# Patient Record
Sex: Male | Born: 1950 | Race: Black or African American | Hispanic: No | Marital: Married | Smoking: Never smoker
Health system: Southern US, Community
[De-identification: ages and names within clinical notes are randomized; demographics above are authoritative.]

## PROBLEM LIST (undated history)

## (undated) DIAGNOSIS — N186 End stage renal disease: Secondary | ICD-10-CM

## (undated) DIAGNOSIS — I5022 Chronic systolic (congestive) heart failure: Secondary | ICD-10-CM

## (undated) DIAGNOSIS — E669 Obesity, unspecified: Secondary | ICD-10-CM

## (undated) DIAGNOSIS — E119 Type 2 diabetes mellitus without complications: Secondary | ICD-10-CM

## (undated) DIAGNOSIS — I2510 Atherosclerotic heart disease of native coronary artery without angina pectoris: Secondary | ICD-10-CM

## (undated) DIAGNOSIS — D472 Monoclonal gammopathy: Secondary | ICD-10-CM

## (undated) DIAGNOSIS — I509 Heart failure, unspecified: Secondary | ICD-10-CM

## (undated) DIAGNOSIS — J45909 Unspecified asthma, uncomplicated: Secondary | ICD-10-CM

## (undated) DIAGNOSIS — R0600 Dyspnea, unspecified: Secondary | ICD-10-CM

## (undated) DIAGNOSIS — F32A Depression, unspecified: Secondary | ICD-10-CM

## (undated) DIAGNOSIS — R609 Edema, unspecified: Secondary | ICD-10-CM

## (undated) DIAGNOSIS — Z992 Dependence on renal dialysis: Secondary | ICD-10-CM

## (undated) DIAGNOSIS — N184 Chronic kidney disease, stage 4 (severe): Secondary | ICD-10-CM

## (undated) HISTORY — DX: End stage renal disease: Z99.2

## (undated) HISTORY — DX: Atherosclerotic heart disease of native coronary artery without angina pectoris: I25.10

## (undated) HISTORY — DX: Type 2 diabetes mellitus without complications: E11.9

## (undated) HISTORY — PX: ABCESS DRAINAGE: SHX399

## (undated) HISTORY — DX: Chronic systolic (congestive) heart failure: I50.22

## (undated) HISTORY — DX: Obesity, unspecified: E66.9

## (undated) HISTORY — DX: Unspecified asthma, uncomplicated: J45.909

## (undated) HISTORY — DX: Heart failure, unspecified: I50.9

## (undated) HISTORY — DX: Monoclonal gammopathy: D47.2

## (undated) HISTORY — DX: End stage renal disease: N18.6

## (undated) HISTORY — DX: Edema, unspecified: R60.9

## (undated) HISTORY — PX: COLONOSCOPY: SHX174

---

## 2015-06-12 DIAGNOSIS — J4521 Mild intermittent asthma with (acute) exacerbation: Secondary | ICD-10-CM | POA: Diagnosis not present

## 2015-06-12 DIAGNOSIS — I10 Essential (primary) hypertension: Secondary | ICD-10-CM | POA: Diagnosis not present

## 2015-06-12 DIAGNOSIS — E1121 Type 2 diabetes mellitus with diabetic nephropathy: Secondary | ICD-10-CM | POA: Diagnosis not present

## 2015-06-12 DIAGNOSIS — Z6841 Body Mass Index (BMI) 40.0 and over, adult: Secondary | ICD-10-CM | POA: Diagnosis not present

## 2015-09-11 DIAGNOSIS — Z0000 Encounter for general adult medical examination without abnormal findings: Secondary | ICD-10-CM | POA: Diagnosis not present

## 2015-09-11 DIAGNOSIS — Z125 Encounter for screening for malignant neoplasm of prostate: Secondary | ICD-10-CM | POA: Diagnosis not present

## 2015-09-11 DIAGNOSIS — E0821 Diabetes mellitus due to underlying condition with diabetic nephropathy: Secondary | ICD-10-CM | POA: Diagnosis not present

## 2015-09-11 DIAGNOSIS — Z6839 Body mass index (BMI) 39.0-39.9, adult: Secondary | ICD-10-CM | POA: Diagnosis not present

## 2015-09-11 DIAGNOSIS — I10 Essential (primary) hypertension: Secondary | ICD-10-CM | POA: Diagnosis not present

## 2015-12-11 DIAGNOSIS — Z6841 Body Mass Index (BMI) 40.0 and over, adult: Secondary | ICD-10-CM | POA: Diagnosis not present

## 2015-12-11 DIAGNOSIS — E0821 Diabetes mellitus due to underlying condition with diabetic nephropathy: Secondary | ICD-10-CM | POA: Diagnosis not present

## 2015-12-11 DIAGNOSIS — I10 Essential (primary) hypertension: Secondary | ICD-10-CM | POA: Diagnosis not present

## 2015-12-11 DIAGNOSIS — M1009 Idiopathic gout, multiple sites: Secondary | ICD-10-CM | POA: Diagnosis not present

## 2015-12-16 DIAGNOSIS — L400 Psoriasis vulgaris: Secondary | ICD-10-CM | POA: Diagnosis not present

## 2015-12-19 DIAGNOSIS — E0821 Diabetes mellitus due to underlying condition with diabetic nephropathy: Secondary | ICD-10-CM | POA: Diagnosis not present

## 2015-12-19 DIAGNOSIS — Z6841 Body Mass Index (BMI) 40.0 and over, adult: Secondary | ICD-10-CM | POA: Diagnosis not present

## 2015-12-19 DIAGNOSIS — I10 Essential (primary) hypertension: Secondary | ICD-10-CM | POA: Diagnosis not present

## 2015-12-19 DIAGNOSIS — D1779 Benign lipomatous neoplasm of other sites: Secondary | ICD-10-CM | POA: Diagnosis not present

## 2016-02-05 DIAGNOSIS — I10 Essential (primary) hypertension: Secondary | ICD-10-CM | POA: Diagnosis not present

## 2016-02-05 DIAGNOSIS — E0821 Diabetes mellitus due to underlying condition with diabetic nephropathy: Secondary | ICD-10-CM | POA: Diagnosis not present

## 2016-03-05 DIAGNOSIS — I10 Essential (primary) hypertension: Secondary | ICD-10-CM | POA: Diagnosis not present

## 2016-03-05 DIAGNOSIS — E0821 Diabetes mellitus due to underlying condition with diabetic nephropathy: Secondary | ICD-10-CM | POA: Diagnosis not present

## 2016-03-12 DIAGNOSIS — M1009 Idiopathic gout, multiple sites: Secondary | ICD-10-CM | POA: Diagnosis not present

## 2016-03-12 DIAGNOSIS — E0821 Diabetes mellitus due to underlying condition with diabetic nephropathy: Secondary | ICD-10-CM | POA: Diagnosis not present

## 2016-03-12 DIAGNOSIS — I10 Essential (primary) hypertension: Secondary | ICD-10-CM | POA: Diagnosis not present

## 2016-03-12 DIAGNOSIS — Z6841 Body Mass Index (BMI) 40.0 and over, adult: Secondary | ICD-10-CM | POA: Diagnosis not present

## 2016-04-05 DIAGNOSIS — M1712 Unilateral primary osteoarthritis, left knee: Secondary | ICD-10-CM | POA: Diagnosis not present

## 2016-04-05 DIAGNOSIS — T560X1D Toxic effect of lead and its compounds, accidental (unintentional), subsequent encounter: Secondary | ICD-10-CM | POA: Diagnosis not present

## 2016-04-09 DIAGNOSIS — E0821 Diabetes mellitus due to underlying condition with diabetic nephropathy: Secondary | ICD-10-CM | POA: Diagnosis not present

## 2016-04-09 DIAGNOSIS — I10 Essential (primary) hypertension: Secondary | ICD-10-CM | POA: Diagnosis not present

## 2016-05-06 DIAGNOSIS — I10 Essential (primary) hypertension: Secondary | ICD-10-CM | POA: Diagnosis not present

## 2016-05-06 DIAGNOSIS — E0821 Diabetes mellitus due to underlying condition with diabetic nephropathy: Secondary | ICD-10-CM | POA: Diagnosis not present

## 2016-06-04 DIAGNOSIS — I10 Essential (primary) hypertension: Secondary | ICD-10-CM | POA: Diagnosis not present

## 2016-06-04 DIAGNOSIS — E0821 Diabetes mellitus due to underlying condition with diabetic nephropathy: Secondary | ICD-10-CM | POA: Diagnosis not present

## 2016-06-11 DIAGNOSIS — M1009 Idiopathic gout, multiple sites: Secondary | ICD-10-CM | POA: Diagnosis not present

## 2016-06-11 DIAGNOSIS — Z6841 Body Mass Index (BMI) 40.0 and over, adult: Secondary | ICD-10-CM | POA: Diagnosis not present

## 2016-06-11 DIAGNOSIS — E0821 Diabetes mellitus due to underlying condition with diabetic nephropathy: Secondary | ICD-10-CM | POA: Diagnosis not present

## 2016-06-11 DIAGNOSIS — I10 Essential (primary) hypertension: Secondary | ICD-10-CM | POA: Diagnosis not present

## 2016-06-29 DIAGNOSIS — R0602 Shortness of breath: Secondary | ICD-10-CM | POA: Diagnosis not present

## 2016-06-29 DIAGNOSIS — E784 Other hyperlipidemia: Secondary | ICD-10-CM | POA: Diagnosis not present

## 2016-06-29 DIAGNOSIS — J45998 Other asthma: Secondary | ICD-10-CM | POA: Diagnosis not present

## 2016-06-29 DIAGNOSIS — R079 Chest pain, unspecified: Secondary | ICD-10-CM | POA: Diagnosis not present

## 2016-06-29 DIAGNOSIS — M1049 Other secondary gout, multiple sites: Secondary | ICD-10-CM | POA: Diagnosis not present

## 2016-06-29 DIAGNOSIS — I2699 Other pulmonary embolism without acute cor pulmonale: Secondary | ICD-10-CM | POA: Diagnosis not present

## 2016-06-30 DIAGNOSIS — E1122 Type 2 diabetes mellitus with diabetic chronic kidney disease: Secondary | ICD-10-CM | POA: Diagnosis not present

## 2016-06-30 DIAGNOSIS — N183 Chronic kidney disease, stage 3 (moderate): Secondary | ICD-10-CM | POA: Diagnosis not present

## 2016-06-30 DIAGNOSIS — M109 Gout, unspecified: Secondary | ICD-10-CM | POA: Diagnosis not present

## 2016-06-30 DIAGNOSIS — M1049 Other secondary gout, multiple sites: Secondary | ICD-10-CM | POA: Diagnosis not present

## 2016-06-30 DIAGNOSIS — J45909 Unspecified asthma, uncomplicated: Secondary | ICD-10-CM | POA: Diagnosis not present

## 2016-06-30 DIAGNOSIS — I129 Hypertensive chronic kidney disease with stage 1 through stage 4 chronic kidney disease, or unspecified chronic kidney disease: Secondary | ICD-10-CM | POA: Diagnosis not present

## 2016-06-30 DIAGNOSIS — R0602 Shortness of breath: Secondary | ICD-10-CM | POA: Diagnosis not present

## 2016-06-30 DIAGNOSIS — I2699 Other pulmonary embolism without acute cor pulmonale: Secondary | ICD-10-CM | POA: Diagnosis not present

## 2016-06-30 DIAGNOSIS — E784 Other hyperlipidemia: Secondary | ICD-10-CM | POA: Diagnosis not present

## 2016-06-30 DIAGNOSIS — E785 Hyperlipidemia, unspecified: Secondary | ICD-10-CM | POA: Diagnosis not present

## 2016-06-30 DIAGNOSIS — R0789 Other chest pain: Secondary | ICD-10-CM | POA: Diagnosis not present

## 2016-06-30 DIAGNOSIS — Z79899 Other long term (current) drug therapy: Secondary | ICD-10-CM | POA: Diagnosis not present

## 2016-06-30 DIAGNOSIS — J45998 Other asthma: Secondary | ICD-10-CM | POA: Diagnosis not present

## 2016-06-30 DIAGNOSIS — Z7982 Long term (current) use of aspirin: Secondary | ICD-10-CM | POA: Diagnosis not present

## 2016-07-08 DIAGNOSIS — I2609 Other pulmonary embolism with acute cor pulmonale: Secondary | ICD-10-CM | POA: Diagnosis not present

## 2016-07-08 DIAGNOSIS — I10 Essential (primary) hypertension: Secondary | ICD-10-CM | POA: Diagnosis not present

## 2016-07-08 DIAGNOSIS — M1009 Idiopathic gout, multiple sites: Secondary | ICD-10-CM | POA: Diagnosis not present

## 2016-07-08 DIAGNOSIS — Z6838 Body mass index (BMI) 38.0-38.9, adult: Secondary | ICD-10-CM | POA: Diagnosis not present

## 2016-08-04 DIAGNOSIS — E0821 Diabetes mellitus due to underlying condition with diabetic nephropathy: Secondary | ICD-10-CM | POA: Diagnosis not present

## 2016-08-04 DIAGNOSIS — I10 Essential (primary) hypertension: Secondary | ICD-10-CM | POA: Diagnosis not present

## 2016-10-08 DIAGNOSIS — I2609 Other pulmonary embolism with acute cor pulmonale: Secondary | ICD-10-CM | POA: Diagnosis not present

## 2016-10-08 DIAGNOSIS — I10 Essential (primary) hypertension: Secondary | ICD-10-CM | POA: Diagnosis not present

## 2016-10-08 DIAGNOSIS — M1009 Idiopathic gout, multiple sites: Secondary | ICD-10-CM | POA: Diagnosis not present

## 2016-10-08 DIAGNOSIS — Z6838 Body mass index (BMI) 38.0-38.9, adult: Secondary | ICD-10-CM | POA: Diagnosis not present

## 2016-10-08 DIAGNOSIS — N528 Other male erectile dysfunction: Secondary | ICD-10-CM | POA: Diagnosis not present

## 2016-10-08 DIAGNOSIS — E1165 Type 2 diabetes mellitus with hyperglycemia: Secondary | ICD-10-CM | POA: Diagnosis not present

## 2016-11-30 DIAGNOSIS — I10 Essential (primary) hypertension: Secondary | ICD-10-CM | POA: Diagnosis not present

## 2016-11-30 DIAGNOSIS — E0821 Diabetes mellitus due to underlying condition with diabetic nephropathy: Secondary | ICD-10-CM | POA: Diagnosis not present

## 2016-12-31 DIAGNOSIS — N528 Other male erectile dysfunction: Secondary | ICD-10-CM | POA: Diagnosis not present

## 2016-12-31 DIAGNOSIS — I10 Essential (primary) hypertension: Secondary | ICD-10-CM | POA: Diagnosis not present

## 2016-12-31 DIAGNOSIS — N184 Chronic kidney disease, stage 4 (severe): Secondary | ICD-10-CM | POA: Diagnosis not present

## 2016-12-31 DIAGNOSIS — Z6838 Body mass index (BMI) 38.0-38.9, adult: Secondary | ICD-10-CM | POA: Diagnosis not present

## 2016-12-31 DIAGNOSIS — M1009 Idiopathic gout, multiple sites: Secondary | ICD-10-CM | POA: Diagnosis not present

## 2016-12-31 DIAGNOSIS — E1165 Type 2 diabetes mellitus with hyperglycemia: Secondary | ICD-10-CM | POA: Diagnosis not present

## 2017-01-05 DIAGNOSIS — I10 Essential (primary) hypertension: Secondary | ICD-10-CM | POA: Diagnosis not present

## 2017-01-05 DIAGNOSIS — E1165 Type 2 diabetes mellitus with hyperglycemia: Secondary | ICD-10-CM | POA: Diagnosis not present

## 2017-01-05 DIAGNOSIS — N184 Chronic kidney disease, stage 4 (severe): Secondary | ICD-10-CM | POA: Diagnosis not present

## 2017-01-28 DIAGNOSIS — M25561 Pain in right knee: Secondary | ICD-10-CM | POA: Diagnosis not present

## 2017-04-01 DIAGNOSIS — I10 Essential (primary) hypertension: Secondary | ICD-10-CM | POA: Diagnosis not present

## 2017-04-01 DIAGNOSIS — Z0000 Encounter for general adult medical examination without abnormal findings: Secondary | ICD-10-CM | POA: Diagnosis not present

## 2017-04-01 DIAGNOSIS — N184 Chronic kidney disease, stage 4 (severe): Secondary | ICD-10-CM | POA: Diagnosis not present

## 2017-04-01 DIAGNOSIS — E1165 Type 2 diabetes mellitus with hyperglycemia: Secondary | ICD-10-CM | POA: Diagnosis not present

## 2017-04-01 DIAGNOSIS — E6609 Other obesity due to excess calories: Secondary | ICD-10-CM | POA: Diagnosis not present

## 2017-04-01 DIAGNOSIS — Z6839 Body mass index (BMI) 39.0-39.9, adult: Secondary | ICD-10-CM | POA: Diagnosis not present

## 2017-04-01 DIAGNOSIS — Z1389 Encounter for screening for other disorder: Secondary | ICD-10-CM | POA: Diagnosis not present

## 2017-04-01 DIAGNOSIS — N528 Other male erectile dysfunction: Secondary | ICD-10-CM | POA: Diagnosis not present

## 2017-04-01 DIAGNOSIS — M1009 Idiopathic gout, multiple sites: Secondary | ICD-10-CM | POA: Diagnosis not present

## 2017-06-29 DIAGNOSIS — I10 Essential (primary) hypertension: Secondary | ICD-10-CM | POA: Diagnosis not present

## 2017-06-29 DIAGNOSIS — E119 Type 2 diabetes mellitus without complications: Secondary | ICD-10-CM | POA: Diagnosis not present

## 2017-06-29 DIAGNOSIS — E6609 Other obesity due to excess calories: Secondary | ICD-10-CM | POA: Diagnosis not present

## 2017-06-29 DIAGNOSIS — N528 Other male erectile dysfunction: Secondary | ICD-10-CM | POA: Diagnosis not present

## 2017-06-29 DIAGNOSIS — N184 Chronic kidney disease, stage 4 (severe): Secondary | ICD-10-CM | POA: Diagnosis not present

## 2017-06-29 DIAGNOSIS — M1009 Idiopathic gout, multiple sites: Secondary | ICD-10-CM | POA: Diagnosis not present

## 2017-06-29 DIAGNOSIS — Z6839 Body mass index (BMI) 39.0-39.9, adult: Secondary | ICD-10-CM | POA: Diagnosis not present

## 2017-08-19 DIAGNOSIS — M79641 Pain in right hand: Secondary | ICD-10-CM | POA: Diagnosis not present

## 2017-08-31 DIAGNOSIS — M79672 Pain in left foot: Secondary | ICD-10-CM | POA: Diagnosis not present

## 2017-08-31 DIAGNOSIS — R7989 Other specified abnormal findings of blood chemistry: Secondary | ICD-10-CM | POA: Diagnosis not present

## 2017-08-31 DIAGNOSIS — N2889 Other specified disorders of kidney and ureter: Secondary | ICD-10-CM | POA: Diagnosis not present

## 2017-08-31 DIAGNOSIS — E1122 Type 2 diabetes mellitus with diabetic chronic kidney disease: Secondary | ICD-10-CM | POA: Diagnosis not present

## 2017-08-31 DIAGNOSIS — R1011 Right upper quadrant pain: Secondary | ICD-10-CM | POA: Diagnosis not present

## 2017-08-31 DIAGNOSIS — E270 Other adrenocortical overactivity: Secondary | ICD-10-CM | POA: Diagnosis not present

## 2017-08-31 DIAGNOSIS — M109 Gout, unspecified: Secondary | ICD-10-CM | POA: Diagnosis not present

## 2017-08-31 DIAGNOSIS — M1009 Idiopathic gout, multiple sites: Secondary | ICD-10-CM | POA: Diagnosis not present

## 2017-08-31 DIAGNOSIS — R109 Unspecified abdominal pain: Secondary | ICD-10-CM | POA: Diagnosis not present

## 2017-08-31 DIAGNOSIS — M7989 Other specified soft tissue disorders: Secondary | ICD-10-CM | POA: Diagnosis not present

## 2017-08-31 DIAGNOSIS — I161 Hypertensive emergency: Secondary | ICD-10-CM | POA: Diagnosis not present

## 2017-09-01 DIAGNOSIS — E279 Disorder of adrenal gland, unspecified: Secondary | ICD-10-CM | POA: Diagnosis not present

## 2017-09-01 DIAGNOSIS — E785 Hyperlipidemia, unspecified: Secondary | ICD-10-CM | POA: Diagnosis not present

## 2017-09-01 DIAGNOSIS — M7989 Other specified soft tissue disorders: Secondary | ICD-10-CM | POA: Diagnosis not present

## 2017-09-01 DIAGNOSIS — Z79899 Other long term (current) drug therapy: Secondary | ICD-10-CM | POA: Diagnosis not present

## 2017-09-01 DIAGNOSIS — R109 Unspecified abdominal pain: Secondary | ICD-10-CM | POA: Diagnosis not present

## 2017-09-01 DIAGNOSIS — Z6839 Body mass index (BMI) 39.0-39.9, adult: Secondary | ICD-10-CM | POA: Diagnosis not present

## 2017-09-01 DIAGNOSIS — E1122 Type 2 diabetes mellitus with diabetic chronic kidney disease: Secondary | ICD-10-CM | POA: Diagnosis not present

## 2017-09-01 DIAGNOSIS — E270 Other adrenocortical overactivity: Secondary | ICD-10-CM | POA: Diagnosis not present

## 2017-09-01 DIAGNOSIS — Z86711 Personal history of pulmonary embolism: Secondary | ICD-10-CM | POA: Diagnosis not present

## 2017-09-01 DIAGNOSIS — M79672 Pain in left foot: Secondary | ICD-10-CM | POA: Diagnosis not present

## 2017-09-01 DIAGNOSIS — Z7982 Long term (current) use of aspirin: Secondary | ICD-10-CM | POA: Diagnosis not present

## 2017-09-01 DIAGNOSIS — Z794 Long term (current) use of insulin: Secondary | ICD-10-CM | POA: Diagnosis not present

## 2017-09-01 DIAGNOSIS — M109 Gout, unspecified: Secondary | ICD-10-CM | POA: Diagnosis not present

## 2017-09-01 DIAGNOSIS — I129 Hypertensive chronic kidney disease with stage 1 through stage 4 chronic kidney disease, or unspecified chronic kidney disease: Secondary | ICD-10-CM | POA: Diagnosis not present

## 2017-09-01 DIAGNOSIS — I161 Hypertensive emergency: Secondary | ICD-10-CM | POA: Diagnosis not present

## 2017-09-01 DIAGNOSIS — Z7901 Long term (current) use of anticoagulants: Secondary | ICD-10-CM | POA: Diagnosis not present

## 2017-09-01 DIAGNOSIS — J4540 Moderate persistent asthma, uncomplicated: Secondary | ICD-10-CM | POA: Diagnosis not present

## 2017-09-01 DIAGNOSIS — R7989 Other specified abnormal findings of blood chemistry: Secondary | ICD-10-CM | POA: Diagnosis not present

## 2017-09-01 DIAGNOSIS — N2889 Other specified disorders of kidney and ureter: Secondary | ICD-10-CM | POA: Diagnosis not present

## 2017-09-01 DIAGNOSIS — N183 Chronic kidney disease, stage 3 (moderate): Secondary | ICD-10-CM | POA: Diagnosis not present

## 2017-09-01 DIAGNOSIS — M1009 Idiopathic gout, multiple sites: Secondary | ICD-10-CM | POA: Diagnosis not present

## 2017-09-05 DIAGNOSIS — I10 Essential (primary) hypertension: Secondary | ICD-10-CM | POA: Diagnosis not present

## 2017-09-05 DIAGNOSIS — Z6839 Body mass index (BMI) 39.0-39.9, adult: Secondary | ICD-10-CM | POA: Diagnosis not present

## 2017-09-08 ENCOUNTER — Other Ambulatory Visit: Payer: Self-pay

## 2017-09-08 NOTE — Patient Outreach (Signed)
[facility]) Care Management  09/08/2017  Gemini Beaumier [birth year] [id number]  Transition of care  Referral date: 09/07/17 Referral source: discharged from [facility] on 09/03/17 Insurance: [employer]  Telephone call to patient regarding transition of care referral. HIPAA verified with patient.  Explained reason for call. Patient states he was recently in the hospital due to sharp pain in his right side. Patient states he had a follow up appointment with his primary MD on 09/05/17. States his doctor went over his medications with him.   Patient states his doctor is waiting for his test result to come in then he will schedule another follow up visit.  Patient states he is doing ok at this time.   Patient states he has transportation to his appointments.  RNCM advised patient to take his medications as prescribed.  RNCM advised patient to notify MD of any changes in condition prior to scheduled appointment. RNCM provided contact name and number: [phone] or main office number [phone] and 24 hour nurse advise line [phone].  RNCM verified patient aware of 911 services for urgent/ emergent needs., Patient denies need for ongoing transition of care follow up.    PLAN:   RNCM will close patient due to refusal of services.  RNCM will send patients primary MD closure notification  RNCM will send patient [facility] care management brochure  Quinn Plowman RN,BSN,CCM [facility] Telephonic  [phone]

## 2017-09-28 DIAGNOSIS — Z6837 Body mass index (BMI) 37.0-37.9, adult: Secondary | ICD-10-CM | POA: Diagnosis not present

## 2017-09-28 DIAGNOSIS — I10 Essential (primary) hypertension: Secondary | ICD-10-CM | POA: Diagnosis not present

## 2017-09-28 DIAGNOSIS — D1779 Benign lipomatous neoplasm of other sites: Secondary | ICD-10-CM | POA: Diagnosis not present

## 2017-09-28 DIAGNOSIS — I2609 Other pulmonary embolism with acute cor pulmonale: Secondary | ICD-10-CM | POA: Diagnosis not present

## 2017-09-28 DIAGNOSIS — N183 Chronic kidney disease, stage 3 (moderate): Secondary | ICD-10-CM | POA: Diagnosis not present

## 2017-09-28 DIAGNOSIS — E1121 Type 2 diabetes mellitus with diabetic nephropathy: Secondary | ICD-10-CM | POA: Diagnosis not present

## 2017-10-03 DIAGNOSIS — R1084 Generalized abdominal pain: Secondary | ICD-10-CM | POA: Diagnosis not present

## 2017-10-03 DIAGNOSIS — E278 Other specified disorders of adrenal gland: Secondary | ICD-10-CM | POA: Diagnosis not present

## 2017-10-03 DIAGNOSIS — R1011 Right upper quadrant pain: Secondary | ICD-10-CM | POA: Diagnosis not present

## 2017-10-03 DIAGNOSIS — N289 Disorder of kidney and ureter, unspecified: Secondary | ICD-10-CM | POA: Diagnosis not present

## 2017-10-12 DIAGNOSIS — Z6838 Body mass index (BMI) 38.0-38.9, adult: Secondary | ICD-10-CM | POA: Diagnosis not present

## 2017-10-12 DIAGNOSIS — M150 Primary generalized (osteo)arthritis: Secondary | ICD-10-CM | POA: Diagnosis not present

## 2017-10-12 DIAGNOSIS — L409 Psoriasis, unspecified: Secondary | ICD-10-CM | POA: Diagnosis not present

## 2017-10-12 DIAGNOSIS — M2550 Pain in unspecified joint: Secondary | ICD-10-CM | POA: Diagnosis not present

## 2017-10-12 DIAGNOSIS — E669 Obesity, unspecified: Secondary | ICD-10-CM | POA: Diagnosis not present

## 2017-10-12 DIAGNOSIS — M1A9XX1 Chronic gout, unspecified, with tophus (tophi): Secondary | ICD-10-CM | POA: Diagnosis not present

## 2017-10-17 DIAGNOSIS — I10 Essential (primary) hypertension: Secondary | ICD-10-CM | POA: Diagnosis not present

## 2017-10-17 DIAGNOSIS — E1121 Type 2 diabetes mellitus with diabetic nephropathy: Secondary | ICD-10-CM | POA: Diagnosis not present

## 2017-11-11 DIAGNOSIS — M1A9XX1 Chronic gout, unspecified, with tophus (tophi): Secondary | ICD-10-CM | POA: Diagnosis not present

## 2017-12-02 DIAGNOSIS — M25562 Pain in left knee: Secondary | ICD-10-CM | POA: Diagnosis not present

## 2017-12-12 DIAGNOSIS — E669 Obesity, unspecified: Secondary | ICD-10-CM | POA: Diagnosis not present

## 2017-12-12 DIAGNOSIS — Z79899 Other long term (current) drug therapy: Secondary | ICD-10-CM | POA: Diagnosis not present

## 2017-12-12 DIAGNOSIS — M150 Primary generalized (osteo)arthritis: Secondary | ICD-10-CM | POA: Diagnosis not present

## 2017-12-12 DIAGNOSIS — M2550 Pain in unspecified joint: Secondary | ICD-10-CM | POA: Diagnosis not present

## 2017-12-12 DIAGNOSIS — Z6838 Body mass index (BMI) 38.0-38.9, adult: Secondary | ICD-10-CM | POA: Diagnosis not present

## 2017-12-12 DIAGNOSIS — M1A9XX1 Chronic gout, unspecified, with tophus (tophi): Secondary | ICD-10-CM | POA: Diagnosis not present

## 2017-12-12 DIAGNOSIS — L409 Psoriasis, unspecified: Secondary | ICD-10-CM | POA: Diagnosis not present

## 2017-12-29 DIAGNOSIS — Z6837 Body mass index (BMI) 37.0-37.9, adult: Secondary | ICD-10-CM | POA: Diagnosis not present

## 2017-12-29 DIAGNOSIS — N183 Chronic kidney disease, stage 3 (moderate): Secondary | ICD-10-CM | POA: Diagnosis not present

## 2017-12-29 DIAGNOSIS — Z0000 Encounter for general adult medical examination without abnormal findings: Secondary | ICD-10-CM | POA: Diagnosis not present

## 2017-12-29 DIAGNOSIS — E1121 Type 2 diabetes mellitus with diabetic nephropathy: Secondary | ICD-10-CM | POA: Diagnosis not present

## 2017-12-29 DIAGNOSIS — I2609 Other pulmonary embolism with acute cor pulmonale: Secondary | ICD-10-CM | POA: Diagnosis not present

## 2017-12-29 DIAGNOSIS — I10 Essential (primary) hypertension: Secondary | ICD-10-CM | POA: Diagnosis not present

## 2017-12-29 DIAGNOSIS — D1779 Benign lipomatous neoplasm of other sites: Secondary | ICD-10-CM | POA: Diagnosis not present

## 2018-01-10 DIAGNOSIS — M1A9XX1 Chronic gout, unspecified, with tophus (tophi): Secondary | ICD-10-CM | POA: Diagnosis not present

## 2018-02-09 DIAGNOSIS — I10 Essential (primary) hypertension: Secondary | ICD-10-CM | POA: Diagnosis not present

## 2018-02-09 DIAGNOSIS — E1121 Type 2 diabetes mellitus with diabetic nephropathy: Secondary | ICD-10-CM | POA: Diagnosis not present

## 2018-03-14 DIAGNOSIS — M150 Primary generalized (osteo)arthritis: Secondary | ICD-10-CM | POA: Diagnosis not present

## 2018-03-14 DIAGNOSIS — Z79899 Other long term (current) drug therapy: Secondary | ICD-10-CM | POA: Diagnosis not present

## 2018-03-14 DIAGNOSIS — L409 Psoriasis, unspecified: Secondary | ICD-10-CM | POA: Diagnosis not present

## 2018-03-14 DIAGNOSIS — M1A9XX1 Chronic gout, unspecified, with tophus (tophi): Secondary | ICD-10-CM | POA: Diagnosis not present

## 2018-03-14 DIAGNOSIS — M2550 Pain in unspecified joint: Secondary | ICD-10-CM | POA: Diagnosis not present

## 2018-03-14 DIAGNOSIS — Z6841 Body Mass Index (BMI) 40.0 and over, adult: Secondary | ICD-10-CM | POA: Diagnosis not present

## 2018-03-23 DIAGNOSIS — E1121 Type 2 diabetes mellitus with diabetic nephropathy: Secondary | ICD-10-CM | POA: Diagnosis not present

## 2018-03-23 DIAGNOSIS — I10 Essential (primary) hypertension: Secondary | ICD-10-CM | POA: Diagnosis not present

## 2018-04-03 DIAGNOSIS — Z0000 Encounter for general adult medical examination without abnormal findings: Secondary | ICD-10-CM | POA: Diagnosis not present

## 2018-04-03 DIAGNOSIS — I2690 Septic pulmonary embolism without acute cor pulmonale: Secondary | ICD-10-CM | POA: Diagnosis not present

## 2018-04-03 DIAGNOSIS — N183 Chronic kidney disease, stage 3 (moderate): Secondary | ICD-10-CM | POA: Diagnosis not present

## 2018-04-03 DIAGNOSIS — Z6841 Body Mass Index (BMI) 40.0 and over, adult: Secondary | ICD-10-CM | POA: Diagnosis not present

## 2018-04-03 DIAGNOSIS — Z1389 Encounter for screening for other disorder: Secondary | ICD-10-CM | POA: Diagnosis not present

## 2018-04-03 DIAGNOSIS — I10 Essential (primary) hypertension: Secondary | ICD-10-CM | POA: Diagnosis not present

## 2018-04-03 DIAGNOSIS — E1121 Type 2 diabetes mellitus with diabetic nephropathy: Secondary | ICD-10-CM | POA: Diagnosis not present

## 2018-04-24 ENCOUNTER — Encounter: Payer: Self-pay | Admitting: Internal Medicine

## 2018-04-24 ENCOUNTER — Ambulatory Visit: Payer: PPO | Admitting: Internal Medicine

## 2018-04-24 VITALS — BP 168/82 | HR 84 | Ht 72.0 in | Wt 302.4 lb

## 2018-04-24 DIAGNOSIS — I10 Essential (primary) hypertension: Secondary | ICD-10-CM

## 2018-04-24 DIAGNOSIS — R0609 Other forms of dyspnea: Secondary | ICD-10-CM

## 2018-04-24 DIAGNOSIS — I2699 Other pulmonary embolism without acute cor pulmonale: Secondary | ICD-10-CM | POA: Diagnosis not present

## 2018-04-24 MED ORDER — BISOPROLOL FUMARATE 5 MG PO TABS: ORAL_TABLET | ORAL | 11 refills | Status: DC

## 2018-04-24 NOTE — Assessment & Plan Note (Addendum)
Body mass index is 41.01 kg/m.   No results found for: TSH   Contributing to gerd risk/ doe/reviewed the need and the process to achieve and maintain neg calorie balance > defer f/u primary care including intermittently monitoring thyroid status     Total time devoted to counseling  > 50 % of initial 60 min office visit:  review case with pt/directly observed amb 02 sat study/  discussion of options/alternatives/ personally creating written customized instructions  in presence of pt  then going over those specific  Instructions directly with the pt including how to use all of the meds but in particular covering each new medication in detail and the difference between the maintenance= "automatic" meds and the prns using an action plan format for the latter (If this problem/symptom => do that organization reading Left to right).  Please see AVS from this visit for a full list of these instructions which I personally wrote for this pt and  are unique to this visit.

## 2018-04-24 NOTE — Progress Notes (Signed)
[name] [name], male    DOB: Sep 21, 1950,    MRN: [id number]    Brief patient profile:  45 yobm never smoker  With asthma as child never able to play to hs sports maint on advair as adult and obesity complicated by dm/ hyperlipidemia/hbp plus  problem with with L >R leg swelling x decades then pain in L calf 06/2016 then 4 days later sweating / cp/sob when went to resume Y >   admit [facility] dx L dvt / PE and doe ever since s improvement so referred to pulmonary clinic 04/24/2018 by Dr   Telford Nab   History of Present Illness  04/24/2018  Pulmonary/ 1st office eval/  Chief Complaint  Patient presents with  . Pulmonary Consult    Referred by Dr. Clide Deutscher.  Pt c/o SOB since dxed with PE in March 2018.  He gets winded walking approx 40 yards.  Dyspnea:  70 ft from kitchen door uphill to mb now has to go slower than used to no change in wt sinece  Cough: no Sleep: on L side / flat bed/ sleeps on 30 degree wedge  SABA use: no inhalers   No obvious day to day or daytime variability or assoc excess/ purulent sputum or mucus plugs or hemoptysis or cp or chest tightness, subjective wheeze or overt sinus or hb symptoms.   Sleeping as above  without nocturnal  or early am exacerbation  of respiratory  c/o's or need for noct saba. Also denies any obvious fluctuation of symptoms with weather or environmental changes or other aggravating or alleviating factors except as outlined above   No unusual exposure hx or h/o childhood pna  or knowledge of premature birth.  Current Allergies, Complete Past Medical History, Past Surgical History, Family History, and Social History were reviewed in [employer] record.  ROS  The following are not active complaints unless bolded Hoarseness, sore throat, dysphagia, dental problems, itching, sneezing,  nasal congestion or discharge of excess mucus or purulent secretions, ear ache,   fever, chills, sweats, unintended wt loss or wt gain,  classically pleuritic or exertional cp,  orthopnea pnd or arm/hand swelling  or leg swelling, presyncope, palpitations, abdominal pain, anorexia, nausea, vomiting, diarrhea  or change in bowel habits or change in bladder habits, change in stools or change in urine, dysuria, hematuria,  rash, arthralgias, visual complaints, headache, numbness, weakness or ataxia or problems with walking or coordination,  change in mood or  memory.            No past medical history on file.  Outpatient Medications Prior to Visit  Medication Sig Dispense Refill  . allopurinol (ZYLOPRIM) 300 MG tablet Take 300 mg by mouth daily.    Marland Kitchen apixaban (ELIQUIS) 2.5 MG TABS tablet Take 2.5 mg by mouth 2 (two) times daily.    Marland Kitchen atorvastatin (LIPITOR) 20 MG tablet Take 20 mg by mouth daily.    . chlorthalidone (HYGROTON) 25 MG tablet Take 25 mg by mouth daily.    . empagliflozin (JARDIANCE) 25 MG TABS tablet Take 25 mg by mouth daily.    . Fluticasone-Salmeterol (ADVAIR) 250-50 MCG/DOSE AEPB Inhale 1 puff into the lungs 2 (two) times daily.    Marland Kitchen glimepiride (AMARYL) 4 MG tablet Take 4 mg by mouth daily with breakfast.    . hydrALAZINE (APRESOLINE) 50 MG tablet Take 50 mg by mouth. Patient states he takes 1 tablet 2 times per day    . insulin aspart (NOVOLOG)  100 UNIT/ML injection Inject into the skin 3 (three) times daily with meals. Patient states takes 5 units before meals    . insulin detemir (LEVEMIR) 100 UNIT/ML injection Inject into the skin at bedtime. Patient states he takes 10 units at bedtime.    . isosorbide mononitrate (IMDUR) 30 MG 24 hr tablet Take 30 mg by mouth daily.    Marland Kitchen labetalol (NORMODYNE) 300 MG tablet Take 300 mg by mouth 2 (two) times daily.    . Omega-3 Fatty Acids (FISH OIL) 1200 MG CAPS Take 1,200 mg by mouth. Patient states 1 tablet 1 time per day    . potassium chloride SA (K-DUR,KLOR-CON) 20 MEQ tablet Take 20 mEq by mouth daily.    . colchicine 0.6 MG tablet Take 0.6 mg by mouth daily. As  needed for gout        Objective:     BP (!) 168/82 (BP Location: Left Arm, Cuff Size: Normal)   Pulse 84   Ht 6' (1.829 m)   Wt (!) 302 lb 6.4 oz (137.2 kg)   SpO2 97%   BMI 41.01 kg/m   SpO2: 97 %  RA   Amb bm mild pseudowheeze on fvc, eliminated with plb     HEENT: Partial plates , turbinates bilaterally, and oropharynx. Nl external ear canals without cough reflex   NECK :  without JVD/Nodes/TM/ nl carotid upstrokes bilaterally   LUNGS: no acc muscle use,  Nl contour chest which is clear to A and P bilaterally without cough on insp or exp maneuvers   CV:  RRR  no s3 or murmur or increase in P2, and massive swelling both LEs L >R   ABD:  Quite obese but  soft and nontender with nl inspiratory excursion in the supine position. No bruits or organomegaly appreciated, bowel sounds nl  MS:  Nl gait/ ext warm without deformities, calf tenderness, cyanosis or clubbing No obvious joint restrictions   SKIN: warm and dry with elephantiasis skin changes bilateral LE's     NEURO:  alert, approp, nl sensorium with  no motor or cerebellar deficits apparent.         Assessment  DOE (dyspnea on exertion) 04/24/2018   Walked RA  2 laps @ 23ft each @ avg  pace  stopped due to  End of study,  Min sob, no desats  - Spirometry 04/24/2018  FEV1 2.2 (70%)  Ratio 72 mild curvature p am advair 250 while on normodyne    Symptoms are  disproportionate to objective findings and not clear to what extent this is actually a pulmonary  problem but pt does appear to have difficult to sort out respiratory symptoms of unknown origin for which  DDX  = almost all start with A and  include Adherence, Ace Inhibitors, Acid Reflux, Active Sinus Disease, Alpha 1 Antitripsin deficiency, Anxiety masquerading as Airways dz,  ABPA,  Allergy(esp in young), Aspiration (esp in elderly), Adverse effects of meds,  Active smoking or Vaping, A bunch of PE's/clot burden (a few small clots can't cause this syndrome  unless there is already severe underlying pulm or vascular dz with poor reserve),  Anemia or thyroid disorder, plus two Bs  = Bronchiectasis and Beta blocker use..and one C= CHF   Adherence is always the initial "prime suspect" and is a multilayered concern that requires a "trust but verify" approach in every patient - starting with knowing how to use medications, especially inhalers, correctly, keeping up with refills and understanding the fundamental  difference between maintenance and prns vs those medications only taken for a very short course and then stopped and not refilled.  - rec return with all meds in hand using a trust but verify approach to confirm accurate Medication  Reconciliation The principal here is that until we are certain that the  patients are doing what we've asked, it makes no sense to ask them to do more.   ? Adverse effects of advair > consider hfa on return if not improving off normodyne  ? Allergy/ asthma >  No nocturnal symptoms or assoc rhinitis but need to keep in ddx if becomes refractory to suggestions  ? Beta blocker effects > see hbp  ? A bunch of PE's > continue eliquis but recheck LE dopplers  ? chf / ? TEPAH > check echo      Essential hypertension In the setting of respiratory symptoms of unknown etiology,  It would be preferable to use bystolic, the most beta -1  selective Beta blocker available in sample form, with bisoprolol the most selective generic choice  on the market, at least on a trial basis, to make sure the spillover Beta 2 effects of the less specific Beta blockers are not contributing to this patient's symptoms.   >>> His bp is not really optimally  controlled anyway so rec change normodyne to bisoprolol 5 mg bid and f/u with PCP rec      Pulmonary embolism ([place]) Dx 06/2016  [facility]   Main risk factors are MO and chronic venous insufficiency .  If echo nl and no evidence of dvt rec continue the lower dose indefinitely but  keep in mind that pts in this wt range were excluded from the studies so low threshold to change to the higher dose DOAC or coumadin if any evidence at all to support  ongoing or recurrent clotting disoder here.  >>>   recheck venous dopplers/ echo     Morbid obesity due to excess calories ([place]) Body mass index is 41.01 kg/m.    No results found for: TSH   Contributing to gerd risk/ doe/reviewed the need and the process to achieve and maintain neg calorie balance > defer f/u primary care including intermittently monitoring thyroid status       Total time devoted to counseling  > 50 % of initial 60 min office visit:  review case with pt/directly observed amb 02 sat study/  discussion of options/alternatives/ personally creating written customized instructions  in presence of pt  then going over those specific  Instructions directly with the pt including how to use all of the meds but in particular covering each new medication in detail and the difference between the maintenance= "automatic" meds and the prns using an action plan format for the latter (If this problem/symptom => do that organization reading Left to right).  Please see AVS from this visit for a full list of these instructions which I personally wrote for this pt and  are unique to this visit.     Christinia Gully, MD 04/24/2018

## 2018-04-24 NOTE — Patient Instructions (Signed)
Stop normodyne  (labetolol) and start bisoprolol 5 mg twice daily instead   No other changes in medications   Please see patient coordinator before you leave today  to schedule venous dopplers and echo at [facility] office and our cardiologists can read them and send them to me and I'll call with results   Please schedule a follow up office visit in 4 weeks, sooner if needed with pfts on return

## 2018-04-25 ENCOUNTER — Encounter: Payer: Self-pay | Admitting: Internal Medicine

## 2018-04-25 LAB — CBC WITH DIFFERENTIAL/PLATELET
Basophils Absolute: 0 10*3/uL (ref 0.0–0.1)
Basophils Relative: 1 % (ref 0.0–3.0)
EOS ABS: 0.3 10*3/uL (ref 0.0–0.7)
Eosinophils Relative: 5.2 % — ABNORMAL HIGH (ref 0.0–5.0)
HCT: 38.5 % — ABNORMAL LOW (ref 39.0–52.0)
Hemoglobin: 12.3 g/dL — ABNORMAL LOW (ref 13.0–17.0)
LYMPHS ABS: 1.4 10*3/uL (ref 0.7–4.0)
Lymphocytes Relative: 27.4 % (ref 12.0–46.0)
MCHC: 32 g/dL (ref 30.0–36.0)
MCV: 90 fl (ref 78.0–100.0)
MONO ABS: 0.4 10*3/uL (ref 0.1–1.0)
Monocytes Relative: 7.4 % (ref 3.0–12.0)
Neutro Abs: 3 10*3/uL (ref 1.4–7.7)
Neutrophils Relative %: 59 % (ref 43.0–77.0)
Platelets: 221 10*3/uL (ref 150.0–400.0)
RBC: 4.28 Mil/uL (ref 4.22–5.81)
RDW: 16.7 % — ABNORMAL HIGH (ref 11.5–15.5)
WBC: 5 10*3/uL (ref 4.0–10.5)

## 2018-04-25 LAB — BASIC METABOLIC PANEL
BUN: 60 mg/dL — AB (ref 6–23)
CO2: 25 mEq/L (ref 19–32)
Calcium: 9.5 mg/dL (ref 8.4–10.5)
Chloride: 106 mEq/L (ref 96–112)
Creatinine, Ser: 2.51 mg/dL — ABNORMAL HIGH (ref 0.40–1.50)
GFR: 33.04 mL/min — ABNORMAL LOW (ref >60.00)
Glucose, Bld: 112 mg/dL — ABNORMAL HIGH (ref 70–99)
Potassium: 3.7 mEq/L (ref 3.5–5.1)
Sodium: 141 mEq/L (ref 135–145)

## 2018-04-25 LAB — TSH: TSH: 1.96 u[IU]/mL (ref 0.35–4.50)

## 2018-04-25 LAB — BRAIN NATRIURETIC PEPTIDE: Pro B Natriuretic peptide (BNP): 37 pg/mL (ref 0.0–100.0)

## 2018-04-25 NOTE — Assessment & Plan Note (Signed)
Dx 06/2016  [facility]   Main risk factors are MO and chronic venous insufficiency .  If echo nl and no evidence of dvt rec continue the lower dose indefinitely but keep in mind that pts in this wt range were excluded from the studies so low threshold to change to the higher dose DOAC or coumadin if any evidence at all to support  ongoing or recurrent clotting disoder here.  >>>   recheck venous dopplers/ echo

## 2018-04-25 NOTE — Assessment & Plan Note (Signed)
04/24/2018   Walked RA  2 laps @ 248ft each @ avg  pace  stopped due to  End of study,  Min sob, no desats  - Spirometry 04/24/2018  FEV1 2.2 (70%)  Ratio 72 mild curvature p am advair 250 while on normodyne    Symptoms are  disproportionate to objective findings and not clear to what extent this is actually a pulmonary  problem but pt does appear to have difficult to sort out respiratory symptoms of unknown origin for which  DDX  = almost all start with A and  include Adherence, Ace Inhibitors, Acid Reflux, Active Sinus Disease, Alpha 1 Antitripsin deficiency, Anxiety masquerading as Airways dz,  ABPA,  Allergy(esp in young), Aspiration (esp in elderly), Adverse effects of meds,  Active smoking or Vaping, A bunch of PE's/clot burden (a few small clots can't cause this syndrome unless there is already severe underlying pulm or vascular dz with poor reserve),  Anemia or thyroid disorder, plus two Bs  = Bronchiectasis and Beta blocker use..and one C= CHF   Adherence is always the initial "prime suspect" and is a multilayered concern that requires a "trust but verify" approach in every patient - starting with knowing how to use medications, especially inhalers, correctly, keeping up with refills and understanding the fundamental difference between maintenance and prns vs those medications only taken for a very short course and then stopped and not refilled.  - rec return with all meds in hand using a trust but verify approach to confirm accurate Medication  Reconciliation The principal here is that until we are certain that the  patients are doing what we've asked, it makes no sense to ask them to do more.   ? Adverse effects of advair > consider hfa on return if not improving off normodyne  ? Allergy/ asthma >  No nocturnal symptoms or assoc rhinitis but need to keep in ddx if becomes refractory to suggestions  ? Beta blocker effects > see hbp  ? A bunch of PE's > continue eliquis but recheck LE  dopplers  ? chf / ? TEPAH > check echo

## 2018-04-25 NOTE — Assessment & Plan Note (Signed)
In the setting of respiratory symptoms of unknown etiology,  It would be preferable to use bystolic, the most beta -1  selective Beta blocker available in sample form, with bisoprolol the most selective generic choice  on the market, at least on a trial basis, to make sure the spillover Beta 2 effects of the less specific Beta blockers are not contributing to this patient's symptoms.   >>> His bp is not really optimally  controlled anyway so rec change normodyne to bisoprolol 5 mg bid and f/u with PCP rec

## 2018-04-25 NOTE — Progress Notes (Signed)
LMTCB

## 2018-04-26 ENCOUNTER — Telehealth: Payer: Self-pay | Admitting: Internal Medicine

## 2018-04-26 ENCOUNTER — Other Ambulatory Visit: Payer: Self-pay | Admitting: Internal Medicine

## 2018-04-26 DIAGNOSIS — M79662 Pain in left lower leg: Secondary | ICD-10-CM

## 2018-04-26 DIAGNOSIS — I2699 Other pulmonary embolism without acute cor pulmonale: Secondary | ICD-10-CM

## 2018-04-26 DIAGNOSIS — M7989 Other specified soft tissue disorders: Secondary | ICD-10-CM

## 2018-04-26 NOTE — Telephone Encounter (Signed)
Advised pt of results. Pt understood and nothing further is needed. I will fax the results to Dr. Romona Curls at [phone]   Notes recorded by Tanda Rockers, MD on 04/25/2018 at 1:27 PM EST Call patient : Studies are unremarkable except kidney function is reduced > send copy to pcp to make sure this doesn't represent a change in baseline

## 2018-04-27 NOTE — Progress Notes (Signed)
Pt was notified of results

## 2018-05-03 DIAGNOSIS — E1121 Type 2 diabetes mellitus with diabetic nephropathy: Secondary | ICD-10-CM | POA: Diagnosis not present

## 2018-05-03 DIAGNOSIS — N183 Chronic kidney disease, stage 3 (moderate): Secondary | ICD-10-CM | POA: Diagnosis not present

## 2018-05-03 DIAGNOSIS — I10 Essential (primary) hypertension: Secondary | ICD-10-CM | POA: Diagnosis not present

## 2018-05-10 ENCOUNTER — Ambulatory Visit (INDEPENDENT_AMBULATORY_CARE_PROVIDER_SITE_OTHER): Payer: PPO

## 2018-05-10 ENCOUNTER — Other Ambulatory Visit: Payer: Self-pay

## 2018-05-10 DIAGNOSIS — M7989 Other specified soft tissue disorders: Secondary | ICD-10-CM | POA: Diagnosis not present

## 2018-05-10 DIAGNOSIS — I2699 Other pulmonary embolism without acute cor pulmonale: Secondary | ICD-10-CM

## 2018-05-10 DIAGNOSIS — M79662 Pain in left lower leg: Secondary | ICD-10-CM

## 2018-05-10 DIAGNOSIS — R0609 Other forms of dyspnea: Secondary | ICD-10-CM | POA: Diagnosis not present

## 2018-05-19 ENCOUNTER — Other Ambulatory Visit: Payer: Self-pay | Admitting: Internal Medicine

## 2018-05-19 DIAGNOSIS — R0609 Other forms of dyspnea: Principal | ICD-10-CM

## 2018-05-22 ENCOUNTER — Ambulatory Visit (INDEPENDENT_AMBULATORY_CARE_PROVIDER_SITE_OTHER): Payer: PPO | Admitting: Internal Medicine

## 2018-05-22 ENCOUNTER — Encounter: Payer: Self-pay | Admitting: Internal Medicine

## 2018-05-22 ENCOUNTER — Ambulatory Visit: Payer: PPO

## 2018-05-22 VITALS — BP 142/86 | HR 65 | Ht 71.0 in | Wt 306.0 lb

## 2018-05-22 DIAGNOSIS — R0609 Other forms of dyspnea: Secondary | ICD-10-CM | POA: Diagnosis not present

## 2018-05-22 DIAGNOSIS — I10 Essential (primary) hypertension: Secondary | ICD-10-CM

## 2018-05-22 DIAGNOSIS — I2782 Chronic pulmonary embolism: Secondary | ICD-10-CM | POA: Diagnosis not present

## 2018-05-22 DIAGNOSIS — J4530 Mild persistent asthma, uncomplicated: Secondary | ICD-10-CM | POA: Diagnosis not present

## 2018-05-22 LAB — PULMONARY FUNCTION TEST
DL/VA % pred: 117 %
DL/VA: 5.47 ml/min/mmHg/L
DLCO UNC % PRED: 87 %
DLCO cor % pred: 94 %
DLCO cor: 31.84 ml/min/mmHg
DLCO unc: 29.56 ml/min/mmHg
FEF 25-75 POST: 1.92 L/s
FEF 25-75 Pre: 1.35 L/sec
FEF2575-%Change-Post: 42 %
FEF2575-%Pred-Post: 72 %
FEF2575-%Pred-Pre: 50 %
FEV1-%Change-Post: 8 %
FEV1-%Pred-Post: 82 %
FEV1-%Pred-Pre: 75 %
FEV1-Post: 2.52 L
FEV1-Pre: 2.31 L
FEV1FVC-%Change-Post: 0 %
FEV1FVC-%PRED-PRE: 90 %
FEV6-%Change-Post: 9 %
FEV6-%Pred-Post: 92 %
FEV6-%Pred-Pre: 84 %
FEV6-Post: 3.58 L
FEV6-Pre: 3.26 L
FEV6FVC-%Change-Post: 0 %
FEV6FVC-%Pred-Post: 102 %
FEV6FVC-%Pred-Pre: 102 %
FVC-%Change-Post: 9 %
FVC-%PRED-POST: 90 %
FVC-%Pred-Pre: 82 %
FVC-Post: 3.64 L
FVC-Pre: 3.32 L
Post FEV1/FVC ratio: 69 %
Post FEV6/FVC ratio: 98 %
Pre FEV1/FVC ratio: 70 %
Pre FEV6/FVC Ratio: 98 %
RV % pred: 126 %
RV: 3.11 L
TLC % pred: 89 %
TLC: 6.5 L

## 2018-05-22 MED ORDER — ALBUTEROL SULFATE HFA 108 (90 BASE) MCG/ACT IN AERS: INHALATION_SPRAY | RESPIRATORY_TRACT | 2 refills | Status: DC

## 2018-05-22 MED ORDER — BUDESONIDE-FORMOTEROL FUMARATE 80-4.5 MCG/ACT IN AERO: 2.0000 | INHALATION_SPRAY | Freq: Two times a day (BID) | RESPIRATORY_TRACT | 0 refills | Status: DC

## 2018-05-22 MED ORDER — BUDESONIDE-FORMOTEROL FUMARATE 80-4.5 MCG/ACT IN AERO: INHALATION_SPRAY | RESPIRATORY_TRACT | 11 refills | Status: DC

## 2018-05-22 NOTE — Progress Notes (Signed)
[name] [name], male    DOB: January 18, 1951    MRN: [id number]    Brief patient profile:  48 yobm never smoker  With asthma as child never able to play to hs sports maint on advair as adult and obesity complicated by dm/ hyperlipidemia/hbp plus  problem with with L >R leg swelling x decades then pain in L calf 06/2016 then 4 days later sweating / cp/sob when went to resume Y >   admit [facility] dx L dvt / PE and doe ever since s improvement so referred to pulmonary clinic 04/24/2018 by Dr   Telford Nab   History of Present Illness  04/24/2018  Pulmonary/ 1st office eval/Meia Emley  Chief Complaint  Patient presents with  . Pulmonary Consult    Referred by Dr. Clide Deutscher.  Pt c/o SOB since dxed with PE in March 2018.  He gets winded walking approx 40 yards.  Dyspnea:  70 ft from kitchen door uphill to mb now has to go slower than used to no change in wt since onset   Cough: no Sleep: on L side / flat bed/ sleeps on 30 degree wedge  SABA use: no inhalers rec Stop normodyne  (labetolol) and start bisoprolol 5 mg twice daily instead  No other changes in medications  schedule venous dopplers and echo  > both wnl x for Grade 1 diastolic dysfunction    1/0/9323  f/u ov/Windel Keziah re: doe / MO Chief Complaint  Patient presents with  . Results    PFT - Dyspnea on exertion  Dyspnea:  Improved off normodyne Cough: none  Sleeping: fine wit 30 degree wedge  SABA use: none  02: none    No obvious day to day or daytime variability or assoc excess/ purulent sputum or mucus plugs or hemoptysis or cp or chest tightness, subjective wheeze or overt sinus or hb symptoms.   Sleeping as above  without nocturnal  or early am exacerbation  of respiratory  c/o's or need for noct saba. Also denies any obvious fluctuation of symptoms with weather or environmental changes or other aggravating or alleviating factors except as outlined above   No unusual exposure hx or h/o childhood pna  or knowledge of premature  birth.  Current Allergies, Complete Past Medical History, Past Surgical History, Family History, and Social History were reviewed in [employer] record.  ROS  The following are not active complaints unless bolded Hoarseness, sore throat, dysphagia, dental problems, itching, sneezing,  nasal congestion or discharge of excess mucus or purulent secretions, ear ache,   fever, chills, sweats, unintended wt loss or wt gain, classically pleuritic or exertional cp,  orthopnea pnd or arm/hand swelling  or leg swelling, presyncope, palpitations, abdominal pain, anorexia, nausea, vomiting, diarrhea  or change in bowel habits or change in bladder habits, change in stools or change in urine, dysuria, hematuria,  rash, arthralgias, visual complaints, headache, numbness, weakness or ataxia or problems with walking or coordination,  change in mood or  memory.        Current Meds  Medication Sig  . allopurinol (ZYLOPRIM) 300 MG tablet Take 300 mg by mouth daily.  Marland Kitchen apixaban (ELIQUIS) 2.5 MG TABS tablet Take 2.5 mg by mouth 2 (two) times daily.  Marland Kitchen atorvastatin (LIPITOR) 20 MG tablet Take 20 mg by mouth daily.  . bisoprolol (ZEBETA) 5 MG tablet One twice daily  . chlorthalidone (HYGROTON) 25 MG tablet Take 25 mg by mouth daily.  . Fluticasone-Salmeterol (ADVAIR) 250-50 MCG/DOSE AEPB  Inhale 1 puff into the lungs 2 (two) times daily.  Marland Kitchen glimepiride (AMARYL) 4 MG tablet Take 4 mg by mouth daily with breakfast.  . hydrALAZINE (APRESOLINE) 50 MG tablet Take 50 mg by mouth. Patient states he takes 1 tablet 2 times per day  . insulin aspart (NOVOLOG) 100 UNIT/ML injection Inject into the skin 3 (three) times daily with meals. Patient states takes 5 units before meals  . insulin detemir (LEVEMIR) 100 UNIT/ML injection Inject into the skin at bedtime. Patient states he takes 10 units at bedtime.  . isosorbide mononitrate (IMDUR) 30 MG 24 hr tablet Take 30 mg by mouth daily.  . Omega-3 Fatty Acids  (FISH OIL) 1200 MG CAPS Take 1,200 mg by mouth. Patient states 1 tablet 1 time per day  . potassium chloride SA (K-DUR,KLOR-CON) 20 MEQ tablet Take 20 mEq by mouth daily.          .         Objective:     Obese pleasant amb bm nad   Wt Readings from Last 3 Encounters:  05/22/18 (!) 306 lb (138.8 kg)  04/24/18 (!) 302 lb 6.4 oz (137.2 kg)     Vital signs reviewed - Note on arrival 02 sats  97% on RA       amb obese bm/ minimal pseudowheeze   HEENT: Partial plates, nl  turbinates bilaterally, and oropharynx. Nl external ear canals without cough reflex   NECK :  without JVD/Nodes/TM/ nl carotid upstrokes bilaterally   LUNGS: no acc muscle use,  Nl contour chest which is clear to A and P bilaterally without cough on insp or exp maneuvers   CV:  RRR  no s3 or murmur or increase in P2, and  Pos pitting edema L > R legs  ABD:  soft and nontender with nl inspiratory excursion in the supine position. No bruits or organomegaly appreciated, bowel sounds nl  MS:  Nl gait/ ext warm without deformities, calf tenderness, cyanosis or clubbing No obvious joint restrictions   SKIN: warm and dry with elephantiasis change both legs  NEURO:  alert, approp, nl sensorium with  no motor or cerebellar deficits apparent.          Assessment

## 2018-05-22 NOTE — Patient Instructions (Signed)
Plan A = Automatic = symbicort 160 Take 2 puffs first thing in am and then another 2 puffs about 12 hours later.    Work on inhaler technique:  relax and gently blow all the way out then take a nice smooth deep breath back in, triggering the inhaler at same time you start breathing in.  Hold for up to 5 seconds if you can. Blow out thru nose. Rinse and gargle with water when done    Plan B = Backup Only use your albuterol inhaler as a rescue medication to be used if you can't catch your breath by resting or doing a relaxed purse lip breathing pattern.  - The less you use it, the better it will work when you need it. - Ok to use the inhaler up to 2 puffs  every 4 hours if you must but call for appointment if use goes up over your usual need - Don't leave home without it !!  (think of it like the spare tire for your car)    Please schedule a follow up visit in 3 months but call sooner if needed  with all medications /inhalers/ solutions in hand so we can verify exactly what you are taking. This includes all medications from all doctors and over the counters

## 2018-05-23 ENCOUNTER — Encounter: Payer: Self-pay | Admitting: Internal Medicine

## 2018-05-23 DIAGNOSIS — J4530 Mild persistent asthma, uncomplicated: Secondary | ICD-10-CM | POA: Insufficient documentation

## 2018-05-23 NOTE — Assessment & Plan Note (Addendum)
Onset in childhood  - improved symptoms off normodyne since 04/24/18  - PFT's  05/22/2018  FEV1 2.52 (82 % ) ratio 0.69  p 8 % improvement from saba p advair 250  prior to study with DLCO  87/94c % corrects to 117 % for alv volume with mild curvature - 05/22/2018  After extensive coaching inhaler device,  effectiveness =    75% so try symbicort 160 2bid    F/u  In 3 m to consider step down per guidelines if maintains good control on the higher dose    I had an extended discussion with the patient reviewing all relevant studies completed to date and  lasting 15 to 20 minutes of a 25 minute visit    See device teaching which extended face to face time for this visit.  Each maintenance medication was reviewed in detail including emphasizing most importantly the difference between maintenance and prns and under what circumstances the prns are to be triggered using an action plan format that is not reflected in the computer generated alphabetically organized AVS which I have not found useful in most complex patients, especially with respiratory illnesses  Please see AVS for specific instructions unique to this visit that I personally wrote and verbalized to the the pt in detail and then reviewed with pt  by my nurse highlighting any  changes in therapy recommended at today's visit to their plan of care.

## 2018-05-23 NOTE — Assessment & Plan Note (Signed)
Onset 06/2016 p PE 04/24/2018   Walked RA  2 laps @ 244ft each @ avg  pace  stopped due to  End of study,  Min sob, no desats  - Spirometry 04/24/2018  FEV1 2.2 (70%)  Ratio 72 mild curvature p am advair 250 while on normodyne  - Echo 05/10/2018 - Left ventricle: The cavity size was normal. There was mild   concentric hypertrophy. Systolic function was normal. The   estimated ejection fraction was in the range of 55% to 60%. Wall   motion was normal; there were no regional wall motion   abnormalities. Doppler parameters are consistent with abnormal   left ventricular relaxation (grade 1 diastolic dysfunction).   Doppler parameters are consistent with indeterminate ventricular   filling pressure. - Aortic valve: Mildly calcified annulus. Trileaflet. - Aorta: Aortic root dimension: 38 mm (ED). - Mitral valve: Mildly calcified annulus. Mildly thickened leaflets   . There was mild regurgitation. - PFT's 05/22/2018 wnl p advair x for ERV - 29%  Remaining doe likely therefore related to MO/ diastolic dysfunction

## 2018-05-23 NOTE — Assessment & Plan Note (Signed)
Dx 06/2016  [facility] - Echo 05/10/18 nl PA pressures - Venous dopplers 05/10/18  wnl   Given his MO and chronic leg symptoms it would be very difficult to pick up acute / recurrent dvt so better to use prophylactic dose indef - fine to refer to hematology if wants second opinion on this   Discussed in detail all the  indications, usual  risks and alternatives  relative to the benefits with patient who agrees to proceed with longterm low dose doac for now    - xarelto 10 mg may be cheaper alternative he could consider if cose an issue.

## 2018-05-23 NOTE — Assessment & Plan Note (Addendum)
Changed normodyne to bisoprolol 04/24/2018 due to prob asthma  Adequate control on present rx, reviewed in detail with pt > no change in rx needed    Although even in retrospect it may not be clear the normodyne  contributed to the pt's symptoms,  Pt improved off it and adding  back at this point or in the future would risk confusion in interpretation of non-specific respiratory symptoms to which this patient is prone  ie  Better not to muddy the waters here.

## 2018-05-31 DIAGNOSIS — E1121 Type 2 diabetes mellitus with diabetic nephropathy: Secondary | ICD-10-CM | POA: Diagnosis not present

## 2018-05-31 DIAGNOSIS — I10 Essential (primary) hypertension: Secondary | ICD-10-CM | POA: Diagnosis not present

## 2018-05-31 DIAGNOSIS — N183 Chronic kidney disease, stage 3 (moderate): Secondary | ICD-10-CM | POA: Diagnosis not present

## 2018-07-03 DIAGNOSIS — E1121 Type 2 diabetes mellitus with diabetic nephropathy: Secondary | ICD-10-CM | POA: Diagnosis not present

## 2018-07-03 DIAGNOSIS — I10 Essential (primary) hypertension: Secondary | ICD-10-CM | POA: Diagnosis not present

## 2018-07-03 DIAGNOSIS — N183 Chronic kidney disease, stage 3 (moderate): Secondary | ICD-10-CM | POA: Diagnosis not present

## 2018-07-03 DIAGNOSIS — Z6841 Body Mass Index (BMI) 40.0 and over, adult: Secondary | ICD-10-CM | POA: Diagnosis not present

## 2018-08-11 DIAGNOSIS — M25562 Pain in left knee: Secondary | ICD-10-CM | POA: Diagnosis not present

## 2018-08-21 ENCOUNTER — Ambulatory Visit: Payer: PPO | Admitting: Internal Medicine

## 2018-09-04 ENCOUNTER — Encounter: Payer: Self-pay | Admitting: Internal Medicine

## 2018-09-04 ENCOUNTER — Ambulatory Visit (INDEPENDENT_AMBULATORY_CARE_PROVIDER_SITE_OTHER): Payer: PPO | Admitting: Internal Medicine

## 2018-09-04 ENCOUNTER — Other Ambulatory Visit: Payer: Self-pay

## 2018-09-04 VITALS — BP 152/92 | HR 63 | Temp 99.2°F | Ht 72.0 in | Wt 323.0 lb

## 2018-09-04 DIAGNOSIS — J4530 Mild persistent asthma, uncomplicated: Secondary | ICD-10-CM

## 2018-09-04 DIAGNOSIS — R0609 Other forms of dyspnea: Secondary | ICD-10-CM

## 2018-09-04 NOTE — Assessment & Plan Note (Signed)
Body mass index is 43.81 kg/m.  -  trending up  Lab Results  Component Value Date   TSH 1.96 04/24/2018     Contributing to gerd risk/ doe/reviewed the need and the process to achieve and maintain neg calorie balance > defer f/u primary care including intermittently monitoring thyroid status     I had an extended discussion with the patient reviewing all relevant studies completed to date and  lasting 15 to 20 minutes of a 25 minute visit    Each maintenance medication was reviewed in detail including most importantly the difference between maintenance and prns and under what circumstances the prns are to be triggered using an action plan format that is not reflected in the computer generated alphabetically organized AVS.     Please see AVS for specific instructions unique to this visit that I personally wrote and verbalized to the the pt in detail and then reviewed with pt  by my nurse highlighting any  changes in therapy recommended at today's visit to their plan of care.

## 2018-09-04 NOTE — Assessment & Plan Note (Addendum)
Onset in childhood  - improved symptoms off normodyne since 04/24/18 - PFT's  05/22/2018  FEV1 2.52 (82 % ) ratio 0.69  p 8 % improvement from saba p advair 250  prior to study with DLCO  87/94c % corrects to 117 % for alv volume with mild curvature  - 05/22/2018    try symbicort  1602bid  > preferred advair but rec this be tapered off 09/04/2018  09/04/2018  After extensive coaching inhaler device,  effectiveness =    50% at best   It may well be he has asthma and reason advair worked better than symbiocrt was he got more ICS into airway but not really sure he needs it so rec try taper off advair and restart if flares    See device teaching which extended face to face time for this visit   Pulmonary f/u is prn

## 2018-09-04 NOTE — Progress Notes (Signed)
[name] [name], male    DOB: [birth year]    MRN: [id number]    Brief patient profile:  28 yobm never smoker  With asthma as child never able to play to hs sports maint on advair as adult and obesity complicated by dm/ hyperlipidemia/hbp plus  problem with with L >R leg swelling x decades then pain in L calf 06/2016 then 4 days later sweating / cp/sob when went to resume Y >   admit [facility] dx L dvt / PE and doe ever since s improvement so referred to pulmonary clinic 04/24/2018 by Dr   Telford Nab   History of Present Illness  04/24/2018  Pulmonary/ 1st office eval/[name] [name]  Chief Complaint  Patient presents with  . Pulmonary Consult    Referred by Dr. Clide Deutscher.  Pt c/o SOB since dxed with PE in March 2018.  He gets winded walking approx 40 yards.  Dyspnea:  70 ft from kitchen door uphill to mb now has to go slower than used to no change in wt since onset   Cough: no Sleep: on L side / flat bed/ sleeps on 30 degree wedge  SABA use: no inhalers rec Stop normodyne  (labetolol) and start bisoprolol 5 mg twice daily instead  No other changes in medications  schedule venous dopplers and echo  > both wnl x for Grade 1 diastolic dysfunction    12/27/3714  f/u ov/[name] [name] re: doe / MO Chief Complaint  Patient presents with  . Results    PFT - Dyspnea on exertion  Dyspnea:  Improved off normodyne Cough: none  Sleeping: fine with 30 degree wedge  SABA use: none  02: none  rec Plan A = Automatic = symbicort 160 Take 2 puffs first thing in am and then another 2 puffs about 12 hours later.  Work on inhaler technique:   Plan B = Backup Only use your albuterol inhaler as a rescue medication     09/04/2018  f/u ov/[name] [name] re: doe/ MO  Chief Complaint  Patient presents with  . Follow-up    Pt states he stopped taking the symbicort becuase it was causing his BP to increase to 170/90. Pt states he is now taking the Advair again and his pressure is now 140-150/80. Pt states his breathing feels better  since taking the Advair. Pt states he hasnt had to use the rescue inhaler yet. Pt denies CP/tightness, cough, f/c/s.   Dyspnea:  Walks to mailbox 75 ft slt incline to mailbox fine both ways Cough: none  Sleeping: bed if flat with wedge pillow 30 degrees/ no symptom SABA use: none 02: none   No obvious day to day or daytime variability or assoc excess/ purulent sputum or mucus plugs or hemoptysis or cp or chest tightness, subjective wheeze or overt sinus or hb symptoms.   Sleeping  without nocturnal  or early am exacerbation  of respiratory  c/o's or need for noct saba. Also denies any obvious fluctuation of symptoms with weather or environmental changes or other aggravating or alleviating factors except as outlined above   No unusual exposure hx or h/o childhood pna/ asthma or knowledge of premature birth.  Current Allergies, Complete Past Medical History, Past Surgical History, Family History, and Social History were reviewed in [employer] record.  ROS  The following are not active complaints unless bolded Hoarseness, sore throat, dysphagia, dental problems, itching, sneezing,  nasal congestion or discharge of excess mucus or purulent secretions, ear ache,   fever,  chills, sweats, unintended wt loss or wt gain, classically pleuritic or exertional cp,  orthopnea pnd or arm/hand swelling  or leg swelling L >R  , presyncope, palpitations, abdominal pain, anorexia, nausea, vomiting, diarrhea  or change in bowel habits or change in bladder habits, change in stools or change in urine, dysuria, hematuria,  rash, arthralgias, visual complaints, headache, numbness, weakness or ataxia or problems with walking or coordination,  change in mood or  memory.        Current Meds  Medication Sig  . albuterol (PROAIR HFA) 108 (90 Base) MCG/ACT inhaler 2 puffs every 4 hours as needed only  if your can't catch your breath  . allopurinol (ZYLOPRIM) 300 MG tablet Take 300 mg by mouth daily.   Marland Kitchen apixaban (ELIQUIS) 2.5 MG TABS tablet Take 2.5 mg by mouth 2 (two) times daily.  Marland Kitchen atorvastatin (LIPITOR) 20 MG tablet Take 20 mg by mouth daily.  . bisoprolol (ZEBETA) 5 MG tablet One twice daily  . empagliflozin (JARDIANCE) 25 MG TABS tablet Take 25 mg by mouth daily.  . Fluticasone-Salmeterol (ADVAIR) 250-50 MCG/DOSE AEPB Inhale 1 puff into the lungs 2 (two) times daily.  Marland Kitchen glimepiride (AMARYL) 4 MG tablet Take 4 mg by mouth daily with breakfast.  . hydrALAZINE (APRESOLINE) 50 MG tablet Take 50 mg by mouth. Patient states he takes 1 tablet 2 times per day  . insulin aspart (NOVOLOG) 100 UNIT/ML injection Inject into the skin 3 (three) times daily with meals. Patient states takes 5 units before meals  . insulin detemir (LEVEMIR) 100 UNIT/ML injection Inject into the skin at bedtime. Patient states he takes 10 units at bedtime.  . isosorbide mononitrate (IMDUR) 30 MG 24 hr tablet Take 30 mg by mouth daily.  Marland Kitchen losartan (COZAAR) 100 MG tablet Take 100 mg by mouth daily.  . Omega-3 Fatty Acids (FISH OIL) 1200 MG CAPS Take 1,200 mg by mouth. Patient states 1 tablet 1 time per day  . potassium chloride SA (K-DUR,KLOR-CON) 20 MEQ tablet Take 20 mEq by mouth daily.          Objective:      very pleasant amb bm nad / pseudowheeze eliminated with PLM  Vital signs reviewed - Note on arrival 02 sats  97% on RA    09/04/2018            323   05/22/18 (!) 306 lb (138.8 kg)  04/24/18 (!) 302 lb 6.4 oz (137.2 kg)         HEENT: partial dentures/ nl turbinates bilaterally, and oropharynx. Nl external ear canals without cough reflex   NECK :  without JVD/Nodes/TM/ nl carotid upstrokes bilaterally   LUNGS: no acc muscle use,  Nl contour chest which is clear to A and P bilaterally without cough on insp or exp maneuvers   CV:  RRR  no s3 or murmur or increase in P2, and elephantiasis both LE with trace pitting edema L>R   ABD:  soft and nontender with nl inspiratory excursion in the supine  position. No bruits or organomegaly appreciated, bowel sounds nl  MS:  Nl gait/ ext warm without deformities, calf tenderness, cyanosis or clubbing No obvious joint restrictions   SKIN: warm and dry without lesions    NEURO:  alert, approp, nl sensorium with  no motor or cerebellar deficits apparent.            Assessment

## 2018-09-04 NOTE — Assessment & Plan Note (Addendum)
Onset 06/2016 p PE 04/24/2018   Walked RA  2 laps @ 256ft each @ avg  pace  stopped due to  End of study,  Min sob, no desats  - Spirometry 04/24/2018  FEV1 2.2 (70%)  Ratio 72 mild curvature p am advair 250 while on normodyne  - Echo 05/10/2018 - Left ventricle: The cavity size was normal. There was mild   concentric hypertrophy. Systolic function was normal. The   estimated ejection fraction was in the range of 55% to 60%. Wall   motion was normal; there were no regional wall motion   abnormalities. Doppler parameters are consistent with abnormal   left ventricular relaxation (grade 1 diastolic dysfunction).   Doppler parameters are consistent with indeterminate ventricular   filling pressure. - Aortic valve: Mildly calcified annulus. Trileaflet. - Aorta: Aortic root dimension: 38 mm (ED). - Mitral valve: Mildly calcified annulus. Mildly thickened leaflets   . There was mild regurgitation. - PFT's 05/22/2018 wnl p advair x for ERV - 29%    At this point better despite further wt gain  and not clear why ? From d/c normodyne   Next step since no evidence airflow obst try wean off advair (see separate a/p)

## 2018-09-04 NOTE — Patient Instructions (Addendum)
Try just one advair in the am for a few weeks and if doing the same in terms of walking then try off   Only use your albuterol as a rescue medication to be used if you can't catch your breath by resting or doing a relaxed purse lip breathing pattern.  - The less you use it, the better it will work when you need it. - Ok to use up to 2 puffs  every 4 hours if you must but call for immediate appointment if use goes up over your usual need - Don't leave home without it !!  (think of it like the spare tire for your car)     If you are satisfied with your treatment plan,  let your doctor know and he/she can either refill your medications or you can return here when your prescription runs out.     If in any way you are not 100% satisfied,  please tell us.  If 100% better, tell your friends!  Pulmonary follow up is as needed

## 2018-10-03 DIAGNOSIS — Z6841 Body Mass Index (BMI) 40.0 and over, adult: Secondary | ICD-10-CM | POA: Diagnosis not present

## 2018-10-03 DIAGNOSIS — I10 Essential (primary) hypertension: Secondary | ICD-10-CM | POA: Diagnosis not present

## 2018-10-03 DIAGNOSIS — M109 Gout, unspecified: Secondary | ICD-10-CM | POA: Diagnosis not present

## 2018-10-03 DIAGNOSIS — N183 Chronic kidney disease, stage 3 (moderate): Secondary | ICD-10-CM | POA: Diagnosis not present

## 2018-10-03 DIAGNOSIS — E1121 Type 2 diabetes mellitus with diabetic nephropathy: Secondary | ICD-10-CM | POA: Diagnosis not present

## 2018-10-25 ENCOUNTER — Ambulatory Visit: Payer: PPO | Admitting: Internal Medicine

## 2018-10-25 DIAGNOSIS — N183 Chronic kidney disease, stage 3 (moderate): Secondary | ICD-10-CM | POA: Diagnosis not present

## 2018-10-25 DIAGNOSIS — I10 Essential (primary) hypertension: Secondary | ICD-10-CM | POA: Diagnosis not present

## 2018-10-25 DIAGNOSIS — E1121 Type 2 diabetes mellitus with diabetic nephropathy: Secondary | ICD-10-CM | POA: Diagnosis not present

## 2018-11-16 ENCOUNTER — Other Ambulatory Visit: Payer: Self-pay

## 2019-01-02 DIAGNOSIS — I10 Essential (primary) hypertension: Secondary | ICD-10-CM | POA: Diagnosis not present

## 2019-01-02 DIAGNOSIS — Z6841 Body Mass Index (BMI) 40.0 and over, adult: Secondary | ICD-10-CM | POA: Diagnosis not present

## 2019-01-02 DIAGNOSIS — E1121 Type 2 diabetes mellitus with diabetic nephropathy: Secondary | ICD-10-CM | POA: Diagnosis not present

## 2019-01-02 DIAGNOSIS — M109 Gout, unspecified: Secondary | ICD-10-CM | POA: Diagnosis not present

## 2019-01-02 DIAGNOSIS — N183 Chronic kidney disease, stage 3 (moderate): Secondary | ICD-10-CM | POA: Diagnosis not present

## 2019-03-29 DIAGNOSIS — E1121 Type 2 diabetes mellitus with diabetic nephropathy: Secondary | ICD-10-CM | POA: Diagnosis not present

## 2019-03-29 DIAGNOSIS — I10 Essential (primary) hypertension: Secondary | ICD-10-CM | POA: Diagnosis not present

## 2019-04-03 DIAGNOSIS — M109 Gout, unspecified: Secondary | ICD-10-CM | POA: Diagnosis not present

## 2019-04-03 DIAGNOSIS — E782 Mixed hyperlipidemia: Secondary | ICD-10-CM | POA: Diagnosis not present

## 2019-04-03 DIAGNOSIS — N1831 Chronic kidney disease, stage 3a: Secondary | ICD-10-CM | POA: Diagnosis not present

## 2019-04-03 DIAGNOSIS — Z125 Encounter for screening for malignant neoplasm of prostate: Secondary | ICD-10-CM | POA: Diagnosis not present

## 2019-04-03 DIAGNOSIS — R17 Unspecified jaundice: Secondary | ICD-10-CM | POA: Diagnosis not present

## 2019-04-03 DIAGNOSIS — I10 Essential (primary) hypertension: Secondary | ICD-10-CM | POA: Diagnosis not present

## 2019-04-03 DIAGNOSIS — E1121 Type 2 diabetes mellitus with diabetic nephropathy: Secondary | ICD-10-CM | POA: Diagnosis not present

## 2019-04-03 DIAGNOSIS — Z6841 Body Mass Index (BMI) 40.0 and over, adult: Secondary | ICD-10-CM | POA: Diagnosis not present

## 2019-04-06 DIAGNOSIS — N184 Chronic kidney disease, stage 4 (severe): Secondary | ICD-10-CM | POA: Diagnosis not present

## 2019-07-02 DIAGNOSIS — Z6841 Body Mass Index (BMI) 40.0 and over, adult: Secondary | ICD-10-CM | POA: Diagnosis not present

## 2019-07-02 DIAGNOSIS — M109 Gout, unspecified: Secondary | ICD-10-CM | POA: Diagnosis not present

## 2019-07-02 DIAGNOSIS — Z0000 Encounter for general adult medical examination without abnormal findings: Secondary | ICD-10-CM | POA: Diagnosis not present

## 2019-07-02 DIAGNOSIS — Z1331 Encounter for screening for depression: Secondary | ICD-10-CM | POA: Diagnosis not present

## 2019-07-02 DIAGNOSIS — I129 Hypertensive chronic kidney disease with stage 1 through stage 4 chronic kidney disease, or unspecified chronic kidney disease: Secondary | ICD-10-CM | POA: Diagnosis not present

## 2019-07-02 DIAGNOSIS — N183 Chronic kidney disease, stage 3 unspecified: Secondary | ICD-10-CM | POA: Diagnosis not present

## 2019-07-02 DIAGNOSIS — E1121 Type 2 diabetes mellitus with diabetic nephropathy: Secondary | ICD-10-CM | POA: Diagnosis not present

## 2019-07-18 DIAGNOSIS — I10 Essential (primary) hypertension: Secondary | ICD-10-CM | POA: Diagnosis not present

## 2019-07-18 DIAGNOSIS — E1121 Type 2 diabetes mellitus with diabetic nephropathy: Secondary | ICD-10-CM | POA: Diagnosis not present

## 2019-08-07 ENCOUNTER — Other Ambulatory Visit: Payer: Self-pay | Admitting: Internal Medicine

## 2019-08-22 DIAGNOSIS — I890 Lymphedema, not elsewhere classified: Secondary | ICD-10-CM | POA: Diagnosis not present

## 2019-08-22 DIAGNOSIS — R600 Localized edema: Secondary | ICD-10-CM | POA: Diagnosis not present

## 2019-08-22 DIAGNOSIS — I10 Essential (primary) hypertension: Secondary | ICD-10-CM | POA: Diagnosis not present

## 2019-08-22 DIAGNOSIS — E1121 Type 2 diabetes mellitus with diabetic nephropathy: Secondary | ICD-10-CM | POA: Diagnosis not present

## 2019-08-30 ENCOUNTER — Other Ambulatory Visit (HOSPITAL_COMMUNITY): Payer: Self-pay | Admitting: Family Medicine

## 2019-08-30 DIAGNOSIS — R609 Edema, unspecified: Secondary | ICD-10-CM

## 2019-08-31 ENCOUNTER — Ambulatory Visit (HOSPITAL_COMMUNITY)
Admission: RE | Admit: 2019-08-31 | Discharge: 2019-08-31 | Disposition: A | Discharge status: Home / Self Care | Payer: PPO | Source: Ambulatory Visit | Attending: Vascular Surgery | Admitting: Vascular Surgery

## 2019-08-31 ENCOUNTER — Other Ambulatory Visit: Payer: Self-pay

## 2019-08-31 DIAGNOSIS — R609 Edema, unspecified: Secondary | ICD-10-CM

## 2019-10-02 DIAGNOSIS — Z0000 Encounter for general adult medical examination without abnormal findings: Secondary | ICD-10-CM | POA: Diagnosis not present

## 2019-10-02 DIAGNOSIS — M109 Gout, unspecified: Secondary | ICD-10-CM | POA: Diagnosis not present

## 2019-10-02 DIAGNOSIS — Z6841 Body Mass Index (BMI) 40.0 and over, adult: Secondary | ICD-10-CM | POA: Diagnosis not present

## 2019-10-02 DIAGNOSIS — E1121 Type 2 diabetes mellitus with diabetic nephropathy: Secondary | ICD-10-CM | POA: Diagnosis not present

## 2019-10-02 DIAGNOSIS — N183 Chronic kidney disease, stage 3 unspecified: Secondary | ICD-10-CM | POA: Diagnosis not present

## 2019-10-02 DIAGNOSIS — I10 Essential (primary) hypertension: Secondary | ICD-10-CM | POA: Diagnosis not present

## 2019-10-15 DIAGNOSIS — N183 Chronic kidney disease, stage 3 unspecified: Secondary | ICD-10-CM | POA: Diagnosis not present

## 2019-10-15 DIAGNOSIS — E1121 Type 2 diabetes mellitus with diabetic nephropathy: Secondary | ICD-10-CM | POA: Diagnosis not present

## 2019-10-15 DIAGNOSIS — I10 Essential (primary) hypertension: Secondary | ICD-10-CM | POA: Diagnosis not present

## 2019-10-18 DIAGNOSIS — M810 Age-related osteoporosis without current pathological fracture: Secondary | ICD-10-CM | POA: Diagnosis not present

## 2019-10-18 DIAGNOSIS — Z0389 Encounter for observation for other suspected diseases and conditions ruled out: Secondary | ICD-10-CM | POA: Diagnosis not present

## 2019-10-23 DIAGNOSIS — Z1211 Encounter for screening for malignant neoplasm of colon: Secondary | ICD-10-CM | POA: Diagnosis not present

## 2019-10-23 DIAGNOSIS — Z1212 Encounter for screening for malignant neoplasm of rectum: Secondary | ICD-10-CM | POA: Diagnosis not present

## 2019-10-29 ENCOUNTER — Other Ambulatory Visit: Payer: Self-pay | Admitting: *Deleted

## 2019-10-29 DIAGNOSIS — M25561 Pain in right knee: Secondary | ICD-10-CM

## 2019-11-05 ENCOUNTER — Ambulatory Visit (INDEPENDENT_AMBULATORY_CARE_PROVIDER_SITE_OTHER): Payer: PPO | Admitting: Physician Assistant

## 2019-11-05 ENCOUNTER — Ambulatory Visit (HOSPITAL_COMMUNITY)
Admission: RE | Admit: 2019-11-05 | Discharge: 2019-11-05 | Disposition: A | Discharge status: Home / Self Care | Payer: PPO | Source: Ambulatory Visit | Attending: Surgery | Admitting: Surgery

## 2019-11-05 ENCOUNTER — Other Ambulatory Visit: Payer: Self-pay

## 2019-11-05 VITALS — BP 155/80 | HR 54 | Temp 97.3°F | Resp 20 | Ht 72.0 in | Wt 291.1 lb

## 2019-11-05 DIAGNOSIS — M25561 Pain in right knee: Secondary | ICD-10-CM | POA: Diagnosis not present

## 2019-11-05 DIAGNOSIS — M25562 Pain in left knee: Secondary | ICD-10-CM | POA: Diagnosis not present

## 2019-11-05 DIAGNOSIS — I872 Venous insufficiency (chronic) (peripheral): Secondary | ICD-10-CM | POA: Diagnosis not present

## 2019-11-05 DIAGNOSIS — M7989 Other specified soft tissue disorders: Secondary | ICD-10-CM

## 2019-11-05 NOTE — Progress Notes (Signed)
Requested by:  Elza Rafter, MD [address] [place],  [place] [address]  Reason for consultation: bilateral lower extremity swelling    History of Present Illness   [name] [name] is a 69 y.o. ([birth year]) male who presents for evaluation of bilateral lower extremity swelling. He states he has had swelling of his legs for many years. He denies any pain, aching, heaviness, tiredness, throbbing, itching or bleeding. He has had an ulceration on the back of his right leg that has been unchanged for many years. It has never bled. It has remained somewhat of a dry scaly area. He does occasionally have a deep burning sensation in the left leg. He says he does not elevate frequently but when he goes to sleep and first wakes up the swelling is improved. He has more than 10 years ago wore compression stockings but he had trouble tolerating them at that time because he felt that they dug into his skin at the ankle. 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 He was on Eliquis for 1.5 years following. He has had no recurrence. He has no family history of venous insufficiency or DVT  Venous symptoms include:burning, swelling,uler Onset/duration: >25 years Occupation:  Retired Administrator Aggravating factors: sitting, standing Alleviating factors: elevation Compression:  Yes > 10 years ago Helps:  minimally Pain medications:  No pain Previous vein procedures:  none History of DVT:  Yes, Left lower extremity in 2018   Social History   Socioeconomic History  . Marital status: Married    Spouse name: Not on file  . Number of children: Not on file  . Years of education: Not on file  . Highest education level: Not on file  Occupational History  . Not on file  Tobacco Use  . Smoking status: Never Smoker  . Smokeless tobacco: Never Used  Vaping Use  . Vaping Use: Never used  Substance and Sexual Activity  . Alcohol use: Not on  file  . Drug use: Not on file  . Sexual activity: Not on file  Other Topics Concern  . Not on file  Social History Narrative  . Not on file   Social Determinants of Health   Financial Resource Strain:   . Difficulty of Paying Living Expenses:   Food Insecurity:   . Worried About Charity fundraiser in the Last Year:   . Arboriculturist in the Last Year:   Transportation Needs:   . Film/video editor (Medical):   Marland Kitchen Lack of Transportation (Non-Medical):   Physical Activity:   . Days of Exercise per Week:   . Minutes of Exercise per Session:   Stress:   . Feeling of Stress :   Social Connections:   . Frequency of Communication with Friends and Family:   . Frequency of Social Gatherings with Friends and Family:   . Attends Religious Services:   . Active Member of Clubs or Organizations:   . Attends Archivist Meetings:   Marland Kitchen Marital Status:   Intimate Partner Violence:   . Fear of Current or Ex-Partner:   . Emotionally Abused:   Marland Kitchen Physically Abused:   . Sexually Abused:      Current Outpatient Medications  Medication Sig Dispense Refill  . albuterol (VENTOLIN HFA) 108 (90 Base) MCG/ACT inhaler INHALE 2 PUFFS BY MOUTH EVERY 4 HOURS AS NEEDED ONLY  IF  YOU  CANT  CATCH  YOUR  BREATH 8 g 0  . allopurinol (ZYLOPRIM) 300 MG tablet Take 300 mg by mouth daily.    . bisoprolol-hydrochlorothiazide (ZIAC) 10-6.25 MG tablet Take 1 tablet by mouth daily.    . chlorthalidone (HYGROTON) 25 MG tablet Take 25 mg by mouth daily.    . empagliflozin (JARDIANCE) 25 MG TABS tablet Take 25 mg by mouth daily.    . Fluticasone-Salmeterol (ADVAIR) 250-50 MCG/DOSE AEPB Inhale 1 puff into the lungs 2 (two) times daily.    Marland Kitchen glimepiride (AMARYL) 4 MG tablet Take 4 mg by mouth daily with breakfast.    . hydrALAZINE (APRESOLINE) 50 MG tablet Take 50 mg by mouth. Patient states he takes 1 tablet 2 times per day    . insulin aspart (NOVOLOG) 100 UNIT/ML injection Inject into the skin 3  (three) times daily with meals. Patient states takes 5 units before meals    . insulin detemir (LEVEMIR) 100 UNIT/ML injection Inject into the skin at bedtime. Patient states he takes 10 units at bedtime.    Marland Kitchen losartan (COZAAR) 100 MG tablet Take 100 mg by mouth daily.     No current facility-administered medications for this visit.    No Known Allergies  REVIEW OF SYSTEMS (negative unless checked):   Cardiac:  []  Chest pain or chest pressure? [x]  Shortness of breath upon activity? Has asthma []  Shortness of breath when lying flat? []  Irregular heart rhythm?  Vascular:  []  Pain in calf, thigh, or hip brought on by walking? []  Pain in feet at night that wakes you up from your sleep? []  Blood clot in your veins? [x]  Leg swelling?  Pulmonary:  []  Oxygen at home? []  Productive cough? []  Wheezing?  Neurologic:  []  Sudden weakness in arms or legs? []  Sudden numbness in arms or legs? []  Sudden onset of difficult speaking or slurred speech? []  Temporary loss of vision in one eye? []  Problems with dizziness?  Gastrointestinal:  []  Blood in stool? []  Vomited blood?  Genitourinary:  []  Burning when urinating? []  Blood in urine?  Psychiatric:  []  Major depression  Hematologic:  []  Bleeding problems? []  Problems with blood clotting?  Dermatologic:  []  Rashes or ulcers?  Constitutional:  []  Fever or chills?  Ear/Nose/Throat:  []  Change in hearing? []  Nose bleeds? []  Sore throat?  Musculoskeletal:  []  Back pain? []  Joint pain? []  Muscle pain?   Physical Examination     Vitals:   11/05/19 1146  BP: (!) 155/80  Pulse: (!) 54  Resp: 20  Temp: (!) 97.3 F (36.3 C)  TempSrc: Temporal  SpO2: 98%  Weight: 291 lb 1.6 oz (132 kg)  Height: 6' (1.829 m)   Body mass index is 39.48 kg/m.  General:  WDWN in NAD; vital signs documented above Gait: Normal HENT: WNL, normocephalic Pulmonary: normal non-labored breathing , without wheezing Cardiac: regular HR,  without  Murmurs without carotid bruit Abdomen: soft, NT, no masses Skin: proximal medial right leg at level of knee eczematous rashes Vascular Exam/Pulses:  Right Left  Radial 2+ (normal) 2+ (normal)  Femoral 2+ (normal) 2+ (normal)  Popliteal 2+ (normal) 2+ (normal)  DP 2+ (normal) 2+ (normal)  PT Unable to palpate due to edema Unable to palpate due to edema   Extremities: without varicose veins, without reticular veins, with pitting 2+ edema, with stasis pigmentation left greater than right, with lipodermatosclerosis left greater than right leg, with dry scaly type ulceration on the posterolateral right leg  Musculoskeletal: no muscle wasting or atrophy  Neurologic: A&O X 3;  No focal weakness or paresthesias are detected Psychiatric:  The pt has Normal affect.  Non-invasive Vascular Imaging   BLE Venous Insufficiency Duplex (11/05/19):   RLE:   No DVT and SVT  GSV reflux SFJ to mid thigh  GSV diameter  0.12-0.82 (at [facility])  No SSV reflux   CFV deep venous reflux   LLE:  No DVT and SVT,   GSV reflux  SFJ and proximal calf  GSV diameter 0.25-0.79 (at [facility])  No SSV reflux   CFV deep venous reflux   Medical Decision Making   [name] [name] is a 69 y.o. male who presents with: BLE chronic venous insufficiency with swelling left greater than right. Bilateral chronic venous changes. No significant pain in legs.   Based on the patient's history and examination, I recommend thigh high compression stockings, elevation and exercise therapy  Based on ultrasound study today he would not be a candidate for venous ablation as his greater saphenous veins bilaterally are very small at <0.40 mm except at the [facility]  I discussed with the patient the use of her 20-30 mm thigh high compression stockings   I will have him come back in 3 months and have a repeat venous reflux study later in the day, I feel that his study today may falsely represent his true amount of  reflux  I discussed with him that if the repeat ultrasound does not show any change could consider referral to lymphedema clinic for evaluation  Thank you for allowing [place] to participate in this patient's care.   Karoline Caldwell, PA-C [facility] Office: [phone]  11/05/2019, 12:34 PM  On call MD: Carlis Abbott

## 2019-11-07 ENCOUNTER — Other Ambulatory Visit: Payer: Self-pay | Admitting: *Deleted

## 2019-11-07 DIAGNOSIS — I872 Venous insufficiency (chronic) (peripheral): Secondary | ICD-10-CM

## 2019-11-07 DIAGNOSIS — Z01818 Encounter for other preprocedural examination: Secondary | ICD-10-CM | POA: Diagnosis not present

## 2019-11-09 DIAGNOSIS — I10 Essential (primary) hypertension: Secondary | ICD-10-CM | POA: Diagnosis not present

## 2019-11-09 DIAGNOSIS — K5730 Diverticulosis of large intestine without perforation or abscess without bleeding: Secondary | ICD-10-CM | POA: Diagnosis not present

## 2019-11-09 DIAGNOSIS — K5140 Inflammatory polyps of colon without complications: Secondary | ICD-10-CM | POA: Diagnosis not present

## 2019-11-09 DIAGNOSIS — E119 Type 2 diabetes mellitus without complications: Secondary | ICD-10-CM | POA: Diagnosis not present

## 2019-11-09 DIAGNOSIS — D122 Benign neoplasm of ascending colon: Secondary | ICD-10-CM | POA: Diagnosis not present

## 2019-11-09 DIAGNOSIS — Z794 Long term (current) use of insulin: Secondary | ICD-10-CM | POA: Diagnosis not present

## 2019-11-09 DIAGNOSIS — D123 Benign neoplasm of transverse colon: Secondary | ICD-10-CM | POA: Diagnosis not present

## 2019-11-09 DIAGNOSIS — Z1211 Encounter for screening for malignant neoplasm of colon: Secondary | ICD-10-CM | POA: Diagnosis not present

## 2019-11-09 DIAGNOSIS — Z79899 Other long term (current) drug therapy: Secondary | ICD-10-CM | POA: Diagnosis not present

## 2019-11-09 DIAGNOSIS — K635 Polyp of colon: Secondary | ICD-10-CM | POA: Diagnosis not present

## 2019-11-09 DIAGNOSIS — M109 Gout, unspecified: Secondary | ICD-10-CM | POA: Diagnosis not present

## 2019-11-09 DIAGNOSIS — Z86718 Personal history of other venous thrombosis and embolism: Secondary | ICD-10-CM | POA: Diagnosis not present

## 2019-11-09 LAB — HM COLONOSCOPY

## 2019-11-12 ENCOUNTER — Other Ambulatory Visit: Payer: Self-pay | Admitting: Pharmacist

## 2019-11-19 DIAGNOSIS — E1121 Type 2 diabetes mellitus with diabetic nephropathy: Secondary | ICD-10-CM | POA: Diagnosis not present

## 2019-11-19 DIAGNOSIS — N183 Chronic kidney disease, stage 3 unspecified: Secondary | ICD-10-CM | POA: Diagnosis not present

## 2019-11-19 DIAGNOSIS — I10 Essential (primary) hypertension: Secondary | ICD-10-CM | POA: Diagnosis not present

## 2019-11-27 DIAGNOSIS — D122 Benign neoplasm of ascending colon: Secondary | ICD-10-CM | POA: Diagnosis not present

## 2019-11-27 DIAGNOSIS — D123 Benign neoplasm of transverse colon: Secondary | ICD-10-CM | POA: Diagnosis not present

## 2019-11-27 DIAGNOSIS — D126 Benign neoplasm of colon, unspecified: Secondary | ICD-10-CM | POA: Insufficient documentation

## 2020-01-02 DIAGNOSIS — M109 Gout, unspecified: Secondary | ICD-10-CM | POA: Diagnosis not present

## 2020-01-02 DIAGNOSIS — N183 Chronic kidney disease, stage 3 unspecified: Secondary | ICD-10-CM | POA: Diagnosis not present

## 2020-01-02 DIAGNOSIS — M542 Cervicalgia: Secondary | ICD-10-CM | POA: Diagnosis not present

## 2020-01-02 DIAGNOSIS — I10 Essential (primary) hypertension: Secondary | ICD-10-CM | POA: Diagnosis not present

## 2020-01-02 DIAGNOSIS — E1121 Type 2 diabetes mellitus with diabetic nephropathy: Secondary | ICD-10-CM | POA: Diagnosis not present

## 2020-01-02 DIAGNOSIS — Z6837 Body mass index (BMI) 37.0-37.9, adult: Secondary | ICD-10-CM | POA: Diagnosis not present

## 2020-01-08 DIAGNOSIS — N1831 Chronic kidney disease, stage 3a: Secondary | ICD-10-CM | POA: Diagnosis not present

## 2020-01-08 DIAGNOSIS — I10 Essential (primary) hypertension: Secondary | ICD-10-CM | POA: Diagnosis not present

## 2020-01-08 DIAGNOSIS — E1121 Type 2 diabetes mellitus with diabetic nephropathy: Secondary | ICD-10-CM | POA: Diagnosis not present

## 2020-02-04 ENCOUNTER — Encounter (HOSPITAL_COMMUNITY): Payer: PPO

## 2020-02-04 ENCOUNTER — Ambulatory Visit: Payer: PPO

## 2020-02-26 DIAGNOSIS — E1121 Type 2 diabetes mellitus with diabetic nephropathy: Secondary | ICD-10-CM | POA: Diagnosis not present

## 2020-02-26 DIAGNOSIS — I10 Essential (primary) hypertension: Secondary | ICD-10-CM | POA: Diagnosis not present

## 2020-02-26 DIAGNOSIS — N1831 Chronic kidney disease, stage 3a: Secondary | ICD-10-CM | POA: Diagnosis not present

## 2020-04-02 DIAGNOSIS — I10 Essential (primary) hypertension: Secondary | ICD-10-CM | POA: Diagnosis not present

## 2020-04-02 DIAGNOSIS — M109 Gout, unspecified: Secondary | ICD-10-CM | POA: Diagnosis not present

## 2020-04-02 DIAGNOSIS — N183 Chronic kidney disease, stage 3 unspecified: Secondary | ICD-10-CM | POA: Diagnosis not present

## 2020-04-02 DIAGNOSIS — M542 Cervicalgia: Secondary | ICD-10-CM | POA: Diagnosis not present

## 2020-04-02 DIAGNOSIS — E1121 Type 2 diabetes mellitus with diabetic nephropathy: Secondary | ICD-10-CM | POA: Diagnosis not present

## 2020-04-02 DIAGNOSIS — Z6834 Body mass index (BMI) 34.0-34.9, adult: Secondary | ICD-10-CM | POA: Diagnosis not present

## 2020-05-05 ENCOUNTER — Other Ambulatory Visit: Payer: Self-pay | Admitting: Physician Assistant

## 2020-05-05 DIAGNOSIS — I872 Venous insufficiency (chronic) (peripheral): Secondary | ICD-10-CM

## 2020-07-03 DIAGNOSIS — M542 Cervicalgia: Secondary | ICD-10-CM | POA: Diagnosis not present

## 2020-07-03 DIAGNOSIS — M109 Gout, unspecified: Secondary | ICD-10-CM | POA: Diagnosis not present

## 2020-07-03 DIAGNOSIS — Z1331 Encounter for screening for depression: Secondary | ICD-10-CM | POA: Diagnosis not present

## 2020-07-03 DIAGNOSIS — N185 Chronic kidney disease, stage 5: Secondary | ICD-10-CM | POA: Diagnosis not present

## 2020-07-03 DIAGNOSIS — Z6833 Body mass index (BMI) 33.0-33.9, adult: Secondary | ICD-10-CM | POA: Diagnosis not present

## 2020-07-03 DIAGNOSIS — Z0000 Encounter for general adult medical examination without abnormal findings: Secondary | ICD-10-CM | POA: Diagnosis not present

## 2020-07-03 DIAGNOSIS — I10 Essential (primary) hypertension: Secondary | ICD-10-CM | POA: Diagnosis not present

## 2020-07-03 DIAGNOSIS — E1121 Type 2 diabetes mellitus with diabetic nephropathy: Secondary | ICD-10-CM | POA: Diagnosis not present

## 2020-07-06 DIAGNOSIS — J189 Pneumonia, unspecified organism: Secondary | ICD-10-CM | POA: Diagnosis not present

## 2020-07-06 DIAGNOSIS — E1122 Type 2 diabetes mellitus with diabetic chronic kidney disease: Secondary | ICD-10-CM | POA: Diagnosis not present

## 2020-07-06 DIAGNOSIS — J180 Bronchopneumonia, unspecified organism: Secondary | ICD-10-CM | POA: Diagnosis not present

## 2020-07-06 DIAGNOSIS — E785 Hyperlipidemia, unspecified: Secondary | ICD-10-CM | POA: Diagnosis not present

## 2020-07-06 DIAGNOSIS — N179 Acute kidney failure, unspecified: Secondary | ICD-10-CM | POA: Diagnosis not present

## 2020-07-06 DIAGNOSIS — Z87448 Personal history of other diseases of urinary system: Secondary | ICD-10-CM | POA: Diagnosis not present

## 2020-07-06 DIAGNOSIS — Z20822 Contact with and (suspected) exposure to covid-19: Secondary | ICD-10-CM | POA: Diagnosis not present

## 2020-07-06 DIAGNOSIS — Z792 Long term (current) use of antibiotics: Secondary | ICD-10-CM | POA: Diagnosis not present

## 2020-07-06 DIAGNOSIS — R200 Anesthesia of skin: Secondary | ICD-10-CM | POA: Diagnosis not present

## 2020-07-06 DIAGNOSIS — E162 Hypoglycemia, unspecified: Secondary | ICD-10-CM | POA: Diagnosis not present

## 2020-07-06 DIAGNOSIS — Z794 Long term (current) use of insulin: Secondary | ICD-10-CM | POA: Diagnosis not present

## 2020-07-06 DIAGNOSIS — I890 Lymphedema, not elsewhere classified: Secondary | ICD-10-CM | POA: Diagnosis not present

## 2020-07-06 DIAGNOSIS — E11649 Type 2 diabetes mellitus with hypoglycemia without coma: Secondary | ICD-10-CM | POA: Diagnosis not present

## 2020-07-06 DIAGNOSIS — I129 Hypertensive chronic kidney disease with stage 1 through stage 4 chronic kidney disease, or unspecified chronic kidney disease: Secondary | ICD-10-CM | POA: Diagnosis not present

## 2020-07-06 DIAGNOSIS — R059 Cough, unspecified: Secondary | ICD-10-CM | POA: Diagnosis not present

## 2020-07-06 DIAGNOSIS — N281 Cyst of kidney, acquired: Secondary | ICD-10-CM | POA: Diagnosis not present

## 2020-07-06 DIAGNOSIS — N184 Chronic kidney disease, stage 4 (severe): Secondary | ICD-10-CM | POA: Diagnosis not present

## 2020-07-06 DIAGNOSIS — R42 Dizziness and giddiness: Secondary | ICD-10-CM | POA: Diagnosis not present

## 2020-07-15 DIAGNOSIS — Z6835 Body mass index (BMI) 35.0-35.9, adult: Secondary | ICD-10-CM | POA: Diagnosis not present

## 2020-07-15 DIAGNOSIS — E162 Hypoglycemia, unspecified: Secondary | ICD-10-CM | POA: Diagnosis not present

## 2020-07-15 DIAGNOSIS — I10 Essential (primary) hypertension: Secondary | ICD-10-CM | POA: Diagnosis not present

## 2020-07-16 DIAGNOSIS — E1122 Type 2 diabetes mellitus with diabetic chronic kidney disease: Secondary | ICD-10-CM | POA: Diagnosis not present

## 2020-07-16 DIAGNOSIS — E211 Secondary hyperparathyroidism, not elsewhere classified: Secondary | ICD-10-CM | POA: Diagnosis not present

## 2020-07-16 DIAGNOSIS — R809 Proteinuria, unspecified: Secondary | ICD-10-CM | POA: Diagnosis not present

## 2020-07-16 DIAGNOSIS — I129 Hypertensive chronic kidney disease with stage 1 through stage 4 chronic kidney disease, or unspecified chronic kidney disease: Secondary | ICD-10-CM | POA: Diagnosis not present

## 2020-07-16 DIAGNOSIS — N170 Acute kidney failure with tubular necrosis: Secondary | ICD-10-CM | POA: Diagnosis not present

## 2020-07-16 DIAGNOSIS — D638 Anemia in other chronic diseases classified elsewhere: Secondary | ICD-10-CM | POA: Diagnosis not present

## 2020-07-16 DIAGNOSIS — E1129 Type 2 diabetes mellitus with other diabetic kidney complication: Secondary | ICD-10-CM | POA: Diagnosis not present

## 2020-07-16 DIAGNOSIS — E872 Acidosis: Secondary | ICD-10-CM | POA: Diagnosis not present

## 2020-07-16 DIAGNOSIS — I5032 Chronic diastolic (congestive) heart failure: Secondary | ICD-10-CM | POA: Diagnosis not present

## 2020-07-16 DIAGNOSIS — N189 Chronic kidney disease, unspecified: Secondary | ICD-10-CM | POA: Diagnosis not present

## 2020-07-16 DIAGNOSIS — Z79899 Other long term (current) drug therapy: Secondary | ICD-10-CM | POA: Diagnosis not present

## 2020-07-22 DIAGNOSIS — E1122 Type 2 diabetes mellitus with diabetic chronic kidney disease: Secondary | ICD-10-CM | POA: Diagnosis not present

## 2020-07-22 DIAGNOSIS — R809 Proteinuria, unspecified: Secondary | ICD-10-CM | POA: Diagnosis not present

## 2020-07-22 DIAGNOSIS — I5032 Chronic diastolic (congestive) heart failure: Secondary | ICD-10-CM | POA: Diagnosis not present

## 2020-07-22 DIAGNOSIS — E211 Secondary hyperparathyroidism, not elsewhere classified: Secondary | ICD-10-CM | POA: Diagnosis not present

## 2020-07-22 DIAGNOSIS — N170 Acute kidney failure with tubular necrosis: Secondary | ICD-10-CM | POA: Diagnosis not present

## 2020-07-22 DIAGNOSIS — N189 Chronic kidney disease, unspecified: Secondary | ICD-10-CM | POA: Diagnosis not present

## 2020-07-22 DIAGNOSIS — E872 Acidosis: Secondary | ICD-10-CM | POA: Diagnosis not present

## 2020-07-22 DIAGNOSIS — I129 Hypertensive chronic kidney disease with stage 1 through stage 4 chronic kidney disease, or unspecified chronic kidney disease: Secondary | ICD-10-CM | POA: Diagnosis not present

## 2020-07-22 DIAGNOSIS — Z79899 Other long term (current) drug therapy: Secondary | ICD-10-CM | POA: Diagnosis not present

## 2020-07-22 DIAGNOSIS — D638 Anemia in other chronic diseases classified elsewhere: Secondary | ICD-10-CM | POA: Diagnosis not present

## 2020-08-01 DIAGNOSIS — E1122 Type 2 diabetes mellitus with diabetic chronic kidney disease: Secondary | ICD-10-CM | POA: Diagnosis not present

## 2020-08-01 DIAGNOSIS — N185 Chronic kidney disease, stage 5: Secondary | ICD-10-CM | POA: Diagnosis not present

## 2020-08-01 DIAGNOSIS — E211 Secondary hyperparathyroidism, not elsewhere classified: Secondary | ICD-10-CM | POA: Diagnosis not present

## 2020-08-01 DIAGNOSIS — E559 Vitamin D deficiency, unspecified: Secondary | ICD-10-CM | POA: Diagnosis not present

## 2020-08-01 DIAGNOSIS — D472 Monoclonal gammopathy: Secondary | ICD-10-CM | POA: Diagnosis not present

## 2020-08-01 DIAGNOSIS — I129 Hypertensive chronic kidney disease with stage 1 through stage 4 chronic kidney disease, or unspecified chronic kidney disease: Secondary | ICD-10-CM | POA: Diagnosis not present

## 2020-08-01 DIAGNOSIS — R600 Localized edema: Secondary | ICD-10-CM | POA: Diagnosis not present

## 2020-08-01 DIAGNOSIS — D638 Anemia in other chronic diseases classified elsewhere: Secondary | ICD-10-CM | POA: Diagnosis not present

## 2020-08-01 DIAGNOSIS — I5032 Chronic diastolic (congestive) heart failure: Secondary | ICD-10-CM | POA: Diagnosis not present

## 2020-08-08 ENCOUNTER — Other Ambulatory Visit (HOSPITAL_COMMUNITY): Payer: Self-pay | Admitting: Nephrology

## 2020-08-08 DIAGNOSIS — E559 Vitamin D deficiency, unspecified: Secondary | ICD-10-CM

## 2020-08-08 DIAGNOSIS — I129 Hypertensive chronic kidney disease with stage 1 through stage 4 chronic kidney disease, or unspecified chronic kidney disease: Secondary | ICD-10-CM

## 2020-08-08 DIAGNOSIS — D472 Monoclonal gammopathy: Secondary | ICD-10-CM

## 2020-08-08 DIAGNOSIS — I5032 Chronic diastolic (congestive) heart failure: Secondary | ICD-10-CM

## 2020-08-08 DIAGNOSIS — N2581 Secondary hyperparathyroidism of renal origin: Secondary | ICD-10-CM

## 2020-08-08 DIAGNOSIS — E1122 Type 2 diabetes mellitus with diabetic chronic kidney disease: Secondary | ICD-10-CM

## 2020-08-08 DIAGNOSIS — N185 Chronic kidney disease, stage 5: Secondary | ICD-10-CM

## 2020-08-11 ENCOUNTER — Telehealth (HOSPITAL_COMMUNITY): Payer: Self-pay

## 2020-08-12 ENCOUNTER — Telehealth (HOSPITAL_COMMUNITY): Payer: Self-pay

## 2020-10-08 DIAGNOSIS — N185 Chronic kidney disease, stage 5: Secondary | ICD-10-CM | POA: Diagnosis not present

## 2020-10-08 DIAGNOSIS — E1121 Type 2 diabetes mellitus with diabetic nephropathy: Secondary | ICD-10-CM | POA: Diagnosis not present

## 2020-10-08 DIAGNOSIS — Z0000 Encounter for general adult medical examination without abnormal findings: Secondary | ICD-10-CM | POA: Diagnosis not present

## 2020-10-08 DIAGNOSIS — I10 Essential (primary) hypertension: Secondary | ICD-10-CM | POA: Diagnosis not present

## 2020-10-08 DIAGNOSIS — E162 Hypoglycemia, unspecified: Secondary | ICD-10-CM | POA: Diagnosis not present

## 2020-10-08 DIAGNOSIS — Z6834 Body mass index (BMI) 34.0-34.9, adult: Secondary | ICD-10-CM | POA: Diagnosis not present

## 2020-10-28 ENCOUNTER — Other Ambulatory Visit: Payer: Self-pay | Admitting: Internal Medicine

## 2020-11-07 DIAGNOSIS — I130 Hypertensive heart and chronic kidney disease with heart failure and stage 1 through stage 4 chronic kidney disease, or unspecified chronic kidney disease: Secondary | ICD-10-CM | POA: Diagnosis not present

## 2020-11-07 DIAGNOSIS — M25561 Pain in right knee: Secondary | ICD-10-CM | POA: Diagnosis not present

## 2020-11-07 DIAGNOSIS — E1122 Type 2 diabetes mellitus with diabetic chronic kidney disease: Secondary | ICD-10-CM | POA: Diagnosis not present

## 2020-11-07 DIAGNOSIS — N184 Chronic kidney disease, stage 4 (severe): Secondary | ICD-10-CM | POA: Diagnosis not present

## 2020-11-07 DIAGNOSIS — J9811 Atelectasis: Secondary | ICD-10-CM | POA: Diagnosis not present

## 2020-11-07 DIAGNOSIS — I890 Lymphedema, not elsewhere classified: Secondary | ICD-10-CM | POA: Diagnosis not present

## 2020-11-07 DIAGNOSIS — M25562 Pain in left knee: Secondary | ICD-10-CM | POA: Diagnosis not present

## 2020-11-07 DIAGNOSIS — J811 Chronic pulmonary edema: Secondary | ICD-10-CM | POA: Diagnosis not present

## 2020-11-07 DIAGNOSIS — I509 Heart failure, unspecified: Secondary | ICD-10-CM | POA: Diagnosis not present

## 2020-11-07 DIAGNOSIS — J40 Bronchitis, not specified as acute or chronic: Secondary | ICD-10-CM | POA: Diagnosis not present

## 2020-11-07 DIAGNOSIS — J90 Pleural effusion, not elsewhere classified: Secondary | ICD-10-CM | POA: Diagnosis not present

## 2020-11-07 DIAGNOSIS — R0602 Shortness of breath: Secondary | ICD-10-CM | POA: Diagnosis not present

## 2020-11-07 DIAGNOSIS — R9431 Abnormal electrocardiogram [ECG] [EKG]: Secondary | ICD-10-CM | POA: Diagnosis not present

## 2020-11-07 DIAGNOSIS — I10 Essential (primary) hypertension: Secondary | ICD-10-CM | POA: Diagnosis not present

## 2020-11-07 DIAGNOSIS — R931 Abnormal findings on diagnostic imaging of heart and coronary circulation: Secondary | ICD-10-CM | POA: Diagnosis not present

## 2020-11-10 DIAGNOSIS — Z6836 Body mass index (BMI) 36.0-36.9, adult: Secondary | ICD-10-CM | POA: Diagnosis not present

## 2020-11-10 DIAGNOSIS — I5021 Acute systolic (congestive) heart failure: Secondary | ICD-10-CM | POA: Diagnosis not present

## 2020-11-14 DIAGNOSIS — I272 Pulmonary hypertension, unspecified: Secondary | ICD-10-CM | POA: Diagnosis not present

## 2020-11-14 DIAGNOSIS — R059 Cough, unspecified: Secondary | ICD-10-CM | POA: Diagnosis not present

## 2020-11-14 DIAGNOSIS — I081 Rheumatic disorders of both mitral and tricuspid valves: Secondary | ICD-10-CM | POA: Diagnosis not present

## 2020-11-14 DIAGNOSIS — J984 Other disorders of lung: Secondary | ICD-10-CM | POA: Diagnosis not present

## 2020-11-14 DIAGNOSIS — I700 Atherosclerosis of aorta: Secondary | ICD-10-CM | POA: Diagnosis not present

## 2020-11-14 DIAGNOSIS — I129 Hypertensive chronic kidney disease with stage 1 through stage 4 chronic kidney disease, or unspecified chronic kidney disease: Secondary | ICD-10-CM | POA: Diagnosis not present

## 2020-11-14 DIAGNOSIS — R918 Other nonspecific abnormal finding of lung field: Secondary | ICD-10-CM | POA: Diagnosis not present

## 2020-11-14 DIAGNOSIS — M7989 Other specified soft tissue disorders: Secondary | ICD-10-CM | POA: Diagnosis not present

## 2020-11-14 DIAGNOSIS — J189 Pneumonia, unspecified organism: Secondary | ICD-10-CM | POA: Diagnosis not present

## 2020-11-14 DIAGNOSIS — N289 Disorder of kidney and ureter, unspecified: Secondary | ICD-10-CM | POA: Diagnosis not present

## 2020-11-14 DIAGNOSIS — R9431 Abnormal electrocardiogram [ECG] [EKG]: Secondary | ICD-10-CM | POA: Diagnosis not present

## 2020-11-14 DIAGNOSIS — E278 Other specified disorders of adrenal gland: Secondary | ICD-10-CM | POA: Diagnosis not present

## 2020-11-14 DIAGNOSIS — E049 Nontoxic goiter, unspecified: Secondary | ICD-10-CM | POA: Diagnosis not present

## 2020-11-14 DIAGNOSIS — E279 Disorder of adrenal gland, unspecified: Secondary | ICD-10-CM | POA: Diagnosis not present

## 2020-11-14 DIAGNOSIS — N189 Chronic kidney disease, unspecified: Secondary | ICD-10-CM | POA: Diagnosis not present

## 2020-11-14 DIAGNOSIS — N184 Chronic kidney disease, stage 4 (severe): Secondary | ICD-10-CM | POA: Diagnosis not present

## 2020-11-14 DIAGNOSIS — N281 Cyst of kidney, acquired: Secondary | ICD-10-CM | POA: Diagnosis not present

## 2020-11-14 DIAGNOSIS — N179 Acute kidney failure, unspecified: Secondary | ICD-10-CM | POA: Diagnosis not present

## 2020-11-14 DIAGNOSIS — I2510 Atherosclerotic heart disease of native coronary artery without angina pectoris: Secondary | ICD-10-CM | POA: Diagnosis not present

## 2020-11-14 DIAGNOSIS — R778 Other specified abnormalities of plasma proteins: Secondary | ICD-10-CM | POA: Diagnosis not present

## 2020-11-14 DIAGNOSIS — M109 Gout, unspecified: Secondary | ICD-10-CM | POA: Diagnosis not present

## 2020-11-14 DIAGNOSIS — Z20822 Contact with and (suspected) exposure to covid-19: Secondary | ICD-10-CM | POA: Diagnosis not present

## 2020-11-14 DIAGNOSIS — I313 Pericardial effusion (noninflammatory): Secondary | ICD-10-CM | POA: Diagnosis not present

## 2020-11-14 DIAGNOSIS — R0600 Dyspnea, unspecified: Secondary | ICD-10-CM | POA: Diagnosis not present

## 2020-11-14 DIAGNOSIS — I130 Hypertensive heart and chronic kidney disease with heart failure and stage 1 through stage 4 chronic kidney disease, or unspecified chronic kidney disease: Secondary | ICD-10-CM | POA: Diagnosis not present

## 2020-11-14 DIAGNOSIS — J90 Pleural effusion, not elsewhere classified: Secondary | ICD-10-CM | POA: Diagnosis not present

## 2020-11-14 DIAGNOSIS — I509 Heart failure, unspecified: Secondary | ICD-10-CM | POA: Diagnosis not present

## 2020-11-14 DIAGNOSIS — E1122 Type 2 diabetes mellitus with diabetic chronic kidney disease: Secondary | ICD-10-CM | POA: Diagnosis not present

## 2020-11-16 DIAGNOSIS — R0600 Dyspnea, unspecified: Secondary | ICD-10-CM | POA: Diagnosis not present

## 2020-11-16 DIAGNOSIS — M7989 Other specified soft tissue disorders: Secondary | ICD-10-CM | POA: Diagnosis not present

## 2020-11-19 DIAGNOSIS — N184 Chronic kidney disease, stage 4 (severe): Secondary | ICD-10-CM | POA: Diagnosis not present

## 2020-11-19 DIAGNOSIS — E278 Other specified disorders of adrenal gland: Secondary | ICD-10-CM | POA: Insufficient documentation

## 2020-11-19 DIAGNOSIS — J984 Other disorders of lung: Secondary | ICD-10-CM | POA: Diagnosis not present

## 2020-11-19 DIAGNOSIS — R778 Other specified abnormalities of plasma proteins: Secondary | ICD-10-CM | POA: Diagnosis not present

## 2020-11-19 DIAGNOSIS — I129 Hypertensive chronic kidney disease with stage 1 through stage 4 chronic kidney disease, or unspecified chronic kidney disease: Secondary | ICD-10-CM | POA: Diagnosis not present

## 2020-11-23 DIAGNOSIS — R0600 Dyspnea, unspecified: Secondary | ICD-10-CM | POA: Diagnosis not present

## 2020-11-23 DIAGNOSIS — R059 Cough, unspecified: Secondary | ICD-10-CM | POA: Diagnosis not present

## 2020-11-24 DIAGNOSIS — R0602 Shortness of breath: Secondary | ICD-10-CM | POA: Diagnosis not present

## 2020-11-24 DIAGNOSIS — D631 Anemia in chronic kidney disease: Secondary | ICD-10-CM | POA: Diagnosis not present

## 2020-11-24 DIAGNOSIS — J440 Chronic obstructive pulmonary disease with acute lower respiratory infection: Secondary | ICD-10-CM | POA: Diagnosis not present

## 2020-11-24 DIAGNOSIS — I130 Hypertensive heart and chronic kidney disease with heart failure and stage 1 through stage 4 chronic kidney disease, or unspecified chronic kidney disease: Secondary | ICD-10-CM | POA: Diagnosis not present

## 2020-11-24 DIAGNOSIS — N170 Acute kidney failure with tubular necrosis: Secondary | ICD-10-CM | POA: Diagnosis not present

## 2020-11-24 DIAGNOSIS — I5023 Acute on chronic systolic (congestive) heart failure: Secondary | ICD-10-CM | POA: Diagnosis not present

## 2020-11-24 DIAGNOSIS — N185 Chronic kidney disease, stage 5: Secondary | ICD-10-CM | POA: Diagnosis not present

## 2020-11-24 DIAGNOSIS — D3502 Benign neoplasm of left adrenal gland: Secondary | ICD-10-CM | POA: Diagnosis not present

## 2020-11-24 DIAGNOSIS — Z9889 Other specified postprocedural states: Secondary | ICD-10-CM | POA: Diagnosis not present

## 2020-11-24 DIAGNOSIS — D472 Monoclonal gammopathy: Secondary | ICD-10-CM | POA: Diagnosis not present

## 2020-11-24 DIAGNOSIS — J156 Pneumonia due to other aerobic Gram-negative bacteria: Secondary | ICD-10-CM | POA: Diagnosis not present

## 2020-11-24 DIAGNOSIS — I5021 Acute systolic (congestive) heart failure: Secondary | ICD-10-CM | POA: Diagnosis not present

## 2020-11-24 DIAGNOSIS — N186 End stage renal disease: Secondary | ICD-10-CM | POA: Diagnosis not present

## 2020-11-24 DIAGNOSIS — I110 Hypertensive heart disease with heart failure: Secondary | ICD-10-CM | POA: Diagnosis not present

## 2020-11-24 DIAGNOSIS — D649 Anemia, unspecified: Secondary | ICD-10-CM | POA: Diagnosis not present

## 2020-11-24 DIAGNOSIS — I132 Hypertensive heart and chronic kidney disease with heart failure and with stage 5 chronic kidney disease, or end stage renal disease: Secondary | ICD-10-CM | POA: Diagnosis not present

## 2020-11-24 DIAGNOSIS — Z87891 Personal history of nicotine dependence: Secondary | ICD-10-CM | POA: Diagnosis not present

## 2020-11-24 DIAGNOSIS — Z79899 Other long term (current) drug therapy: Secondary | ICD-10-CM | POA: Diagnosis not present

## 2020-11-24 DIAGNOSIS — E278 Other specified disorders of adrenal gland: Secondary | ICD-10-CM | POA: Diagnosis not present

## 2020-11-24 DIAGNOSIS — Z803 Family history of malignant neoplasm of breast: Secondary | ICD-10-CM | POA: Diagnosis not present

## 2020-11-24 DIAGNOSIS — J90 Pleural effusion, not elsewhere classified: Secondary | ICD-10-CM | POA: Diagnosis not present

## 2020-11-24 DIAGNOSIS — Z01810 Encounter for preprocedural cardiovascular examination: Secondary | ICD-10-CM | POA: Diagnosis not present

## 2020-11-24 DIAGNOSIS — I50810 Right heart failure, unspecified: Secondary | ICD-10-CM | POA: Diagnosis not present

## 2020-11-24 DIAGNOSIS — R809 Proteinuria, unspecified: Secondary | ICD-10-CM | POA: Diagnosis not present

## 2020-11-24 DIAGNOSIS — E1122 Type 2 diabetes mellitus with diabetic chronic kidney disease: Secondary | ICD-10-CM | POA: Diagnosis not present

## 2020-11-24 DIAGNOSIS — N281 Cyst of kidney, acquired: Secondary | ICD-10-CM | POA: Diagnosis not present

## 2020-11-24 DIAGNOSIS — I509 Heart failure, unspecified: Secondary | ICD-10-CM | POA: Diagnosis not present

## 2020-11-24 DIAGNOSIS — J189 Pneumonia, unspecified organism: Secondary | ICD-10-CM | POA: Diagnosis not present

## 2020-11-24 DIAGNOSIS — I313 Pericardial effusion (noninflammatory): Secondary | ICD-10-CM | POA: Diagnosis not present

## 2020-11-24 DIAGNOSIS — R918 Other nonspecific abnormal finding of lung field: Secondary | ICD-10-CM | POA: Diagnosis not present

## 2020-11-24 DIAGNOSIS — Z6835 Body mass index (BMI) 35.0-35.9, adult: Secondary | ICD-10-CM | POA: Diagnosis not present

## 2020-11-24 DIAGNOSIS — N184 Chronic kidney disease, stage 4 (severe): Secondary | ICD-10-CM | POA: Diagnosis not present

## 2020-11-24 DIAGNOSIS — E119 Type 2 diabetes mellitus without complications: Secondary | ICD-10-CM | POA: Diagnosis not present

## 2020-11-24 DIAGNOSIS — E279 Disorder of adrenal gland, unspecified: Secondary | ICD-10-CM | POA: Diagnosis not present

## 2020-11-24 DIAGNOSIS — N189 Chronic kidney disease, unspecified: Secondary | ICD-10-CM | POA: Diagnosis not present

## 2020-11-24 DIAGNOSIS — I2510 Atherosclerotic heart disease of native coronary artery without angina pectoris: Secondary | ICD-10-CM | POA: Diagnosis not present

## 2020-11-24 DIAGNOSIS — S37009A Unspecified injury of unspecified kidney, initial encounter: Secondary | ICD-10-CM | POA: Diagnosis not present

## 2020-11-27 DIAGNOSIS — N185 Chronic kidney disease, stage 5: Secondary | ICD-10-CM | POA: Diagnosis not present

## 2020-11-27 DIAGNOSIS — D472 Monoclonal gammopathy: Secondary | ICD-10-CM | POA: Diagnosis not present

## 2020-11-27 DIAGNOSIS — I129 Hypertensive chronic kidney disease with stage 1 through stage 4 chronic kidney disease, or unspecified chronic kidney disease: Secondary | ICD-10-CM | POA: Diagnosis not present

## 2020-11-27 DIAGNOSIS — E1122 Type 2 diabetes mellitus with diabetic chronic kidney disease: Secondary | ICD-10-CM | POA: Diagnosis not present

## 2020-11-27 DIAGNOSIS — D638 Anemia in other chronic diseases classified elsewhere: Secondary | ICD-10-CM | POA: Diagnosis not present

## 2020-11-27 DIAGNOSIS — E211 Secondary hyperparathyroidism, not elsewhere classified: Secondary | ICD-10-CM | POA: Diagnosis not present

## 2020-11-27 DIAGNOSIS — I5032 Chronic diastolic (congestive) heart failure: Secondary | ICD-10-CM | POA: Diagnosis not present

## 2020-12-09 ENCOUNTER — Other Ambulatory Visit: Payer: Self-pay

## 2020-12-09 DIAGNOSIS — N184 Chronic kidney disease, stage 4 (severe): Secondary | ICD-10-CM

## 2020-12-10 ENCOUNTER — Ambulatory Visit (INDEPENDENT_AMBULATORY_CARE_PROVIDER_SITE_OTHER): Payer: PPO

## 2020-12-10 ENCOUNTER — Other Ambulatory Visit: Payer: Self-pay

## 2020-12-10 DIAGNOSIS — N184 Chronic kidney disease, stage 4 (severe): Secondary | ICD-10-CM

## 2020-12-12 DIAGNOSIS — E211 Secondary hyperparathyroidism, not elsewhere classified: Secondary | ICD-10-CM | POA: Diagnosis not present

## 2020-12-12 DIAGNOSIS — I5032 Chronic diastolic (congestive) heart failure: Secondary | ICD-10-CM | POA: Diagnosis not present

## 2020-12-12 DIAGNOSIS — N185 Chronic kidney disease, stage 5: Secondary | ICD-10-CM | POA: Diagnosis not present

## 2020-12-12 DIAGNOSIS — D472 Monoclonal gammopathy: Secondary | ICD-10-CM | POA: Diagnosis not present

## 2020-12-12 DIAGNOSIS — I129 Hypertensive chronic kidney disease with stage 1 through stage 4 chronic kidney disease, or unspecified chronic kidney disease: Secondary | ICD-10-CM | POA: Diagnosis not present

## 2020-12-12 DIAGNOSIS — E1122 Type 2 diabetes mellitus with diabetic chronic kidney disease: Secondary | ICD-10-CM | POA: Diagnosis not present

## 2020-12-12 DIAGNOSIS — D638 Anemia in other chronic diseases classified elsewhere: Secondary | ICD-10-CM | POA: Diagnosis not present

## 2020-12-17 ENCOUNTER — Other Ambulatory Visit: Payer: Self-pay

## 2020-12-17 ENCOUNTER — Encounter: Payer: Self-pay | Admitting: Vascular Surgery

## 2020-12-17 ENCOUNTER — Ambulatory Visit: Payer: PPO | Admitting: Vascular Surgery

## 2020-12-17 VITALS — BP 186/96 | HR 76 | Temp 98.1°F | Ht 72.0 in | Wt 263.0 lb

## 2020-12-17 DIAGNOSIS — N184 Chronic kidney disease, stage 4 (severe): Secondary | ICD-10-CM | POA: Diagnosis not present

## 2020-12-17 DIAGNOSIS — E1122 Type 2 diabetes mellitus with diabetic chronic kidney disease: Secondary | ICD-10-CM | POA: Diagnosis not present

## 2020-12-17 DIAGNOSIS — I129 Hypertensive chronic kidney disease with stage 1 through stage 4 chronic kidney disease, or unspecified chronic kidney disease: Secondary | ICD-10-CM | POA: Diagnosis not present

## 2020-12-17 DIAGNOSIS — R600 Localized edema: Secondary | ICD-10-CM | POA: Diagnosis not present

## 2020-12-17 DIAGNOSIS — D638 Anemia in other chronic diseases classified elsewhere: Secondary | ICD-10-CM | POA: Diagnosis not present

## 2020-12-17 DIAGNOSIS — E211 Secondary hyperparathyroidism, not elsewhere classified: Secondary | ICD-10-CM | POA: Diagnosis not present

## 2020-12-17 DIAGNOSIS — I5032 Chronic diastolic (congestive) heart failure: Secondary | ICD-10-CM | POA: Diagnosis not present

## 2020-12-17 DIAGNOSIS — D472 Monoclonal gammopathy: Secondary | ICD-10-CM | POA: Diagnosis not present

## 2020-12-17 DIAGNOSIS — R808 Other proteinuria: Secondary | ICD-10-CM | POA: Diagnosis not present

## 2020-12-17 DIAGNOSIS — N185 Chronic kidney disease, stage 5: Secondary | ICD-10-CM | POA: Diagnosis not present

## 2020-12-17 NOTE — Progress Notes (Signed)
[facility] [place]  Patient name: [name] [name] MRN: [id number] DOB: [birth year] Sex: male  REASON FOR CONSULT: Evaluation for access for hemodialysis  HPI: [name] [name] is a 70 y.o. male, who is here today for evaluation.  He has had progressive renal insufficiency and now is in need for initiating hemodialysis.  His last creatinine was over 8.  He is not on anticoagulant.  He is right-handed.  He does not have a pacemaker.  History reviewed. No pertinent past medical history.  History reviewed. No pertinent family history.  SOCIAL HISTORY: Social History   Socioeconomic History   Marital status: Married    Spouse name: Not on file   Number of children: Not on file   Years of education: Not on file   Highest education level: Not on file  Occupational History   Not on file  Tobacco Use   Smoking status: Never   Smokeless tobacco: Never  Vaping Use   Vaping Use: Never used  Substance and Sexual Activity   Alcohol use: Not on file   Drug use: Not on file   Sexual activity: Not on file  Other Topics Concern   Not on file  Social History Narrative   Not on file   Social Determinants of Health   Financial Resource Strain: Not on file  Food Insecurity: Not on file  Transportation Needs: Not on file  Physical Activity: Not on file  Stress: Not on file  Social Connections: Not on file  Intimate Partner Violence: Not on file    No Known Allergies  Current Outpatient Medications  Medication Sig Dispense Refill   albuterol (VENTOLIN HFA) 108 (90 Base) MCG/ACT inhaler INHALE 2 PUFFS BY MOUTH EVERY 4 HOURS AS NEEDED ONLY  IF  YOU  CANT  CATCH  YOUR  BREATH 8 g 0   allopurinol (ZYLOPRIM) 300 MG tablet Take 300 mg by mouth daily.     bisoprolol-hydrochlorothiazide (ZIAC) 10-6.25 MG tablet Take 1 tablet by mouth daily.     chlorthalidone (HYGROTON) 25 MG tablet Take 25 mg by mouth daily.      Fluticasone-Salmeterol (ADVAIR) 250-50 MCG/DOSE AEPB Inhale 1 puff into the lungs 2 (two) times daily.     glimepiride (AMARYL) 4 MG tablet Take 4 mg by mouth daily with breakfast.     hydrALAZINE (APRESOLINE) 50 MG tablet Take 50 mg by mouth. Patient states he takes 1 tablet 2 times per day     insulin aspart (NOVOLOG) 100 UNIT/ML injection Inject into the skin 3 (three) times daily with meals. Patient states takes 5 units before meals     insulin detemir (LEVEMIR) 100 UNIT/ML injection Inject into the skin at bedtime. Patient states he takes 10 units at bedtime.     empagliflozin (JARDIANCE) 25 MG TABS tablet Take 25 mg by mouth daily. (Patient not taking: Reported on 12/17/2020)     losartan (COZAAR) 100 MG tablet Take 100 mg by mouth daily. (Patient not taking: Reported on 12/17/2020)     No current facility-administered medications for this visit.    REVIEW OF SYSTEMS:  [X]  denotes positive finding, [ ]  denotes negative finding Cardiac  Comments:  Chest pain or chest pressure:    Shortness of breath upon exertion:    Short of breath when lying flat: x   Irregular heart rhythm:        Vascular    Pain in calf, thigh, or hip brought on by ambulation:    Pain  in feet at night that wakes you up from your sleep:     Blood clot in your veins:    Leg swelling:  x       Pulmonary    Oxygen at home:    Productive cough:     Wheezing:  x       Neurologic    Sudden weakness in arms or legs:     Sudden numbness in arms or legs:     Sudden onset of difficulty speaking or slurred speech:    Temporary loss of vision in one eye:     Problems with dizziness:         Gastrointestinal    Blood in stool:     Vomited blood:         Genitourinary    Burning when urinating:     Blood in urine:        Psychiatric    Major depression:         Hematologic    Bleeding problems:    Problems with blood clotting too easily:        Skin    Rashes or ulcers: x       Constitutional    Fever  or chills:      PHYSICAL EXAM: Vitals:   12/17/20 1427  BP: (!) 186/96  Pulse: 76  Temp: 98.1 F (36.7 C)  TempSrc: Temporal  SpO2: 97%  Weight: 263 lb (119.3 kg)  Height: 6' (1.829 m)    GENERAL: The patient is a well-nourished male, in no acute distress. The vital signs are documented above. CARDIOVASCULAR: 2+ radial pulses bilaterally.  Small surface veins in his arms bilaterally. PULMONARY: There is good air exchange  MUSCULOSKELETAL: There are no major deformities or cyanosis. NEUROLOGIC: No focal weakness or paresthesias are detected. SKIN: There are no ulcers or rashes noted. PSYCHIATRIC: The patient has a normal affect.  DATA:  Duplex reveals moderate size cephalic veins bilaterally and larger basilic veins bilaterally  MEDICAL ISSUES: Had long discussion with the patient regarding access for hemodialysis.  I spoke with Dr. Theador Hawthorne today who requests placement of catheter for immediate hemodialysis and arm access to soon as possible.  I described catheter placement and potential risk to include infection.  Also discussed AV fistula and AV graft placement and the limitations of each of these.  We will plan tunneled hemodialysis catheter and left arm AV graft versus fistula on 12/23/2020 at [facility]   Rosetta Posner, MD [facility] Tel [phone] Pager [phone]  Note: Portions of this report may have been transcribed using voice recognition software.  Every effort has been made to ensure accuracy; however, inadvertent computerized transcription errors may still be present.

## 2020-12-17 NOTE — H&P (View-Only) (Signed)
[facility] [place]  Patient name: [name] [name] MRN: [id number] DOB: 1951-04-04 Sex: male  REASON FOR CONSULT: Evaluation for access for hemodialysis  HPI: [name] [name] is a 70 y.o. male, who is here today for evaluation.  He has had progressive renal insufficiency and now is in need for initiating hemodialysis.  His last creatinine was over 8.  He is not on anticoagulant.  He is right-handed.  He does not have a pacemaker.  History reviewed. No pertinent past medical history.  History reviewed. No pertinent family history.  SOCIAL HISTORY: Social History   Socioeconomic History   Marital status: Married    Spouse name: Not on file   Number of children: Not on file   Years of education: Not on file   Highest education level: Not on file  Occupational History   Not on file  Tobacco Use   Smoking status: Never   Smokeless tobacco: Never  Vaping Use   Vaping Use: Never used  Substance and Sexual Activity   Alcohol use: Not on file   Drug use: Not on file   Sexual activity: Not on file  Other Topics Concern   Not on file  Social History Narrative   Not on file   Social Determinants of Health   Financial Resource Strain: Not on file  Food Insecurity: Not on file  Transportation Needs: Not on file  Physical Activity: Not on file  Stress: Not on file  Social Connections: Not on file  Intimate Partner Violence: Not on file    No Known Allergies  Current Outpatient Medications  Medication Sig Dispense Refill   albuterol (VENTOLIN HFA) 108 (90 Base) MCG/ACT inhaler INHALE 2 PUFFS BY MOUTH EVERY 4 HOURS AS NEEDED ONLY  IF  YOU  CANT  CATCH  YOUR  BREATH 8 g 0   allopurinol (ZYLOPRIM) 300 MG tablet Take 300 mg by mouth daily.     bisoprolol-hydrochlorothiazide (ZIAC) 10-6.25 MG tablet Take 1 tablet by mouth daily.     chlorthalidone (HYGROTON) 25 MG tablet Take 25 mg by mouth daily.      Fluticasone-Salmeterol (ADVAIR) 250-50 MCG/DOSE AEPB Inhale 1 puff into the lungs 2 (two) times daily.     glimepiride (AMARYL) 4 MG tablet Take 4 mg by mouth daily with breakfast.     hydrALAZINE (APRESOLINE) 50 MG tablet Take 50 mg by mouth. Patient states he takes 1 tablet 2 times per day     insulin aspart (NOVOLOG) 100 UNIT/ML injection Inject into the skin 3 (three) times daily with meals. Patient states takes 5 units before meals     insulin detemir (LEVEMIR) 100 UNIT/ML injection Inject into the skin at bedtime. Patient states he takes 10 units at bedtime.     empagliflozin (JARDIANCE) 25 MG TABS tablet Take 25 mg by mouth daily. (Patient not taking: Reported on 12/17/2020)     losartan (COZAAR) 100 MG tablet Take 100 mg by mouth daily. (Patient not taking: Reported on 12/17/2020)     No current facility-administered medications for this visit.    REVIEW OF SYSTEMS:  [X]  denotes positive finding, [ ]  denotes negative finding Cardiac  Comments:  Chest pain or chest pressure:    Shortness of breath upon exertion:    Short of breath when lying flat: x   Irregular heart rhythm:        Vascular    Pain in calf, thigh, or hip brought on by ambulation:    Pain  in feet at night that wakes you up from your sleep:     Blood clot in your veins:    Leg swelling:  x       Pulmonary    Oxygen at home:    Productive cough:     Wheezing:  x       Neurologic    Sudden weakness in arms or legs:     Sudden numbness in arms or legs:     Sudden onset of difficulty speaking or slurred speech:    Temporary loss of vision in one eye:     Problems with dizziness:         Gastrointestinal    Blood in stool:     Vomited blood:         Genitourinary    Burning when urinating:     Blood in urine:        Psychiatric    Major depression:         Hematologic    Bleeding problems:    Problems with blood clotting too easily:        Skin    Rashes or ulcers: x       Constitutional    Fever  or chills:      PHYSICAL EXAM: Vitals:   12/17/20 1427  BP: (!) 186/96  Pulse: 76  Temp: 98.1 F (36.7 C)  TempSrc: Temporal  SpO2: 97%  Weight: 263 lb (119.3 kg)  Height: 6' (1.829 m)    GENERAL: The patient is a well-nourished male, in no acute distress. The vital signs are documented above. CARDIOVASCULAR: 2+ radial pulses bilaterally.  Small surface veins in his arms bilaterally. PULMONARY: There is good air exchange  MUSCULOSKELETAL: There are no major deformities or cyanosis. NEUROLOGIC: No focal weakness or paresthesias are detected. SKIN: There are no ulcers or rashes noted. PSYCHIATRIC: The patient has a normal affect.  DATA:  Duplex reveals moderate size cephalic veins bilaterally and larger basilic veins bilaterally  MEDICAL ISSUES: Had long discussion with the patient regarding access for hemodialysis.  I spoke with Dr. Theador Hawthorne today who requests placement of catheter for immediate hemodialysis and arm access to soon as possible.  I described catheter placement and potential risk to include infection.  Also discussed AV fistula and AV graft placement and the limitations of each of these.  We will plan tunneled hemodialysis catheter and left arm AV graft versus fistula on 12/23/2020 at [facility]   Rosetta Posner, MD [facility] Tel [phone] Pager [phone]  Note: Portions of this report may have been transcribed using voice recognition software.  Every effort has been made to ensure accuracy; however, inadvertent computerized transcription errors may still be present.

## 2020-12-18 ENCOUNTER — Other Ambulatory Visit: Payer: Self-pay | Admitting: *Deleted

## 2020-12-18 NOTE — Progress Notes (Signed)
[place] CONSULT NOTE  Patient Care Team: Neale Burly, MD as PCP - General (Internal Medicine)  CHIEF COMPLAINTS/PURPOSE OF CONSULTATION:  MGUS  ASSESSMENT & PLAN:   This is a very pleasant 70 year old male patient with past medical history significant for diabetes, hypertension referred to hematology with concerns of MGUS.  He was also found to have nephrotic range proteinuria, CKD with creatinine of 5.3, end-stage renal disease.  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  Review of systems most significant for severe fatigue and bilateral lower extremity pitting pedal edema. Physical examination, no macroglossia, no lymphadenopathy or hepatosplenomegaly.  Severe 3+ pitting edema noticed in the entire lower extremities. Have reviewed his labs.  His nephrotic range proteinuria, has abnormal kappa lambda ratio with free kappa light chain, questionable cardiac involvement make me wonder if he has systemic amyloidosis.  I have discussed about systemic amyloidosis and the needed evaluation today with the patient.  He will proceed with labs, bone marrow aspiration and biopsy with Congo red staining, repeat echo with specific attention to amyloid findings in the heart and return to clinic in a couple weeks to review results and to discuss any additional recommendations.  He is agreeable to all his evaluation.  He understands that renal dysfunction could be permanent in this case.  Orders Placed This Encounter  Procedures   CT BIOPSY    Standing Status:   Future    Standing Expiration Date:   12/19/2021    Order Specific Question:   Lab orders requested (DO NOT place separate lab orders, these will be automatically ordered during procedure specimen collection):    Answer:   Surgical Pathology    Order Specific Question:   Reason for Exam (SYMPTOM  OR DIAGNOSIS REQUIRED)    Answer:   bone marrow, suspect amyloidosis    Order  Specific Question:   Preferred imaging location?    Answer:   [facility]    Order Specific Question:   Radiology Contrast Protocol - do NOT remove file path    Answer:   \\[email or web address]   CT BONE MARROW BIOPSY & ASPIRATION    Standing Status:   Future    Standing Expiration Date:   12/19/2021    Order Specific Question:   Reason for Exam (SYMPTOM  OR DIAGNOSIS REQUIRED)    Answer:   Suspect systemic amyloidosis, will need congo red staining.    Order Specific Question:   Preferred imaging location?    Answer:   [facility]    Order Specific Question:   Radiology Contrast Protocol - do NOT remove file path    Answer:   \\[email or web address]   CBC with Differential/Platelet    Standing Status:   Standing    Number of Occurrences:   22    Standing Expiration Date:   12/19/2021   Comprehensive metabolic panel    Standing Status:   Standing    Number of Occurrences:   33    Standing Expiration Date:   12/19/2021   SPEP (Serum protein electrophoresis)    Standing Status:   Future    Number of Occurrences:   1    Standing Expiration Date:   12/19/2021   UPEP/TP, 24-Hr Urine    Standing Status:   Future    Standing Expiration Date:   12/19/2021   Kappa/lambda light chains    Standing Status:   Future    Number of  Occurrences:   1    Standing Expiration Date:   12/19/2021   IgG, IgA, IgM    Standing Status:   Future    Number of Occurrences:   1    Standing Expiration Date:   12/19/2021   Protime-INR    Standing Status:   Future    Number of Occurrences:   1    Standing Expiration Date:   12/19/2021   APTT    Standing Status:   Future    Number of Occurrences:   1    Standing Expiration Date:   12/19/2021   Troponin T   Cardio IQ NT ProBNP   ECHOCARDIOGRAM COMPLETE    Standing Status:   Future    Standing Expiration Date:   12/19/2021    Order Specific Question:   Where should this test be performed    Answer:   [place]     Order Specific Question:   Perflutren DEFINITY (image enhancing agent) should be administered unless hypersensitivity or allergy exist    Answer:   Administer Perflutren    Order Specific Question:   Is a special reader required? 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  Please not hesitate to contact us with any additional questions or concerns.  HISTORY OF PRESENTING ILLNESS:   [name] [name] 70 y.o. male is here because of MGUS.  This is a very pleasant 70 year old male patient with past medical history significant for diabetes, CHF, end-stage renal disease diagnosed recently presented with abnormal labs and concern for MGUS.  According to the patient, he has been feeling exhausted for the past 1 year and has also had bilateral lower extremity swelling.  He however mentions that he has had lower extremity swelling for over 20+ years and he does not know if they have gotten any worse in the past 1 year.  He denies any fevers, drenching night sweats.  He has lost about 80 pounds of weight in the past year and he admits that this is all intentional.  He denies any easy bruising or bleeding.  No change in swallowing, bowel habits or urinary habits.  He does notice that he gets very short of breath with minimal exertion.   No episodes of syncope, tingling or numbness concerning for peripheral or autonomic neuropathy. Rest of the pertinent 10 point ROS reviewed and negative.   MEDICAL HISTORY:  Past Medical History:  Diagnosis Date   Asthma    CHF (congestive heart failure) ([place])    Diabetes mellitus without  complication ([place])     SURGICAL HISTORY: Past Surgical History:  Procedure Laterality Date   ABCESS DRAINAGE     on back - possible spider bite   COLONOSCOPY      SOCIAL HISTORY: Social History   Socioeconomic History   Marital status: Married    Spouse name: Not on file   Number of children: Not on file   Years of education: Not on file   Highest education level: Not  on file  Occupational History   Not on file  Tobacco Use   Smoking status: Never   Smokeless tobacco: Never  Vaping Use   Vaping Use: Never used  Substance and Sexual Activity   Alcohol use: Not Currently   Drug use: Never   Sexual activity: Not Currently  Other Topics Concern   Not on file  Social History Narrative   Not on file   Social Determinants of Health   Financial Resource Strain: Low Risk    Difficulty of Paying Living Expenses: Not hard at all  Food Insecurity: No Food Insecurity   Worried About Charity fundraiser in the Last Year: Never true   [place] in the Last Year: Never true  Transportation Needs: No Transportation Needs   Lack of Transportation (Medical): No   Lack of Transportation (Non-Medical): No  Physical Activity: Inactive   Days of Exercise per Week: 0 days   Minutes of Exercise per Session: 0 min  Stress: No Stress Concern Present   Feeling of Stress : Not at all  Social Connections: Moderately Isolated   Frequency of Communication with Friends and Family: More than three times a week   Frequency of Social Gatherings with Friends and Family: More than three times a week   Attends Religious Services: Never   Marine scientist or Organizations: No   Attends Music therapist: Never   Marital Status: Married  Human resources officer Violence: Not At Risk   Fear of Current or Ex-Partner: No   Emotionally Abused: No   Physically Abused: No   Sexually Abused: No    FAMILY HISTORY: Family History  Problem Relation Age of Onset   Breast cancer  Mother        died 30   Emphysema Father        died 46   Diabetes Sister    Diabetes Brother     ALLERGIES:  has No Known Allergies.  MEDICATIONS:  Current Outpatient Medications  Medication Sig Dispense Refill   albuterol (VENTOLIN HFA) 108 (90 Base) MCG/ACT inhaler INHALE 2 PUFFS BY MOUTH EVERY 4 HOURS AS NEEDED ONLY  IF  YOU  CANT  CATCH  YOUR  BREATH 8 g 0   allopurinol (ZYLOPRIM) 300 MG tablet Take 300 mg by mouth daily.     amLODipine (NORVASC) 5 MG tablet Take 5 mg by mouth daily.     calcitRIOL (ROCALTROL) 0.25 MCG capsule Take 0.25 mcg by mouth daily.     fluticasone furoate-vilanterol (BREO ELLIPTA) 100-25 MCG/INH AEPB Inhale 1 puff into the lungs daily.     furosemide (LASIX) 80 MG tablet Take 80 mg by mouth 2 (two) times daily.     hydrALAZINE (APRESOLINE) 50 MG tablet Take 50 mg by mouth 3 (three) times daily.     insulin aspart (NOVOLOG) 100 UNIT/ML injection Inject 5 Units into the skin 3 (three) times daily as needed for high blood sugar.     insulin detemir (LEVEMIR) 100 UNIT/ML injection Inject 10 Units into the skin in the morning.     sodium bicarbonate 650 MG tablet Take 650 mg by mouth 3 (three) times daily.     No current facility-administered medications for this visit.     PHYSICAL EXAMINATION:  ECOG PERFORMANCE STATUS: 1 - Symptomatic but completely ambulatory  Vitals:   12/19/20 0920  BP: (!) 184/97  Pulse: 99  Resp: 18  Temp: 97.9 F (36.6 C)  SpO2: 100%  Filed Weights   12/19/20 0920  Weight: 261 lb (118.4 kg)    GENERAL:alert, no distress and comfortable SKIN: skin color, texture, turgor are normal, no rashes or significant lesions EYES: normal, conjunctiva are pink and non-injected, sclera clear OROPHARYNX:no exudate, no erythema and lips, buccal mucosa, and tongue normal.  No macroglossia NECK: supple, thyroid normal size, non-tender, without nodularity LYMPH:  no palpable lymphadenopathy in the cervical, axillary  LUNGS: clear  to auscultation and percussion with normal breathing effort HEART: regular rate & rhythm and no murmurs. Severe 3+ bilateral lower extremity pitting edema. ABDOMEN:abdomen soft, non-tender and normal bowel sounds.  No hepatosplenomegaly. Musculoskeletal:no cyanosis of digits and no clubbing  PSYCH: alert & oriented x 3 with fluent speech NEURO: no focal motor/sensory deficits  LABORATORY DATA:  I have reviewed the data as listed Lab Results  Component Value Date   WBC 6.4 12/19/2020   HGB 9.3 (L) 12/19/2020   HCT 29.2 (L) 12/19/2020   MCV 95.7 12/19/2020   PLT 206 12/19/2020     Chemistry      Component Value Date/Time   NA 141 04/24/2018 1620   K 3.7 04/24/2018 1620   CL 106 04/24/2018 1620   CO2 25 04/24/2018 1620   BUN 60 (H) 04/24/2018 1620   CREATININE 2.51 (H) 04/24/2018 1620      Component Value Date/Time   CALCIUM 9.5 04/24/2018 1620       RADIOGRAPHIC STUDIES: I have personally reviewed the radiological images as listed and agreed with the findings in the report. VAS [place] UPPER EXTREMITY ARTERIAL DUPLEX  Result Date: 12/10/2020  UPPER EXTREMITY DUPLEX STUDY Patient Name:  [name] [name]  Date of Exam:   12/10/2020 Medical Rec #: [id number]       Accession #:    [id number] Date of Birth: [birth year]      Patient Gender: M Patient Age:   1 years Exam Location:  [facility] Procedure:      VAS [place] UPPER EXTREMITY ARTERIAL DUPLEX Referring Phys: TODD EARLY --------------------------------------------------------------------------------  Indications: Pre op HD access.  Performing Technologist: Ralene Cork RVT  Examination Guidelines: A complete evaluation includes B-mode imaging, spectral Doppler, color Doppler, and power Doppler as needed of all accessible portions of each vessel. Bilateral testing is considered an integral part of a complete examination. Limited examinations for reoccurring indications may be performed as noted.  Right Pre-Dialysis  Findings: +-----------------------+----------+--------------------+---------+--------+ Location               PSV (cm/s)Intralum. Diam. (cm)Waveform Comments +-----------------------+----------+--------------------+---------+--------+ Brachial Antecub. fossa103       0.53                triphasic         +-----------------------+----------+--------------------+---------+--------+ Radial Art at Wrist    103       0.26                triphasic         +-----------------------+----------+--------------------+---------+--------+ Ulnar Art at Wrist     73        0.14                triphasic         +-----------------------+----------+--------------------+---------+--------+  Left Pre-Dialysis Findings: +----------------------+----------+------------------+---------+---------------+ Location              PSV (cm/s)Intralum. DiamTressia Danas Comments                                        (  cm)                                       +----------------------+----------+------------------+---------+---------------+ Brachial Antecub.     120       0.43              triphasic                fossa                                                                      +----------------------+----------+------------------+---------+---------------+ Radial Art at Wrist   97        0.29              triphasicatherosclerotic +----------------------+----------+------------------+---------+---------------+ Ulnar Art at Wrist    96        0.18              triphasic                +----------------------+----------+------------------+---------+---------------+  Summary:  Right: Patent brachial, radial, and ulnar arteries. Left: Patent brachial, radial, and ulnar arteries. The distal radial       artery has circumferential atherosclerosis. *See table(s) above for measurements and observations. Electronically signed by Deitra Mayo MD on 12/10/2020 at 12:19:42 PM.    Final     VAS [place] UPPER EXT VEIN MAPPING (PRE-OP AVF)  Result Date: 12/10/2020 UPPER EXTREMITY VEIN MAPPING Patient Name:  [name] [name]  Date of Exam:   12/10/2020 Medical Rec #: [id number]       Accession #:    [id number] Date of Birth: [birth year]      Patient Gender: M Patient Age:   54 years Exam Location:  [facility] Procedure:      VAS [place] UPPER EXT VEIN MAPPING (PRE-OP AVF) Referring Phys: TODD EARLY --------------------------------------------------------------------------------  Indications: Pre-access. Performing Technologist: Ralene Cork RVT  Examination Guidelines: A complete evaluation includes B-mode imaging, spectral Doppler, color Doppler, and power Doppler as needed of all accessible portions of each vessel. Bilateral testing is considered an integral part of a complete examination. Limited examinations for reoccurring indications may be performed as noted. +-----------------+-------------+----------+--------+ Right Cephalic   Diameter (cm)Depth (cm)Findings +-----------------+-------------+----------+--------+ Shoulder             0.46                        +-----------------+-------------+----------+--------+ Prox upper arm       0.37                        +-----------------+-------------+----------+--------+ Mid upper arm        0.37                        +-----------------+-------------+----------+--------+ Dist upper arm       0.29                        +-----------------+-------------+----------+--------+ Antecubital fossa    0.31                        +-----------------+-------------+----------+--------+  Prox forearm         0.27                        +-----------------+-------------+----------+--------+ Mid forearm          0.22                        +-----------------+-------------+----------+--------+ Dist forearm         0.22                        +-----------------+-------------+----------+--------+  +-----------------+-------------+----------+--------+ Right Basilic    Diameter (cm)Depth (cm)Findings +-----------------+-------------+----------+--------+ Dist upper arm       0.59                        +-----------------+-------------+----------+--------+ Antecubital fossa    0.36                        +-----------------+-------------+----------+--------+ +-----------------+-------------+----------+--------+ Left Cephalic    Diameter (cm)Depth (cm)Findings +-----------------+-------------+----------+--------+ Shoulder             0.37                        +-----------------+-------------+----------+--------+ Prox upper arm       0.34                        +-----------------+-------------+----------+--------+ Mid upper arm        0.31                        +-----------------+-------------+----------+--------+ Dist upper arm       0.26                        +-----------------+-------------+----------+--------+ Antecubital fossa    0.32                        +-----------------+-------------+----------+--------+ Prox forearm         0.28                        +-----------------+-------------+----------+--------+ Mid forearm          0.22                        +-----------------+-------------+----------+--------+ Dist forearm         0.21                        +-----------------+-------------+----------+--------+ +-----------------+-------------+----------+--------+ Left Basilic     Diameter (cm)Depth (cm)Findings +-----------------+-------------+----------+--------+ Mid upper arm        0.57                        +-----------------+-------------+----------+--------+ Dist upper arm       0.59                        +-----------------+-------------+----------+--------+ Antecubital fossa    0.35                        +-----------------+-------------+----------+--------+ Summary: Right: Patent cephalic and basilic veins. Multiple  branching in the        forearm. Left: Patent  cephalic and basilic veins. Multiple branching in the       forearm. *See table(s) above for measurements and observations.  Diagnosing physician: Deitra Mayo MD Electronically signed by Deitra Mayo MD on 12/10/2020 at 12:19:17 PM.    Final     Labs from 07/22/2020 showed hemoglobin of 10.3 and hematocrit of 31.7.  Platelet count is normal at 278,000.  Creatinine is 5.73 consistent with chronic kidney disease.  Ferritin normal at 467. Patient was found to have extensive nephrotic range proteinuria at over 9 g Free kappa light chains at 1703.8, free lambda light chains at 74.8.  Free kappa lambda ratio at 22.78. SPEP and immunofixation shows a faint monoclonal free kappa light chain.  All questions were answered. The patient knows to call the clinic with any problems, questions or concerns. I spent 45 minutes in the care of this patient including H and P, review of records, counseling and coordination of care.     Benay Pike, MD 12/19/2020 10:49 AM

## 2020-12-19 ENCOUNTER — Encounter (HOSPITAL_COMMUNITY)
Admission: RE | Admit: 2020-12-19 | Discharge: 2020-12-19 | Disposition: A | Discharge status: Home / Self Care | Payer: PPO | Source: Ambulatory Visit | Attending: Vascular Surgery | Admitting: Vascular Surgery

## 2020-12-19 ENCOUNTER — Other Ambulatory Visit: Payer: Self-pay

## 2020-12-19 ENCOUNTER — Inpatient Hospital Stay (HOSPITAL_COMMUNITY): Payer: PPO

## 2020-12-19 ENCOUNTER — Inpatient Hospital Stay (HOSPITAL_COMMUNITY): Payer: PPO | Attending: Hematology and Oncology | Admitting: Hematology and Oncology

## 2020-12-19 ENCOUNTER — Encounter (HOSPITAL_COMMUNITY): Payer: Self-pay | Admitting: Hematology and Oncology

## 2020-12-19 VITALS — BP 184/97 | HR 99 | Temp 97.9°F | Resp 18 | Wt 261.0 lb

## 2020-12-19 DIAGNOSIS — D8989 Other specified disorders involving the immune mechanism, not elsewhere classified: Secondary | ICD-10-CM

## 2020-12-19 DIAGNOSIS — Z803 Family history of malignant neoplasm of breast: Secondary | ICD-10-CM | POA: Insufficient documentation

## 2020-12-19 DIAGNOSIS — D472 Monoclonal gammopathy: Secondary | ICD-10-CM

## 2020-12-19 DIAGNOSIS — R609 Edema, unspecified: Secondary | ICD-10-CM | POA: Diagnosis not present

## 2020-12-19 DIAGNOSIS — R808 Other proteinuria: Secondary | ICD-10-CM | POA: Diagnosis not present

## 2020-12-19 DIAGNOSIS — R5383 Other fatigue: Secondary | ICD-10-CM | POA: Diagnosis not present

## 2020-12-19 LAB — CBC WITH DIFFERENTIAL/PLATELET
Abs Immature Granulocytes: 0.02 10*3/uL (ref 0.00–0.07)
Basophils Absolute: 0 10*3/uL (ref 0.0–0.1)
Basophils Relative: 1 %
Eosinophils Absolute: 0.2 10*3/uL (ref 0.0–0.5)
Eosinophils Relative: 3 %
HCT: 29.2 % — ABNORMAL LOW (ref 39.0–52.0)
Hemoglobin: 9.3 g/dL — ABNORMAL LOW (ref 13.0–17.0)
Immature Granulocytes: 0 %
Lymphocytes Relative: 15 %
Lymphs Abs: 0.9 10*3/uL (ref 0.7–4.0)
MCH: 30.5 pg (ref 26.0–34.0)
MCHC: 31.8 g/dL (ref 30.0–36.0)
MCV: 95.7 fL (ref 80.0–100.0)
Monocytes Absolute: 0.4 10*3/uL (ref 0.1–1.0)
Monocytes Relative: 6 %
Neutro Abs: 4.8 10*3/uL (ref 1.7–7.7)
Neutrophils Relative %: 75 %
Platelets: 206 10*3/uL (ref 150–400)
RBC: 3.05 MIL/uL — ABNORMAL LOW (ref 4.22–5.81)
RDW: 13.7 % (ref 11.5–15.5)
WBC: 6.4 10*3/uL (ref 4.0–10.5)
nRBC: 0 % (ref 0.0–0.2)

## 2020-12-19 LAB — COMPREHENSIVE METABOLIC PANEL
ALT: 20 U/L (ref 0–44)
AST: 23 U/L (ref 15–41)
Albumin: 3.4 g/dL — ABNORMAL LOW (ref 3.5–5.0)
Alkaline Phosphatase: 75 U/L (ref 38–126)
Anion gap: 11 (ref 5–15)
BUN: 96 mg/dL — ABNORMAL HIGH (ref 8–23)
CO2: 19 mmol/L — ABNORMAL LOW (ref 22–32)
Calcium: 8.4 mg/dL — ABNORMAL LOW (ref 8.9–10.3)
Chloride: 114 mmol/L — ABNORMAL HIGH (ref 98–111)
Creatinine, Ser: 9.28 mg/dL — ABNORMAL HIGH (ref 0.61–1.24)
GFR, Estimated: 6 mL/min — ABNORMAL LOW (ref >60)
Glucose, Bld: 116 mg/dL — ABNORMAL HIGH (ref 70–99)
Potassium: 4.3 mmol/L (ref 3.5–5.1)
Sodium: 144 mmol/L (ref 135–145)
Total Bilirubin: 0.5 mg/dL (ref 0.3–1.2)
Total Protein: 6.4 g/dL — ABNORMAL LOW (ref 6.5–8.1)

## 2020-12-19 LAB — PROTIME-INR
INR: 1 (ref 0.8–1.2)
Prothrombin Time: 13.7 seconds (ref 11.4–15.2)

## 2020-12-19 LAB — APTT: aPTT: 30 seconds (ref 24–36)

## 2020-12-20 LAB — IGG, IGA, IGM
IgA: 129 mg/dL (ref 61–437)
IgG (Immunoglobin G), Serum: 797 mg/dL (ref 603–1613)
IgM (Immunoglobulin M), Srm: 36 mg/dL (ref 20–172)

## 2020-12-20 LAB — TROPONIN T: Troponin T (Highly Sensitive): 424 ng/L — ABNORMAL HIGH (ref 0–22)

## 2020-12-23 ENCOUNTER — Ambulatory Visit (HOSPITAL_COMMUNITY): Payer: PPO | Admitting: Anesthesiology

## 2020-12-23 ENCOUNTER — Ambulatory Visit (HOSPITAL_COMMUNITY): Payer: PPO

## 2020-12-23 ENCOUNTER — Other Ambulatory Visit: Payer: Self-pay

## 2020-12-23 ENCOUNTER — Encounter (HOSPITAL_COMMUNITY): Payer: Self-pay

## 2020-12-23 ENCOUNTER — Encounter (HOSPITAL_COMMUNITY)
Admission: RE | Disposition: A | Discharge status: Home / Self Care | Payer: Self-pay | Source: Home / Self Care | Attending: Vascular Surgery

## 2020-12-23 ENCOUNTER — Ambulatory Visit (HOSPITAL_COMMUNITY)
Admission: RE | Admit: 2020-12-23 | Discharge: 2020-12-23 | Disposition: A | Discharge status: Home / Self Care | Payer: PPO | Attending: Vascular Surgery | Admitting: Vascular Surgery

## 2020-12-23 ENCOUNTER — Encounter (HOSPITAL_COMMUNITY): Payer: Self-pay | Admitting: Vascular Surgery

## 2020-12-23 DIAGNOSIS — N186 End stage renal disease: Secondary | ICD-10-CM | POA: Insufficient documentation

## 2020-12-23 DIAGNOSIS — Z452 Encounter for adjustment and management of vascular access device: Secondary | ICD-10-CM | POA: Diagnosis not present

## 2020-12-23 DIAGNOSIS — Z7984 Long term (current) use of oral hypoglycemic drugs: Secondary | ICD-10-CM | POA: Diagnosis not present

## 2020-12-23 DIAGNOSIS — Z794 Long term (current) use of insulin: Secondary | ICD-10-CM | POA: Insufficient documentation

## 2020-12-23 DIAGNOSIS — Z992 Dependence on renal dialysis: Secondary | ICD-10-CM | POA: Diagnosis not present

## 2020-12-23 DIAGNOSIS — I132 Hypertensive heart and chronic kidney disease with heart failure and with stage 5 chronic kidney disease, or end stage renal disease: Secondary | ICD-10-CM | POA: Diagnosis not present

## 2020-12-23 DIAGNOSIS — Z79899 Other long term (current) drug therapy: Secondary | ICD-10-CM | POA: Insufficient documentation

## 2020-12-23 DIAGNOSIS — I509 Heart failure, unspecified: Secondary | ICD-10-CM | POA: Diagnosis not present

## 2020-12-23 HISTORY — PX: INSERTION OF DIALYSIS CATHETER: SHX1324

## 2020-12-23 HISTORY — PX: AV FISTULA PLACEMENT: SHX1204

## 2020-12-23 LAB — POCT I-STAT, CHEM 8
BUN: 104 mg/dL — ABNORMAL HIGH (ref 8–23)
Calcium, Ion: 1.11 mmol/L — ABNORMAL LOW (ref 1.15–1.40)
Chloride: 114 mmol/L — ABNORMAL HIGH (ref 98–111)
Creatinine, Ser: 10.2 mg/dL — ABNORMAL HIGH (ref 0.61–1.24)
Glucose, Bld: 92 mg/dL (ref 70–99)
HCT: 28 % — ABNORMAL LOW (ref 39.0–52.0)
Hemoglobin: 9.5 g/dL — ABNORMAL LOW (ref 13.0–17.0)
Potassium: 3.8 mmol/L (ref 3.5–5.1)
Sodium: 145 mmol/L (ref 135–145)
TCO2: 18 mmol/L — ABNORMAL LOW (ref 22–32)

## 2020-12-23 LAB — KAPPA/LAMBDA LIGHT CHAINS
Kappa free light chain: 1775.2 mg/L — ABNORMAL HIGH (ref 3.3–19.4)
Kappa, lambda light chain ratio: 20.4 — ABNORMAL HIGH (ref 0.26–1.65)
Lambda free light chains: 87 mg/L — ABNORMAL HIGH (ref 5.7–26.3)

## 2020-12-23 LAB — GLUCOSE, CAPILLARY: Glucose-Capillary: 84 mg/dL (ref 70–99)

## 2020-12-23 SURGERY — ARTERIOVENOUS (AV) FISTULA CREATION
Anesthesia: General | Site: Chest | Laterality: Right

## 2020-12-23 MED ORDER — FENTANYL CITRATE (PF) 100 MCG/2ML IJ SOLN
INTRAMUSCULAR | Status: AC
  Filled 2020-12-23: qty 2

## 2020-12-23 MED ORDER — HEPARIN SODIUM (PORCINE) 1000 UNIT/ML IJ SOLN
INTRAMUSCULAR | Status: DC | PRN
  Administered 2020-12-23: 4000 [IU] via INTRAVENOUS

## 2020-12-23 MED ORDER — CHLORHEXIDINE GLUCONATE 4 % EX LIQD
60.0000 mL | Freq: Once | CUTANEOUS | Status: AC
  Administered 2020-12-23: 4 via TOPICAL

## 2020-12-23 MED ORDER — ONDANSETRON HCL 4 MG/2ML IJ SOLN: 4.0000 mg | Freq: Once | INTRAMUSCULAR | Status: DC | PRN

## 2020-12-23 MED ORDER — FENTANYL CITRATE (PF) 100 MCG/2ML IJ SOLN
INTRAMUSCULAR | Status: DC | PRN
  Administered 2020-12-23 (×2): 50 ug via INTRAVENOUS

## 2020-12-23 MED ORDER — PROPOFOL 10 MG/ML IV BOLUS
INTRAVENOUS | Status: AC
  Filled 2020-12-23: qty 20

## 2020-12-23 MED ORDER — LIDOCAINE-EPINEPHRINE 0.5 %-1:200000 IJ SOLN
INTRAMUSCULAR | Status: DC | PRN
  Administered 2020-12-23: 8 mL
  Administered 2020-12-23: 10 mL

## 2020-12-23 MED ORDER — DIPHENHYDRAMINE HCL 50 MG/ML IJ SOLN
INTRAMUSCULAR | Status: DC | PRN
  Administered 2020-12-23: 12.5 mg via INTRAVENOUS

## 2020-12-23 MED ORDER — SODIUM CHLORIDE 0.9 % IV SOLN: INTRAVENOUS | Status: DC

## 2020-12-23 MED ORDER — CHLORHEXIDINE GLUCONATE 0.12 % MT SOLN
15.0000 mL | Freq: Once | OROMUCOSAL | Status: AC
  Administered 2020-12-23: 15 mL via OROMUCOSAL

## 2020-12-23 MED ORDER — MIDAZOLAM HCL 5 MG/5ML IJ SOLN
INTRAMUSCULAR | Status: DC | PRN
  Administered 2020-12-23: 1 mg via INTRAVENOUS

## 2020-12-23 MED ORDER — KETAMINE HCL 10 MG/ML IJ SOLN
INTRAMUSCULAR | Status: DC | PRN
  Administered 2020-12-23 (×6): 5 mg via INTRAVENOUS

## 2020-12-23 MED ORDER — CHLORHEXIDINE GLUCONATE 4 % EX LIQD: 60.0000 mL | Freq: Once | CUTANEOUS | Status: DC

## 2020-12-23 MED ORDER — HEPARIN 6000 UNIT IRRIGATION SOLUTION
Status: DC | PRN
  Administered 2020-12-23: 1

## 2020-12-23 MED ORDER — LIDOCAINE-EPINEPHRINE 0.5 %-1:200000 IJ SOLN
INTRAMUSCULAR | Status: AC
  Filled 2020-12-23: qty 1

## 2020-12-23 MED ORDER — CEFAZOLIN SODIUM-DEXTROSE 2-4 GM/100ML-% IV SOLN
2.0000 g | INTRAVENOUS | Status: AC
  Administered 2020-12-23: 2 g via INTRAVENOUS
  Filled 2020-12-23: qty 100

## 2020-12-23 MED ORDER — KETAMINE HCL 50 MG/5ML IJ SOSY
PREFILLED_SYRINGE | INTRAMUSCULAR | Status: AC
  Filled 2020-12-23: qty 5

## 2020-12-23 MED ORDER — ORAL CARE MOUTH RINSE: 15.0000 mL | Freq: Once | OROMUCOSAL | Status: AC

## 2020-12-23 MED ORDER — DIPHENHYDRAMINE HCL 50 MG/ML IJ SOLN
INTRAMUSCULAR | Status: AC
  Filled 2020-12-23: qty 1

## 2020-12-23 MED ORDER — HEPARIN SODIUM (PORCINE) 1000 UNIT/ML IJ SOLN
INTRAMUSCULAR | Status: AC
  Filled 2020-12-23: qty 10

## 2020-12-23 MED ORDER — 0.9 % SODIUM CHLORIDE (POUR BTL) OPTIME
TOPICAL | Status: DC | PRN
  Administered 2020-12-23: 1000 mL

## 2020-12-23 MED ORDER — PROPOFOL 500 MG/50ML IV EMUL
INTRAVENOUS | Status: DC | PRN
  Administered 2020-12-23: 50 ug/kg/min via INTRAVENOUS

## 2020-12-23 MED ORDER — OXYCODONE-ACETAMINOPHEN 5-325 MG PO TABS: 1.0000 | ORAL_TABLET | Freq: Four times a day (QID) | ORAL | 0 refills | Status: DC | PRN

## 2020-12-23 MED ORDER — FENTANYL CITRATE PF 50 MCG/ML IJ SOSY: 25.0000 ug | PREFILLED_SYRINGE | INTRAMUSCULAR | Status: DC | PRN

## 2020-12-23 MED ORDER — MIDAZOLAM HCL 2 MG/2ML IJ SOLN
INTRAMUSCULAR | Status: AC
  Filled 2020-12-23: qty 2

## 2020-12-23 SURGICAL SUPPLY — 55 items
ARMBAND PINK RESTRICT EXTREMIT (MISCELLANEOUS) ×3 IMPLANT
BAG DECANTER FOR FLEXI CONT (MISCELLANEOUS) ×3 IMPLANT
BAG HAMPER (MISCELLANEOUS) ×3 IMPLANT
BIOPATCH RED 1 DISK 7.0 (GAUZE/BANDAGES/DRESSINGS) ×3 IMPLANT
CANNULA VESSEL 3MM 2 BLNT TIP (CANNULA) ×3 IMPLANT
CATH PALINDROME-P 23CM W/VT (CATHETERS) ×3 IMPLANT
CLIP LIGATING EXTRA MED SLVR (CLIP) ×3 IMPLANT
CLIP LIGATING EXTRA SM BLUE (MISCELLANEOUS) ×3 IMPLANT
COVER LIGHT HANDLE STERIS (MISCELLANEOUS) ×6 IMPLANT
COVER MAYO STAND XLG (MISCELLANEOUS) ×3 IMPLANT
COVER PROBE U/S 5X48 (MISCELLANEOUS) ×3 IMPLANT
COVER SURGICAL LIGHT HANDLE (MISCELLANEOUS) ×3 IMPLANT
DERMABOND ADVANCED (GAUZE/BANDAGES/DRESSINGS) ×2
DERMABOND ADVANCED .7 DNX12 (GAUZE/BANDAGES/DRESSINGS) ×4 IMPLANT
DRAPE C-ARM FOLDED MOBILE STRL (DRAPES) ×3 IMPLANT
DRAPE CHEST BREAST 15X10 FENES (DRAPES) ×3 IMPLANT
ELECT REM PT RETURN 9FT ADLT (ELECTROSURGICAL) ×3
ELECTRODE REM PT RTRN 9FT ADLT (ELECTROSURGICAL) ×2 IMPLANT
GAUZE 4X4 16PLY ~~LOC~~+RFID DBL (SPONGE) ×3 IMPLANT
GAUZE SPONGE 4X4 12PLY STRL (GAUZE/BANDAGES/DRESSINGS) ×6 IMPLANT
GEL ULTRASOUND 20GR AQUASONIC (MISCELLANEOUS) ×3 IMPLANT
GLOVE SS BIOGEL STRL SZ 7.5 (GLOVE) ×2 IMPLANT
GLOVE SUPERSENSE BIOGEL SZ 7.5 (GLOVE) ×1
GLOVE SURG UNDER POLY LF SZ7 (GLOVE) ×9 IMPLANT
GOWN STRL REUS W/TWL LRG LVL3 (GOWN DISPOSABLE) ×12 IMPLANT
IV NS 500ML (IV SOLUTION) ×1
IV NS 500ML BAXH (IV SOLUTION) ×2 IMPLANT
KIT BLADEGUARD II DBL (SET/KITS/TRAYS/PACK) ×3 IMPLANT
KIT TURNOVER KIT A (KITS) ×3 IMPLANT
MANIFOLD NEPTUNE II (INSTRUMENTS) ×3 IMPLANT
MARKER SKIN DUAL TIP RULER LAB (MISCELLANEOUS) ×6 IMPLANT
NEEDLE 18GX1X1/2 (RX/OR ONLY) (NEEDLE) ×3 IMPLANT
NEEDLE 22X1 1/2 (OR ONLY) (NEEDLE) IMPLANT
NEEDLE HYPO 18GX1.5 BLUNT FILL (NEEDLE) ×3 IMPLANT
NEEDLE HYPO 25GX1X1/2 BEV (NEEDLE) ×3 IMPLANT
NS IRRIG 1000ML POUR BTL (IV SOLUTION) ×3 IMPLANT
PACK CV ACCESS (CUSTOM PROCEDURE TRAY) ×3 IMPLANT
PACK SURGICAL SETUP 50X90 (CUSTOM PROCEDURE TRAY) ×3 IMPLANT
PAD ARMBOARD 7.5X6 YLW CONV (MISCELLANEOUS) ×6 IMPLANT
SET BASIN LINEN APH (SET/KITS/TRAYS/PACK) ×3 IMPLANT
SOAP 2 % CHG 4 OZ (WOUND CARE) ×3 IMPLANT
SOL PREP POV-IOD 4OZ 10% (MISCELLANEOUS) ×3 IMPLANT
SOL PREP PROV IODINE SCRUB 4OZ (MISCELLANEOUS) ×3 IMPLANT
SPONGE T-LAP 18X18 ~~LOC~~+RFID (SPONGE) ×3 IMPLANT
SUT ETHILON 3 0 PS 1 (SUTURE) ×3 IMPLANT
SUT PROLENE 6 0 CC (SUTURE) ×3 IMPLANT
SUT VIC AB 3-0 SH 27 (SUTURE) ×1
SUT VIC AB 3-0 SH 27X BRD (SUTURE) ×2 IMPLANT
SUT VICRYL 4-0 PS2 18IN ABS (SUTURE) ×3 IMPLANT
SYR 10ML LL (SYRINGE) ×3 IMPLANT
SYR 50ML LL SCALE MARK (SYRINGE) ×3 IMPLANT
SYR 5ML LL (SYRINGE) ×6 IMPLANT
SYR CONTROL 10ML LL (SYRINGE) ×3 IMPLANT
TOWEL GREEN STERILE (TOWEL DISPOSABLE) ×3 IMPLANT
UNDERPAD 30X36 HEAVY ABSORB (UNDERPADS AND DIAPERS) ×3 IMPLANT

## 2020-12-23 NOTE — Anesthesia Postprocedure Evaluation (Signed)
Anesthesia Post Note  Patient: Damarea Merkel  Procedure(s) Performed: LEFT ARM ARTERIOVENOUS (AV) FISTULA CREATION (Left: Arm Lower) INSERTION OF PALINDROME DIALYSIS CATHETER (Right: Chest)  Patient location during evaluation: Phase II Anesthesia Type: General Level of consciousness: awake and alert and oriented Pain management: pain level controlled Vital Signs Assessment: post-procedure vital signs reviewed and stable Respiratory status: spontaneous breathing and respiratory function stable Cardiovascular status: blood pressure returned to baseline and stable Postop Assessment: no apparent nausea or vomiting Anesthetic complications: no   No notable events documented.   Last Vitals:  Vitals:   12/23/20 1215 12/23/20 1232  BP: (!) 174/89 (!) 169/93  Pulse: 87 97  Resp: 20 16  Temp: 36.7 C 36.7 C  SpO2: 100% 100%    Last Pain:  Vitals:   12/23/20 1232  TempSrc: Oral  PainSc: 0-No pain                 Shallon Yaklin C Uday Jantz

## 2020-12-23 NOTE — Interval H&P Note (Signed)
History and Physical Interval Note:  12/23/2020 8:55 AM  [name] [name]  has presented today for surgery, with the diagnosis of ESRD.  The various methods of treatment have been discussed with the patient and family. After consideration of risks, benefits and other options for treatment, the patient has consented to  Procedure(s): LEFT ARTERIOVENOUS (AV) FISTULA CREATION VS GRAFT (Left) INSERTION OF DIALYSIS CATHETER (N/A) as a surgical intervention.  The patient's history has been reviewed, patient examined, no change in status, stable for surgery.  I have reviewed the patient's chart and labs.  Questions were answered to the patient's satisfaction.     Curt Jews

## 2020-12-23 NOTE — Anesthesia Preprocedure Evaluation (Addendum)
Anesthesia Evaluation  Patient identified by MRN, date of birth, ID band Patient awake    Reviewed: Allergy & Precautions, NPO status , Patient's Chart, lab work & pertinent test results  History of Anesthesia Complications Negative for: history of anesthetic complications  Airway Mallampati: II  TM Distance: >3 FB Neck ROM: Full    Dental  (+) Dental Advisory Given, Missing, Loose,    Pulmonary shortness of breath, with exertion and lying, asthma , PE   Pulmonary exam normal breath sounds clear to auscultation       Cardiovascular Exercise Tolerance: Poor hypertension, Pt. on medications +CHF and + DOE  + dysrhythmias (PVCs, PACs)  Rhythm:Irregular Rate:Normal  Left ventricle: The cavity size was normal. There was mild concentric hypertrophy. Systolic function was normal. The estimated ejection fraction was in the range of 55% to 60%. Wall motion was normal; there were no regional wall motion abnormalities. Doppler parameters are consistent with abnormal  left ventricular relaxation (grade 1 diastolic dysfunction).  Doppler parameters are consistent with indeterminate ventricular filling pressure.  - Aortic valve: Mildly calcified annulus. Trileaflet.  - Aorta: Aortic root dimension: 38 mm (ED).  - Mitral valve: Mildly calcified annulus. Mildly thickened leaflets  . There was mild regurgitation.   Occasional PVCs and PACs   Neuro/Psych negative neurological ROS  negative psych ROS   GI/Hepatic negative GI ROS, Neg liver ROS,   Endo/Other  diabetes, Well Controlled, Type 2, Insulin Dependent  Renal/GU ESRFRenal disease     Musculoskeletal negative musculoskeletal ROS (+)   Abdominal   Peds  Hematology negative hematology ROS (+)   Anesthesia Other Findings   Reproductive/Obstetrics negative OB ROS                           Anesthesia Physical Anesthesia Plan  ASA:  4  Anesthesia Plan: MAC   Post-op Pain Management:    Induction: Intravenous  PONV Risk Score and Plan:   Airway Management Planned: Nasal Cannula, Natural Airway and Simple Face Mask  Additional Equipment:   Intra-op Plan:   Post-operative Plan:   Informed Consent: I have reviewed the patients History and Physical, chart, labs and discussed the procedure including the risks, benefits and alternatives for the proposed anesthesia with the patient or authorized representative who has indicated his/her understanding and acceptance.     Dental advisory given  Plan Discussed with: CRNA and Surgeon  Anesthesia Plan Comments: (Possible GA with airway was discussed.)       Anesthesia Quick Evaluation

## 2020-12-23 NOTE — Progress Notes (Signed)
The following note was received from Dr. Chryl Heck:  Pt's creatinine is 9.2, he needs to talk to his kidney doctor ASAP to have dialysis arranged. He told me he is getting dialysis cath.   I called and spoke with the patient's wife who states that he is currently getting dialysis access placed and she believes he is scheduled for follow-up with his nephrologist this week.

## 2020-12-23 NOTE — Transfer of Care (Signed)
Immediate Anesthesia Transfer of Care Note  Patient: [name] [name]  Procedure(s) Performed: LEFT ARM ARTERIOVENOUS (AV) FISTULA CREATION (Left: Arm Lower) INSERTION OF PALINDROME DIALYSIS CATHETER (Right: Chest)  Patient Location: PACU  Anesthesia Type:MAC  Level of Consciousness: awake, alert  and oriented  Airway & Oxygen Therapy: Patient Spontanous Breathing  Post-op Assessment: Report given to RN and Post -op Vital signs reviewed and stable  Post vital signs: Reviewed and stable  Last Vitals:  Vitals Value Taken Time  BP 177/101 12/23/20 1106  Temp    Pulse    Resp 18 12/23/20 1107  SpO2    Vitals shown include unvalidated device data.  Last Pain:  Vitals:   12/23/20 0819  TempSrc: Oral  PainSc: 0-No pain      Patients Stated Pain Goal: 8 (66/59/93 5701)  Complications: No notable events documented.

## 2020-12-23 NOTE — Op Note (Signed)
    OPERATIVE REPORT  DATE OF SURGERY: 12/23/2020  PATIENT: [name] [name], 70 y.o. male MRN: [id number]  DOB: [birth year]  PRE-OPERATIVE DIAGNOSIS: ESRD  POST-OPERATIVE DIAGNOSIS:  Same  PROCEDURE: #1 left brachiobasilic fistula, #2 right IJ tunneled hemodialysis catheter  SURGEON:  Curt Jews, M.D.  PHYSICIAN ASSISTANT: Nurse  The assistant was needed for exposure and to expedite the case  ANESTHESIA: Local with sedation  EBL: per anesthesia record  Total I/O In: 600 [I.V.:500; IV Piggyback:100] Out: 5 [Blood:5]  BLOOD ADMINISTERED: none  DRAINS: none  SPECIMEN: none  COUNTS CORRECT:  YES  PATIENT DISPOSITION:  PACU - hemodynamically stable  PROCEDURE DETAILS: The patient was taken everyplace supine position where the area of the left arm was imaged with SonoSite ultrasound.  Patient had a very small cephalic vein throughout its course but did have a large caliber basilic vein.  Decision was made to proceed with for stage brachiobasilic fistula.  The left arm prepped draped in sterile fashion.  Using local anesthesia incision was made between the level of the brachial artery and the basilic vein.  The vein was of good caliber.  Tributary branches were ligated and divided and the vein was ligated distally and divided.  The vein was brought into approximation with the brachial artery.  The patient had a large caliber brachial artery.  The artery was occluded proximally distally was opened 11 blade and sent longstanding Pott scissors.  The vein was spatulated and sewn end-to-side to the artery with a running 6-0 Prolene suture.  Clamps removed and excellent thrill was noted.  The patient maintained a 2+ radial pulse.  The wounds were irrigated with saline.  Hemostased electrocautery.  The wound was closed with 3-0 Vicryl in the subcutaneous and subcuticular tissue.  Dermabond was applied.  Attention was then turned to the right and left neck and chest were prepped and draped in  usual sterile fashion.  Using SonoSite visualization the right internal jugular vein was accessed at the base of the neck.  A guidewire was passed centrally and this was confirmed with fluoroscopy.  Dilator and peel-away sheath was passed over the guidewire and the dilator and guidewire were removed.  A 23 cm catheter was brought through a subcutaneous tunnel with an incision just below the level of the clavicle.  The catheter was passed down the peel-away sheath which was removed.  The catheter tips was positioned the level of the distal right atrium.  The catheter was secured to the skin with a 3-0 nylon stitch and the entry site was closed with a 4-0 subcuticular Vicryl stitch.  The catheter was locked with 1000/cc heparin.  The patient was transferred to the recovery room with chest x-ray pending   Rosetta Posner, M.D., [facility] 12/23/2020 11:17 AM  Note: Portions of this report may have been transcribed using voice recognition software.  Every effort has been made to ensure accuracy; however, inadvertent computerized transcription errors may still be present.

## 2020-12-23 NOTE — Discharge Instructions (Signed)
[facility]  Discharge Instructions  AV Fistula or Graft Surgery for Dialysis Access  Please refer to the following instructions for your post-procedure care. Your surgeon or physician assistant will discuss any changes with you.  Activity  You may drive the day following your surgery, if you are comfortable and no longer taking prescription pain medication. Resume full activity as the soreness in your incision resolves.  Bathing/Showering  You may shower after you go home. Keep your incision dry for 48 hours. Do not soak in a bathtub, hot tub, or swim until the incision heals completely. You may not shower if you have a hemodialysis catheter.  Incision Care  Clean your incision with mild soap and water after 48 hours. Pat the area dry with a clean towel. You do not need a bandage unless otherwise instructed. Do not apply any ointments or creams to your incision. You may have skin glue on your incision. Do not peel it off. It will come off on its own in about one week. Your arm may swell a bit after surgery. To reduce swelling use pillows to elevate your arm so it is above your heart. Your doctor will tell you if you need to lightly wrap your arm with an ACE bandage.  Diet  Resume your normal diet. There are not special food restrictions following this procedure. In order to heal from your surgery, it is CRITICAL to get adequate nutrition. Your body requires vitamins, minerals, and protein. Vegetables are the best source of vitamins and minerals. Vegetables also provide the perfect balance of protein. Processed food has little nutritional value, so try to avoid this.  Medications  Resume taking all of your medications. If your incision is causing pain, you may take over-the counter pain relievers such as acetaminophen (Tylenol). If you were prescribed a stronger pain medication, please be aware these medications can cause nausea and constipation. Prevent  nausea by taking the medication with a snack or meal. Avoid constipation by drinking plenty of fluids and eating foods with high amount of fiber, such as fruits, vegetables, and grains.  Do not take Tylenol if you are taking prescription pain medications.  Follow up Your surgeon may want to see you in the office following your access surgery. If so, this will be arranged at the time of your surgery.  Please call us immediately for any of the following conditions:  Increased pain, redness, drainage (pus) from your incision site Fever of 101 degrees or higher Severe or worsening pain at your incision site Hand pain or numbness.  Reduce your risk of vascular disease:  Stop smoking. If you would like help, call [facility] at 1-800-QUIT-NOW 260-476-5520) or [place] at [place] your cholesterol Maintain a desired weight Control your diabetes Keep your blood pressure down  Dialysis  It will take several weeks to several months for your new dialysis access to be ready for use. Your surgeon will determine when it is okay to use it. Your nephrologist will continue to direct your dialysis. You can continue to use your Permcath until your new access is ready for use.   12/23/2020 [name] [name] [id number] 1950-04-22  Surgeon(s): Taisa Deloria, Arvilla Meres, MD  Procedure(s): LEFT ARM ARTERIOVENOUS (AV) FISTULA CREATION INSERTION OF PALINDROME DIALYSIS CATHETER   May stick graft immediately   May stick graft on designated area only:    Do not stick fistula for 12 weeks    If you have any questions, please call  the office at [phone].

## 2020-12-24 ENCOUNTER — Encounter (HOSPITAL_COMMUNITY): Payer: Self-pay | Admitting: Vascular Surgery

## 2020-12-24 LAB — PROTEIN ELECTROPHORESIS, SERUM
A/G Ratio: 1.4 (ref 0.7–1.7)
Albumin ELP: 3.4 g/dL (ref 2.9–4.4)
Alpha-1-Globulin: 0.2 g/dL (ref 0.0–0.4)
Alpha-2-Globulin: 0.6 g/dL (ref 0.4–1.0)
Beta Globulin: 0.9 g/dL (ref 0.7–1.3)
Gamma Globulin: 0.8 g/dL (ref 0.4–1.8)
Globulin, Total: 2.5 g/dL (ref 2.2–3.9)
Total Protein ELP: 5.9 g/dL — ABNORMAL LOW (ref 6.0–8.5)

## 2020-12-25 ENCOUNTER — Other Ambulatory Visit (HOSPITAL_COMMUNITY): Payer: Self-pay

## 2020-12-25 ENCOUNTER — Encounter (HOSPITAL_COMMUNITY): Payer: Self-pay | Admitting: Vascular Surgery

## 2020-12-26 ENCOUNTER — Other Ambulatory Visit (HOSPITAL_COMMUNITY): Payer: Self-pay

## 2020-12-26 DIAGNOSIS — D509 Iron deficiency anemia, unspecified: Secondary | ICD-10-CM | POA: Diagnosis not present

## 2020-12-26 DIAGNOSIS — D631 Anemia in chronic kidney disease: Secondary | ICD-10-CM | POA: Diagnosis not present

## 2020-12-26 DIAGNOSIS — D8989 Other specified disorders involving the immune mechanism, not elsewhere classified: Secondary | ICD-10-CM

## 2020-12-26 DIAGNOSIS — D472 Monoclonal gammopathy: Secondary | ICD-10-CM

## 2020-12-26 DIAGNOSIS — Z992 Dependence on renal dialysis: Secondary | ICD-10-CM | POA: Diagnosis not present

## 2020-12-26 DIAGNOSIS — Z23 Encounter for immunization: Secondary | ICD-10-CM | POA: Diagnosis not present

## 2020-12-26 DIAGNOSIS — N186 End stage renal disease: Secondary | ICD-10-CM | POA: Diagnosis not present

## 2020-12-29 ENCOUNTER — Other Ambulatory Visit (HOSPITAL_COMMUNITY): Payer: Self-pay | Admitting: Physician Assistant

## 2020-12-30 ENCOUNTER — Other Ambulatory Visit: Payer: Self-pay

## 2020-12-30 ENCOUNTER — Ambulatory Visit (HOSPITAL_COMMUNITY)
Admit: 2020-12-30 | Discharge: 2020-12-30 | Disposition: A | Discharge status: Home / Self Care | Payer: PPO | Attending: Hematology and Oncology | Admitting: Hematology and Oncology

## 2020-12-30 ENCOUNTER — Encounter (HOSPITAL_COMMUNITY): Payer: Self-pay

## 2020-12-30 ENCOUNTER — Ambulatory Visit (HOSPITAL_COMMUNITY)
Admission: RE | Admit: 2020-12-30 | Discharge: 2020-12-30 | Disposition: A | Discharge status: Home / Self Care | Payer: PPO | Source: Ambulatory Visit | Attending: Hematology and Oncology | Admitting: Hematology and Oncology

## 2020-12-30 DIAGNOSIS — E1122 Type 2 diabetes mellitus with diabetic chronic kidney disease: Secondary | ICD-10-CM | POA: Diagnosis not present

## 2020-12-30 DIAGNOSIS — E859 Amyloidosis, unspecified: Secondary | ICD-10-CM | POA: Diagnosis not present

## 2020-12-30 DIAGNOSIS — D472 Monoclonal gammopathy: Secondary | ICD-10-CM

## 2020-12-30 DIAGNOSIS — J45909 Unspecified asthma, uncomplicated: Secondary | ICD-10-CM | POA: Diagnosis not present

## 2020-12-30 DIAGNOSIS — D72822 Plasmacytosis: Secondary | ICD-10-CM | POA: Diagnosis not present

## 2020-12-30 DIAGNOSIS — D8989 Other specified disorders involving the immune mechanism, not elsewhere classified: Secondary | ICD-10-CM

## 2020-12-30 DIAGNOSIS — Z992 Dependence on renal dialysis: Secondary | ICD-10-CM | POA: Diagnosis not present

## 2020-12-30 DIAGNOSIS — N186 End stage renal disease: Secondary | ICD-10-CM | POA: Insufficient documentation

## 2020-12-30 DIAGNOSIS — Z794 Long term (current) use of insulin: Secondary | ICD-10-CM | POA: Insufficient documentation

## 2020-12-30 DIAGNOSIS — D7589 Other specified diseases of blood and blood-forming organs: Secondary | ICD-10-CM | POA: Insufficient documentation

## 2020-12-30 DIAGNOSIS — I132 Hypertensive heart and chronic kidney disease with heart failure and with stage 5 chronic kidney disease, or end stage renal disease: Secondary | ICD-10-CM | POA: Diagnosis not present

## 2020-12-30 DIAGNOSIS — I509 Heart failure, unspecified: Secondary | ICD-10-CM | POA: Insufficient documentation

## 2020-12-30 DIAGNOSIS — D649 Anemia, unspecified: Secondary | ICD-10-CM | POA: Insufficient documentation

## 2020-12-30 HISTORY — DX: Depression, unspecified: F32.A

## 2020-12-30 HISTORY — DX: Dyspnea, unspecified: R06.00

## 2020-12-30 HISTORY — DX: Chronic kidney disease, stage 4 (severe): N18.4

## 2020-12-30 LAB — CBC WITH DIFFERENTIAL/PLATELET
Abs Immature Granulocytes: 0.04 10*3/uL (ref 0.00–0.07)
Basophils Absolute: 0.1 10*3/uL (ref 0.0–0.1)
Basophils Relative: 1 %
Eosinophils Absolute: 0.3 10*3/uL (ref 0.0–0.5)
Eosinophils Relative: 3 %
HCT: 27.9 % — ABNORMAL LOW (ref 39.0–52.0)
Hemoglobin: 8.6 g/dL — ABNORMAL LOW (ref 13.0–17.0)
Immature Granulocytes: 1 %
Lymphocytes Relative: 18 %
Lymphs Abs: 1.4 10*3/uL (ref 0.7–4.0)
MCH: 29.6 pg (ref 26.0–34.0)
MCHC: 30.8 g/dL (ref 30.0–36.0)
MCV: 95.9 fL (ref 80.0–100.0)
Monocytes Absolute: 0.9 10*3/uL (ref 0.1–1.0)
Monocytes Relative: 11 %
Neutro Abs: 5.2 10*3/uL (ref 1.7–7.7)
Neutrophils Relative %: 66 %
Platelets: 165 10*3/uL (ref 150–400)
RBC: 2.91 MIL/uL — ABNORMAL LOW (ref 4.22–5.81)
RDW: 14 % (ref 11.5–15.5)
WBC: 7.8 10*3/uL (ref 4.0–10.5)
nRBC: 0 % (ref 0.0–0.2)

## 2020-12-30 LAB — GLUCOSE, CAPILLARY: Glucose-Capillary: 110 mg/dL — ABNORMAL HIGH (ref 70–99)

## 2020-12-30 MED ORDER — FENTANYL CITRATE (PF) 100 MCG/2ML IJ SOLN
INTRAMUSCULAR | Status: AC
  Filled 2020-12-30: qty 2

## 2020-12-30 MED ORDER — FENTANYL CITRATE (PF) 100 MCG/2ML IJ SOLN
INTRAMUSCULAR | Status: DC | PRN
  Administered 2020-12-30: 50 ug via INTRAVENOUS

## 2020-12-30 MED ORDER — MIDAZOLAM HCL 2 MG/2ML IJ SOLN
INTRAMUSCULAR | Status: AC
  Filled 2020-12-30: qty 4

## 2020-12-30 MED ORDER — SODIUM CHLORIDE 0.9 % IV SOLN: INTRAVENOUS | Status: DC

## 2020-12-30 MED ORDER — MIDAZOLAM HCL 2 MG/2ML IJ SOLN
INTRAMUSCULAR | Status: DC | PRN
  Administered 2020-12-30: 1 mg via INTRAVENOUS

## 2020-12-30 NOTE — Consult Note (Signed)
Chief Complaint: Patient was seen in consultation today for CT-guided bone marrow biopsy  Referring Physician(s): Benay Pike  Supervising Physician: Michaelle Birks  Patient Status: [place]  History of Present Illness: [name] [name] is a 70 y.o. male with past medical history significant for diabetes, hypertension, end-stage renal disease, asthma, depression, proteinuria, lower extremity edema and MGUS.  Due to concern for possible amyloidosis he presents today for CT-guided bone marrow biopsy for further evaluation.  Past Medical History:  Diagnosis Date   Asthma    CHF (congestive heart failure) ([place])    Depression    Diabetes mellitus without complication ([place])    Dyspnea    Stage 4 chronic kidney disease ([place])     Past Surgical History:  Procedure Laterality Date   ABCESS DRAINAGE     on back - possible spider bite   AV FISTULA PLACEMENT Left 12/23/2020   Procedure: LEFT ARM ARTERIOVENOUS (AV) FISTULA CREATION;  Surgeon: Rosetta Posner, MD;  Location: [facility];  Service: Vascular;  Laterality: Left;   COLONOSCOPY     INSERTION OF DIALYSIS CATHETER Right 12/23/2020   Procedure: INSERTION OF PALINDROME DIALYSIS CATHETER;  Surgeon: Rosetta Posner, MD;  Location: [facility];  Service: Vascular;  Laterality: Right;    Allergies: Patient has no known allergies.  Medications: Prior to Admission medications   Medication Sig Start Date End Date Taking? Authorizing Provider  albuterol (VENTOLIN HFA) 108 (90 Base) MCG/ACT inhaler INHALE 2 PUFFS BY MOUTH EVERY 4 HOURS AS NEEDED ONLY  IF  YOU  CANT  CATCH  YOUR  BREATH 08/07/19  Yes Tanda Rockers, MD  allopurinol (ZYLOPRIM) 300 MG tablet Take 300 mg by mouth daily.    [provider]  amLODipine (NORVASC) 5 MG tablet Take 5 mg by mouth daily. 04/08/20   [provider]  calcitRIOL (ROCALTROL) 0.25 MCG capsule Take 0.25 mcg by mouth daily. 11/27/20   [provider]  fluticasone furoate-vilanterol (BREO  ELLIPTA) 100-25 MCG/INH AEPB Inhale 1 puff into the lungs daily.    [provider]  furosemide (LASIX) 80 MG tablet Take 80 mg by mouth 2 (two) times daily. 12/09/20   [provider]  hydrALAZINE (APRESOLINE) 50 MG tablet Take 50 mg by mouth 3 (three) times daily.    [provider]  insulin aspart (NOVOLOG) 100 UNIT/ML injection Inject 5 Units into the skin 3 (three) times daily as needed for high blood sugar.    [provider]  insulin detemir (LEVEMIR) 100 UNIT/ML injection Inject 10 Units into the skin in the morning.    [provider]  oxyCODONE-acetaminophen (PERCOCET) 5-325 MG tablet Take 1 tablet by mouth every 6 (six) hours as needed for severe pain. 12/23/20   Rosetta Posner, MD  sodium bicarbonate 650 MG tablet Take 650 mg by mouth 3 (three) times daily.    [provider]     Family History  Problem Relation Age of Onset   Breast cancer Mother        died 56   Emphysema Father        died 73   Diabetes Sister    Diabetes Brother     Social History   Socioeconomic History   Marital status: Married    Spouse name: Not on file   Number of children: Not on file   Years of education: Not on file   Highest education level: Not on file  Occupational History   Not on file  Tobacco Use   Smoking status: Never   Smokeless tobacco: Never  Vaping Use   Vaping Use: Never used  Substance and Sexual Activity   Alcohol use: Not Currently   Drug use: Never   Sexual activity: Not Currently  Other Topics Concern   Not on file  Social History Narrative   Not on file   Social Determinants of Health   Financial Resource Strain: Low Risk    Difficulty of Paying Living Expenses: Not hard at all  Food Insecurity: No Food Insecurity   Worried About Charity fundraiser in the Last Year: Never true   [place] in the Last Year: Never true  Transportation Needs: No Transportation Needs   Lack of Transportation  (Medical): No   Lack of Transportation (Non-Medical): No  Physical Activity: Inactive   Days of Exercise per Week: 0 days   Minutes of Exercise per Session: 0 min  Stress: No Stress Concern Present   Feeling of Stress : Not at all  Social Connections: Moderately Isolated   Frequency of Communication with Friends and Family: More than three times a week   Frequency of Social Gatherings with Friends and Family: More than three times a week   Attends Religious Services: Never   Marine scientist or Organizations: No   Attends Archivist Meetings: Never   Marital Status: Married      Review of Systems denies fever, headache, chest pain, cough, abdominal/back pain, nausea, vomiting or bleeding.  He does have dyspnea with exertion.  Vital Signs: BP (!) 160/83   Pulse 86   Temp 98.6 F (37 C) (Oral)   Resp 20   SpO2 98%   Physical Exam awake, alert.  Chest clear to auscultation bilaterally.  Tunneled right IJ HD catheter in place.  Heart with regular rate and rhythm.  Abdomen soft, positive bowel sounds, nontender.  Bilateral lower extremity edema noted.  Left upper arm fistula with positive bruit/thrill  Imaging: DG Chest Port 1 View  Result Date: 12/23/2020 EXAM: PORTABLE CHEST 1 VIEW COMPARISON:  11/23/2020. FINDINGS: Interval placement of a right IJ dual lumen central venous catheter with the tip projecting at the superior cavoatrial junction. No visible pneumothorax or pleural effusions. Previously seen masslike left upper lobe consolidation (see CT chest 11/14/2020) is not well characterized by radiographs. Otherwise, no consolidation. Similar cardiomediastinal silhouette. IMPRESSION: 1. Right IJ central venous catheter with the tip projecting at the superior cavoatrial junction. No visible pneumothorax. 2. Previously seen masslike left upper lobe consolidation (see CT chest 11/14/2020) is not well characterized by radiograph. Electronically Signed   By: Margaretha Sheffield M.D.   On: 12/23/2020 11:55   DG C-Arm 1-60 Min-No Report  Result Date: 12/23/2020 Fluoroscopy was utilized by the requesting physician.  No radiographic interpretation.   VAS [place] UPPER EXTREMITY ARTERIAL DUPLEX  Result Date: 12/10/2020  UPPER EXTREMITY DUPLEX STUDY Patient Name:  [name] [name]  Date of Exam:   12/10/2020 Medical Rec #: [id number]       Accession #:    [id number] Date of Birth: 1950/11/19      Patient Gender: M Patient Age:   35 years Exam [facility] Procedure:      VAS [place] UPPER EXTREMITY ARTERIAL DUPLEX Referring Phys: TODD EARLY --------------------------------------------------------------------------------  Indications: Pre op HD access.  Performing Technologist: Ralene Cork RVT  Examination Guidelines: A complete evaluation includes B-mode imaging, spectral Doppler, color Doppler, and power Doppler as  needed of all accessible portions of each vessel. Bilateral testing is considered an integral part of a complete examination. Limited examinations for reoccurring indications may be performed as noted.  Right Pre-Dialysis Findings: +-----------------------+----------+--------------------+---------+--------+ Location               PSV (cm/s)Intralum. Diam. (cm)Waveform Comments +-----------------------+----------+--------------------+---------+--------+ Brachial Antecub. fossa103       0.53                triphasic         +-----------------------+----------+--------------------+---------+--------+ Radial Art at Wrist    103       0.26                triphasic         +-----------------------+----------+--------------------+---------+--------+ Ulnar Art at Wrist     73        0.14                triphasic         +-----------------------+----------+--------------------+---------+--------+  Left Pre-Dialysis Findings: +----------------------+----------+------------------+---------+---------------+ Location              PSV  (cm/s)Intralum. Diam.   Waveform Comments                                        (cm)                                       +----------------------+----------+------------------+---------+---------------+ Brachial Antecub.     120       0.43              triphasic                fossa                                                                      +----------------------+----------+------------------+---------+---------------+ Radial Art at Wrist   97        0.29              triphasicatherosclerotic +----------------------+----------+------------------+---------+---------------+ Ulnar Art at Wrist    96        0.18              triphasic                +----------------------+----------+------------------+---------+---------------+  Summary:  Right: Patent brachial, radial, and ulnar arteries. Left: Patent brachial, radial, and ulnar arteries. The distal radial       artery has circumferential atherosclerosis. *See table(s) above for measurements and observations. Electronically signed by Deitra Mayo MD on 12/10/2020 at 12:19:42 PM.    Final    VAS [place] UPPER EXT VEIN MAPPING (PRE-OP AVF)  Result Date: 12/10/2020 UPPER EXTREMITY VEIN MAPPING Patient Name:  [name] [name]  Date of Exam:   12/10/2020 Medical Rec #: [id number]       Accession #:    [id number] Date of Birth: 06/07/1950      Patient Gender: M Patient Age:   29 years Exam Location[facility] Procedure:      VAS [place]  UPPER EXT VEIN MAPPING (PRE-OP AVF) Referring Phys: TODD EARLY --------------------------------------------------------------------------------  Indications: Pre-access. Performing Technologist: Ralene Cork RVT  Examination Guidelines: A complete evaluation includes B-mode imaging, spectral Doppler, color Doppler, and power Doppler as needed of all accessible portions of each vessel. Bilateral testing is considered an integral part of a complete examination. Limited  examinations for reoccurring indications may be performed as noted. +-----------------+-------------+----------+--------+ Right Cephalic   Diameter (cm)Depth (cm)Findings +-----------------+-------------+----------+--------+ Shoulder             0.46                        +-----------------+-------------+----------+--------+ Prox upper arm       0.37                        +-----------------+-------------+----------+--------+ Mid upper arm        0.37                        +-----------------+-------------+----------+--------+ Dist upper arm       0.29                        +-----------------+-------------+----------+--------+ Antecubital fossa    0.31                        +-----------------+-------------+----------+--------+ Prox forearm         0.27                        +-----------------+-------------+----------+--------+ Mid forearm          0.22                        +-----------------+-------------+----------+--------+ Dist forearm         0.22                        +-----------------+-------------+----------+--------+ +-----------------+-------------+----------+--------+ Right Basilic    Diameter (cm)Depth (cm)Findings +-----------------+-------------+----------+--------+ Dist upper arm       0.59                        +-----------------+-------------+----------+--------+ Antecubital fossa    0.36                        +-----------------+-------------+----------+--------+ +-----------------+-------------+----------+--------+ Left Cephalic    Diameter (cm)Depth (cm)Findings +-----------------+-------------+----------+--------+ Shoulder             0.37                        +-----------------+-------------+----------+--------+ Prox upper arm       0.34                        +-----------------+-------------+----------+--------+ Mid upper arm        0.31                         +-----------------+-------------+----------+--------+ Dist upper arm       0.26                        +-----------------+-------------+----------+--------+ Antecubital fossa    0.32                        +-----------------+-------------+----------+--------+  Prox forearm         0.28                        +-----------------+-------------+----------+--------+ Mid forearm          0.22                        +-----------------+-------------+----------+--------+ Dist forearm         0.21                        +-----------------+-------------+----------+--------+ +-----------------+-------------+----------+--------+ Left Basilic     Diameter (cm)Depth (cm)Findings +-----------------+-------------+----------+--------+ Mid upper arm        0.57                        +-----------------+-------------+----------+--------+ Dist upper arm       0.59                        +-----------------+-------------+----------+--------+ Antecubital fossa    0.35                        +-----------------+-------------+----------+--------+ Summary: Right: Patent cephalic and basilic veins. Multiple branching in the        forearm. Left: Patent cephalic and basilic veins. Multiple branching in the       forearm. *See table(s) above for measurements and observations.  Diagnosing physician: Deitra Mayo MD Electronically signed by Deitra Mayo MD on 12/10/2020 at 12:19:17 PM.    Final     Labs:  CBC: Recent Labs    12/19/20 1031 12/23/20 0843  WBC 6.4  --   HGB 9.3* 9.5*  HCT 29.2* 28.0*  PLT 206  --     COAGS: Recent Labs    12/19/20 1031  INR 1.0  APTT 30    BMP: Recent Labs    12/19/20 1031 12/23/20 0843  NA 144 145  K 4.3 3.8  CL 114* 114*  CO2 19*  --   GLUCOSE 116* 92  BUN 96* 104*  CALCIUM 8.4*  --   CREATININE 9.28* 10.20*  GFRNONAA 6*  --     LIVER FUNCTION TESTS: Recent Labs    12/19/20 1031  BILITOT 0.5  AST 23  ALT 20   ALKPHOS 75  PROT 6.4*  ALBUMIN 3.4*    TUMOR MARKERS: No results for input(s): AFPTM, CEA, CA199, CHROMGRNA in the last 8760 hours.  Assessment and Plan: 70 y.o. male with past medical history significant for diabetes, hypertension, end-stage renal disease, asthma, depression, proteinuria, lower extremity edema and MGUS.  Due to concern for possible amyloidosis he presents today for CT-guided bone marrow biopsy for further evaluation.Risks and benefits of procedure was discussed with the patient including, but not limited to bleeding, infection, damage to adjacent structures or low yield requiring additional tests.  All of the questions were answered and there is agreement to proceed.  Consent signed and in chart.    Thank you for this interesting consult.  I greatly enjoyed meeting Adriell Polansky and look forward to participating in their care.  A copy of this report was sent to the requesting provider on this date.  Electronically Signed: D. Rowe Robert, PA-C 12/30/2020, 8:27 AM   I spent a total of  20 minutes   in face to face in clinical consultation, greater than 50% of which  was counseling/coordinating care for CT-guided bone marrow biopsy

## 2020-12-30 NOTE — Discharge Instructions (Signed)
I

## 2020-12-30 NOTE — Procedures (Signed)
Vascular and Interventional Radiology Procedure Note  Patient: [name] [name] DOB: 1950/05/11 Medical Record Number: [id number] Note Date/Time: 12/30/20 9:44 AM   Performing Physician: Michaelle Birks, MD Assistant(s): None  Diagnosis: Amyloidosis  Procedure: BONE MARROW ASPIRATION AND BIOPSY  Anesthesia: Conscious Sedation Complications: None Estimated Blood Loss: Minimal Specimens: Sent for Pathology  Findings:  Successful CT-guided bone marrow biopsy A total of 1 cores were obtained. Hemostasis of the tract was achieved using Manual Pressure.  Plan: Bed rest for 1 hours.  See detailed procedure note with images in PACS. The patient tolerated the procedure well without incident or complication and was returned to Recovery in stable condition.    Michaelle Birks, MD [facility]   Pager. [place]

## 2020-12-31 ENCOUNTER — Ambulatory Visit (HOSPITAL_COMMUNITY): Payer: PPO | Admitting: Hematology

## 2021-01-02 ENCOUNTER — Ambulatory Visit (HOSPITAL_COMMUNITY): Payer: PPO | Admitting: Hematology and Oncology

## 2021-01-08 ENCOUNTER — Encounter (HOSPITAL_COMMUNITY): Payer: Self-pay | Admitting: Hematology and Oncology

## 2021-01-12 NOTE — Progress Notes (Signed)
[place], [place] [address]   CLINIC:  Medical Oncology/Hematology  PCP:  Neale Burly, MD [place] / [place] [address] [phone]   REASON FOR VISIT:  Follow-up for MGUS  PRIOR THERAPY: none  NGS Results: not done  CURRENT THERAPY: surveillance  INTERVAL HISTORY:  [name], a 70 y.o. 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 Lucero was last seen on 12/19/2020 by Dr. Benay Pike.   Today he reports feeling good. He had been on dialysis starting 12/26/20 on Monday, Wednesday, and Fridays. He reports urinating 5 times daily. He denies tingling/numbness in the hands and feet, and the swelling in his legs has improved. He has a history of DM and high BP. Prior to retirement, he was a Administrator for 30 years. He denies any unusual or excessive chemical exposure. His mother had breast cancer, and he denies any smoking history.   REVIEW OF SYSTEMS:  Review of Systems  Constitutional:  Negative for appetite change and fatigue (70%).  Cardiovascular:  Positive for leg swelling (improved).  Neurological:  Negative for numbness.  All other systems reviewed and are negative.  PAST MEDICAL/SURGICAL HISTORY:  Past Medical History:  Diagnosis Date   Asthma    CHF (congestive heart failure) (HCC)    Depression    Diabetes mellitus without complication (HCC)    Dyspnea    Stage 4 chronic kidney disease (HCC)    Past Surgical History:  Procedure Laterality Date   ABCESS DRAINAGE     on back - possible spider bite   AV FISTULA PLACEMENT Left 12/23/2020   Procedure: LEFT ARM ARTERIOVENOUS (AV) FISTULA CREATION;  Surgeon: Rosetta Posner, MD;  Location: [facility];  Service: Vascular;  Laterality: Left;   COLONOSCOPY     INSERTION OF DIALYSIS CATHETER Right 12/23/2020   Procedure: INSERTION OF PALINDROME DIALYSIS CATHETER;  Surgeon: Rosetta Posner, MD;  Location: [facility];  Service: Vascular;  Laterality: Right;     SOCIAL HISTORY:  Social History   Socioeconomic History   Marital status: Married    Spouse name: Not on file   Number of children: Not on file   Years of education: Not on file   Highest education level: Not on file  Occupational History   Not on file  Tobacco Use   Smoking status: Never   Smokeless tobacco: Never  Vaping Use   Vaping Use: Never used  Substance and Sexual Activity   Alcohol use: Not Currently   Drug use: Never   Sexual activity: Not Currently  Other Topics Concern   Not on file  Social History Narrative   Not on file   Social Determinants of Health   Financial Resource Strain: Low Risk    Difficulty of Paying Living Expenses: Not hard at all  Food Insecurity: No Food Insecurity   Worried About Charity fundraiser in the Last Year: Never true   Ran Out of Food in the Last Year: Never true  Transportation Needs: No Transportation Needs   Lack of Transportation (Medical): No   Lack of Transportation (Non-Medical): No  Physical Activity: Inactive   Days of Exercise per Week: 0 days   Minutes of Exercise per Session: 0 min  Stress: No Stress Concern Present   Feeling of Stress : Not at all  Social Connections: Moderately Isolated   Frequency of Communication with Friends and Family: More than three times a week  Frequency of Social Gatherings with Friends and Family: More than three times a week   Attends Religious Services: Never   Marine scientist or Organizations: No   Attends Music therapist: Never   Marital Status: Married  Human resources officer Violence: Not At Risk   Fear of Current or Ex-Partner: No   Emotionally Abused: No   Physically Abused: No   Sexually Abused: No    FAMILY HISTORY:  Family History  Problem Relation Age of Onset   Breast cancer Mother        died 69   Emphysema Father        died 33   Diabetes Sister    Diabetes Brother     CURRENT MEDICATIONS:  Current Outpatient Medications   Medication Sig Dispense Refill   albuterol (VENTOLIN HFA) 108 (90 Base) MCG/ACT inhaler INHALE 2 PUFFS BY MOUTH EVERY 4 HOURS AS NEEDED ONLY  IF  YOU  CANT  CATCH  YOUR  BREATH 8 g 0   allopurinol (ZYLOPRIM) 300 MG tablet Take 300 mg by mouth daily.     amLODipine (NORVASC) 5 MG tablet Take 5 mg by mouth daily.     calcitRIOL (ROCALTROL) 0.25 MCG capsule Take 0.25 mcg by mouth daily.     fluticasone furoate-vilanterol (BREO ELLIPTA) 100-25 MCG/INH AEPB Inhale 1 puff into the lungs daily.     furosemide (LASIX) 80 MG tablet Take 80 mg by mouth 2 (two) times daily.     hydrALAZINE (APRESOLINE) 50 MG tablet Take 50 mg by mouth 3 (three) times daily.     insulin aspart (NOVOLOG) 100 UNIT/ML injection Inject 5 Units into the skin 3 (three) times daily as needed for high blood sugar.     insulin detemir (LEVEMIR) 100 UNIT/ML injection Inject 10 Units into the skin in the morning.     oxyCODONE-acetaminophen (PERCOCET) 5-325 MG tablet Take 1 tablet by mouth every 6 (six) hours as needed for severe pain. 8 tablet 0   sodium bicarbonate 650 MG tablet Take 650 mg by mouth 3 (three) times daily.     No current facility-administered medications for this visit.    ALLERGIES:  No Known Allergies  PHYSICAL EXAM:  Performance status (ECOG): 1 - Symptomatic but completely ambulatory  There were no vitals filed for this visit. Wt Readings from Last 3 Encounters:  12/23/20 261 lb (118.4 kg)  12/19/20 261 lb (118.4 kg)  12/17/20 263 lb (119.3 kg)   Physical Exam Vitals reviewed.  Constitutional:      Appearance: Normal appearance.  Cardiovascular:     Rate and Rhythm: Normal rate and regular rhythm.     Pulses: Normal pulses.     Heart sounds: Normal heart sounds.  Pulmonary:     Effort: Pulmonary effort is normal.     Breath sounds: Normal breath sounds.  Neurological:     General: No focal deficit present.     Mental Status: He is alert and oriented to person, place, and time.  Psychiatric:         Mood and Affect: Mood normal.        Behavior: Behavior normal.     LABORATORY DATA:  I have reviewed the labs as listed.  CBC Latest Ref Rng & Units 12/30/2020 12/23/2020 12/19/2020  WBC 4.0 - 10.5 K/uL 7.8 - 6.4  Hemoglobin 13.0 - 17.0 g/dL 8.6(L) 9.5(L) 9.3(L)  Hematocrit 39.0 - 52.0 % 27.9(L) 28.0(L) 29.2(L)  Platelets 150 - 400 K/uL 165 -  206   CMP Latest Ref Rng & Units 12/23/2020 12/19/2020 04/24/2018  Glucose 70 - 99 mg/dL 92 116(H) 112(H)  BUN 8 - 23 mg/dL 104(H) 96(H) 60(H)  Creatinine 0.61 - 1.24 mg/dL 10.20(H) 9.28(H) 2.51(H)  Sodium 135 - 145 mmol/L 145 144 141  Potassium 3.5 - 5.1 mmol/L 3.8 4.3 3.7  Chloride 98 - 111 mmol/L 114(H) 114(H) 106  CO2 22 - 32 mmol/L - 19(L) 25  Calcium 8.9 - 10.3 mg/dL - 8.4(L) 9.5  Total Protein 6.5 - 8.1 g/dL - 6.4(L) -  Total Bilirubin 0.3 - 1.2 mg/dL - 0.5 -  Alkaline Phos 38 - 126 U/L - 75 -  AST 15 - 41 U/L - 23 -  ALT 0 - 44 U/L - 20 -    DIAGNOSTIC IMAGING:  I have independently reviewed the scans and discussed with the patient. CT BIOPSY  Result Date: 12/30/2020 INDICATION: Amyloidosis. EXAM: CT GUIDED BONE MARROW ASPIRATION AND CORE BIOPSY MEDICATIONS: None. ANESTHESIA/SEDATION: Moderate (conscious) sedation was employed during this procedure. A total of 2 milligrams versed and 100 micrograms fentanyl were administered intravenously. The patient's level of consciousness and vital signs were monitored continuously by radiology nursing throughout the procedure under my direct supervision. Total monitored sedation time: 12 minutes FLUOROSCOPY TIME:  CT dose was not reported. COMPLICATIONS: None immediate. Estimated blood loss: <5 mL PROCEDURE: Informed written consent was obtained from the patient after a thorough discussion of the procedural risks, benefits and alternatives. All questions were addressed. Maximal Sterile Barrier Technique was utilized including caps, mask, sterile gowns, sterile gloves, sterile drape, hand hygiene and  skin antiseptic. A timeout was performed prior to the initiation of the procedure. The patient was positioned prone and non-contrast localization CT was performed of the pelvis to demonstrate the iliac marrow spaces. Maximal barrier sterile technique utilized including caps, mask, sterile gowns, sterile gloves, large sterile drape, hand hygiene, and chlorhexidine prep. Under sterile conditions and local anesthesia, an 11 gauge coaxial bone biopsy needle was advanced into the RIGHT iliac marrow space. Needle position was confirmed with CT imaging. Initially, bone marrow aspiration was performed. Next, the 11 gauge outer cannula was utilized to obtain a RIGHT iliac bone marrow core biopsy. Needle was removed. Hemostasis was obtained with compression. The patient tolerated the procedure well. Samples were prepared with the cytotechnologist. IMPRESSION: Successful CT-guided bone marrow aspiration and biopsy, as above. Michaelle Birks, MD [facility] Radiology Special[facility] Radiology Electronically Signed   By: Michaelle Birks M.D.   On: 12/30/2020 17:52   DG Chest Port 1 View  Result Date: 12/23/2020 EXAM: PORTABLE CHEST 1 VIEW COMPARISON:  11/23/2020. FINDINGS: Interval placement of a right IJ dual lumen central venous catheter with the tip projecting at the superior cavoatrial junction. No visible pneumothorax or pleural effusions. Previously seen masslike left upper lobe consolidation (see CT chest 11/14/2020) is not well characterized by radiographs. Otherwise, no consolidation. Similar cardiomediastinal silhouette. IMPRESSION: 1. Right IJ central venous catheter with the tip projecting at the superior cavoatrial junction. No visible pneumothorax. 2. Previously seen masslike left upper lobe consolidation (see CT chest 11/14/2020) is not well characterized by radiograph. Electronically Signed   By: Margaretha Sheffield M.D.   On: 12/23/2020 11:55   CT BONE MARROW BIOPSY & ASPIRATION  Result  Date: 12/30/2020 INDICATION: Amyloidosis. EXAM: CT GUIDED BONE MARROW ASPIRATION AND CORE BIOPSY MEDICATIONS: None. ANESTHESIA/SEDATION: Moderate (conscious) sedation was employed during this procedure. A total of 2 milligrams versed and 100 micrograms fentanyl were  administered intravenously. The patient's level of consciousness and vital signs were monitored continuously by radiology nursing throughout the procedure under my direct supervision. Total monitored sedation time: 12 minutes FLUOROSCOPY TIME:  CT dose was not reported. COMPLICATIONS: None immediate. Estimated blood loss: <5 mL PROCEDURE: Informed written consent was obtained from the patient after a thorough discussion of the procedural risks, benefits and alternatives. All questions were addressed. Maximal Sterile Barrier Technique was utilized including caps, mask, sterile gowns, sterile gloves, sterile drape, hand hygiene and skin antiseptic. A timeout was performed prior to the initiation of the procedure. The patient was positioned prone and non-contrast localization CT was performed of the pelvis to demonstrate the iliac marrow spaces. Maximal barrier sterile technique utilized including caps, mask, sterile gowns, sterile gloves, large sterile drape, hand hygiene, and chlorhexidine prep. Under sterile conditions and local anesthesia, an 11 gauge coaxial bone biopsy needle was advanced into the RIGHT iliac marrow space. Needle position was confirmed with CT imaging. Initially, bone marrow aspiration was performed. Next, the 11 gauge outer cannula was utilized to obtain a RIGHT iliac bone marrow core biopsy. Needle was removed. Hemostasis was obtained with compression. The patient tolerated the procedure well. Samples were prepared with the cytotechnologist. IMPRESSION: Successful CT-guided bone marrow aspiration and biopsy, as above. Michaelle Birks, MD Vascular and Interventional Radiology Specialists [facility] Radiology Electronically Signed   By:  Michaelle Birks M.D.   On: 12/30/2020 17:52   DG C-Arm 1-60 Min-No Report  Result Date: 12/23/2020 Fluoroscopy was utilized by the requesting physician.  No radiographic interpretation.     ASSESSMENT:  MGUS: - Patient evaluated at the request of Dr. Theador Hawthorne for nephrotic range proteinuria. - Kappa light chains on 12/19/2020 was 1775, lambda light chains 87, ratio of 20.4.  SPEP negative for M spike. - Bone marrow biopsy on 12/30/2020-normocellular marrow with trilineage hematopoiesis.  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  [place]ongo red staining is pending.  Crystal violet stain for amyloid deposition was negative. - Chromosome analysis 45, X, minus Y[5]/46, XY[15]  2.  Social/family history: - He worked as a Agricultural consultant prior to retirement.  No chemical exposure.  No smoking. - Mother died of breast cancer.   PLAN:  MGUS: - I have reviewed the results of bone marrow biopsy findings in detail. - We discussed pathophysiology and prognosis of MGUS. - He does not have any neuropathy.  No new bone pains. - He was reportedly started on hemodialysis on 12/26/2020.  He is still making urine. - We also discussed surveillance plan in detail. - RTC 4 months with repeat SPEP, serum immunofixation, free light chains, LDH, beta-2 microglobulin. - Also plan to do skeletal survey.  2.  ESRD on HD: - HD started around 12/26/2020.  He is undergoing dialysis on Monday, Wednesday and Friday.   Orders placed this encounter:  No orders of the defined types were placed in this encounter.    Derek Jack, MD [place] [phone]   I, Thana Ates, am acting as a scribe for Dr. Derek Jack.  I, Derek Jack MD, have reviewed the above documentation for accuracy and completeness, and I agree with the above.

## 2021-01-13 ENCOUNTER — Inpatient Hospital Stay (HOSPITAL_BASED_OUTPATIENT_CLINIC_OR_DEPARTMENT_OTHER): Payer: PPO | Admitting: Hematology

## 2021-01-13 ENCOUNTER — Inpatient Hospital Stay (HOSPITAL_COMMUNITY): Payer: PPO

## 2021-01-13 ENCOUNTER — Other Ambulatory Visit: Payer: Self-pay

## 2021-01-13 VITALS — BP 154/74 | HR 90 | Temp 96.8°F | Resp 18 | Wt 236.9 lb

## 2021-01-13 DIAGNOSIS — D472 Monoclonal gammopathy: Secondary | ICD-10-CM

## 2021-01-13 DIAGNOSIS — D8989 Other specified disorders involving the immune mechanism, not elsewhere classified: Secondary | ICD-10-CM

## 2021-01-13 LAB — CBC WITH DIFFERENTIAL/PLATELET
Abs Immature Granulocytes: 0.02 10*3/uL (ref 0.00–0.07)
Basophils Absolute: 0.1 10*3/uL (ref 0.0–0.1)
Basophils Relative: 1 %
Eosinophils Absolute: 0.2 10*3/uL (ref 0.0–0.5)
Eosinophils Relative: 3 %
HCT: 30.8 % — ABNORMAL LOW (ref 39.0–52.0)
Hemoglobin: 9.7 g/dL — ABNORMAL LOW (ref 13.0–17.0)
Immature Granulocytes: 0 %
Lymphocytes Relative: 24 %
Lymphs Abs: 1.6 10*3/uL (ref 0.7–4.0)
MCH: 30.4 pg (ref 26.0–34.0)
MCHC: 31.5 g/dL (ref 30.0–36.0)
MCV: 96.6 fL (ref 80.0–100.0)
Monocytes Absolute: 0.5 10*3/uL (ref 0.1–1.0)
Monocytes Relative: 8 %
Neutro Abs: 4.3 10*3/uL (ref 1.7–7.7)
Neutrophils Relative %: 64 %
Platelets: 245 10*3/uL (ref 150–400)
RBC: 3.19 MIL/uL — ABNORMAL LOW (ref 4.22–5.81)
RDW: 13.1 % (ref 11.5–15.5)
WBC: 6.7 10*3/uL (ref 4.0–10.5)
nRBC: 0 % (ref 0.0–0.2)

## 2021-01-13 LAB — COMPREHENSIVE METABOLIC PANEL
ALT: 15 U/L (ref 0–44)
AST: 21 U/L (ref 15–41)
Albumin: 3.7 g/dL (ref 3.5–5.0)
Alkaline Phosphatase: 88 U/L (ref 38–126)
Anion gap: 13 (ref 5–15)
BUN: 75 mg/dL — ABNORMAL HIGH (ref 8–23)
CO2: 26 mmol/L (ref 22–32)
Calcium: 8.8 mg/dL — ABNORMAL LOW (ref 8.9–10.3)
Chloride: 96 mmol/L — ABNORMAL LOW (ref 98–111)
Creatinine, Ser: 6.86 mg/dL — ABNORMAL HIGH (ref 0.61–1.24)
GFR, Estimated: 8 mL/min — ABNORMAL LOW (ref >60)
Glucose, Bld: 99 mg/dL (ref 70–99)
Potassium: 3.4 mmol/L — ABNORMAL LOW (ref 3.5–5.1)
Sodium: 135 mmol/L (ref 135–145)
Total Bilirubin: 0.5 mg/dL (ref 0.3–1.2)
Total Protein: 7 g/dL (ref 6.5–8.1)

## 2021-01-13 LAB — BRAIN NATRIURETIC PEPTIDE: B Natriuretic Peptide: 262 pg/mL — ABNORMAL HIGH (ref 0.0–100.0)

## 2021-01-13 NOTE — Patient Instructions (Addendum)
[place] at [facility] Discharge Instructions  You were seen today by Dr. Delton Coombes. He went over your recent results and scans. You will be scheduled for a skeletal survey prior to your next visit. Dr. Delton Coombes will see you back in 4 months for labs and follow up.   Thank you for choosing [facility] to provide your oncology and hematology care.  To afford each patient quality time with our provider, please arrive at least 15 minutes before your scheduled appointment time.   If you have a lab appointment with the [facility] please come in thru the Main Entrance and check in at the main information desk  You need to re-schedule your appointment should you arrive 10 or more minutes late.  We strive to give you quality time with our providers, and arriving late affects you and other patients whose appointments are after yours.  Also, if you no show three or more times for appointments you may be dismissed from the clinic at the providers discretion.     Again, thank you for choosing [facility].  Our hope is that these requests will decrease the amount of time that you wait before being seen by our physicians.       _____________________________________________________________  Should you have questions after your visit to [facility], please contact our office at [phone] between the hours of 8:00 a.m. and 4:30 p.m.  Voicemails left after 4:00 p.m. will not be returned until the following business day.  For prescription refill requests, have your pharmacy contact our office and allow 72 hours.    Cancer Center Support Programs:   > Cancer Support Group  2nd Tuesday of the month 1pm-2pm, Journey Room

## 2021-01-14 LAB — SURGICAL PATHOLOGY

## 2021-01-15 ENCOUNTER — Ambulatory Visit (HOSPITAL_COMMUNITY)
Admission: RE | Admit: 2021-01-15 | Discharge: 2021-01-15 | Disposition: A | Discharge status: Home / Self Care | Payer: PPO | Source: Ambulatory Visit | Attending: Hematology and Oncology | Admitting: Hematology and Oncology

## 2021-01-15 ENCOUNTER — Other Ambulatory Visit: Payer: Self-pay

## 2021-01-15 DIAGNOSIS — I340 Nonrheumatic mitral (valve) insufficiency: Secondary | ICD-10-CM | POA: Diagnosis not present

## 2021-01-15 DIAGNOSIS — D8989 Other specified disorders involving the immune mechanism, not elsewhere classified: Secondary | ICD-10-CM | POA: Insufficient documentation

## 2021-01-15 DIAGNOSIS — Z86711 Personal history of pulmonary embolism: Secondary | ICD-10-CM | POA: Diagnosis not present

## 2021-01-15 DIAGNOSIS — R0600 Dyspnea, unspecified: Secondary | ICD-10-CM | POA: Insufficient documentation

## 2021-01-15 DIAGNOSIS — I10 Essential (primary) hypertension: Secondary | ICD-10-CM | POA: Diagnosis not present

## 2021-01-15 DIAGNOSIS — E119 Type 2 diabetes mellitus without complications: Secondary | ICD-10-CM | POA: Diagnosis not present

## 2021-01-15 DIAGNOSIS — E8581 Light chain (AL) amyloidosis: Secondary | ICD-10-CM | POA: Diagnosis not present

## 2021-01-15 DIAGNOSIS — D472 Monoclonal gammopathy: Secondary | ICD-10-CM | POA: Diagnosis not present

## 2021-01-15 DIAGNOSIS — E850 Non-neuropathic heredofamilial amyloidosis: Secondary | ICD-10-CM | POA: Insufficient documentation

## 2021-01-15 DIAGNOSIS — N08 Glomerular disorders in diseases classified elsewhere: Secondary | ICD-10-CM | POA: Insufficient documentation

## 2021-01-15 LAB — ECHOCARDIOGRAM COMPLETE
AR max vel: 2.76 cm2
AV Area VTI: 2.79 cm2
AV Area mean vel: 2.52 cm2
AV Mean grad: 7 mmHg
AV Peak grad: 13.2 mmHg
Ao pk vel: 1.82 m/s
Area-P 1/2: 3.42 cm2
Calc EF: 49 %
MV VTI: 2.81 cm2
S' Lateral: 3.9 cm
Single Plane A2C EF: 55 %
Single Plane A4C EF: 42.5 %

## 2021-01-15 NOTE — Progress Notes (Signed)
*  PRELIMINARY RESULTS* Echocardiogram 2D Echocardiogram has been performed.  [name] [name] 01/15/2021, 1:42 PM

## 2021-01-16 DIAGNOSIS — I10 Essential (primary) hypertension: Secondary | ICD-10-CM | POA: Diagnosis not present

## 2021-01-16 DIAGNOSIS — I5021 Acute systolic (congestive) heart failure: Secondary | ICD-10-CM | POA: Diagnosis not present

## 2021-01-16 DIAGNOSIS — E1121 Type 2 diabetes mellitus with diabetic nephropathy: Secondary | ICD-10-CM | POA: Diagnosis not present

## 2021-01-16 DIAGNOSIS — Z992 Dependence on renal dialysis: Secondary | ICD-10-CM | POA: Diagnosis not present

## 2021-01-16 DIAGNOSIS — N185 Chronic kidney disease, stage 5: Secondary | ICD-10-CM | POA: Diagnosis not present

## 2021-01-16 DIAGNOSIS — N186 End stage renal disease: Secondary | ICD-10-CM | POA: Diagnosis not present

## 2021-01-19 DIAGNOSIS — N186 End stage renal disease: Secondary | ICD-10-CM | POA: Diagnosis not present

## 2021-01-19 DIAGNOSIS — Z992 Dependence on renal dialysis: Secondary | ICD-10-CM | POA: Diagnosis not present

## 2021-01-19 DIAGNOSIS — D509 Iron deficiency anemia, unspecified: Secondary | ICD-10-CM | POA: Diagnosis not present

## 2021-01-19 DIAGNOSIS — D631 Anemia in chronic kidney disease: Secondary | ICD-10-CM | POA: Diagnosis not present

## 2021-01-19 DIAGNOSIS — Z23 Encounter for immunization: Secondary | ICD-10-CM | POA: Diagnosis not present

## 2021-01-22 DIAGNOSIS — N185 Chronic kidney disease, stage 5: Secondary | ICD-10-CM | POA: Diagnosis not present

## 2021-01-22 DIAGNOSIS — Z6831 Body mass index (BMI) 31.0-31.9, adult: Secondary | ICD-10-CM | POA: Diagnosis not present

## 2021-01-22 DIAGNOSIS — I5021 Acute systolic (congestive) heart failure: Secondary | ICD-10-CM | POA: Diagnosis not present

## 2021-01-22 DIAGNOSIS — I10 Essential (primary) hypertension: Secondary | ICD-10-CM | POA: Diagnosis not present

## 2021-01-22 DIAGNOSIS — E1121 Type 2 diabetes mellitus with diabetic nephropathy: Secondary | ICD-10-CM | POA: Diagnosis not present

## 2021-02-04 ENCOUNTER — Other Ambulatory Visit (HOSPITAL_COMMUNITY): Payer: Self-pay | Admitting: Vascular Surgery

## 2021-02-04 ENCOUNTER — Ambulatory Visit (INDEPENDENT_AMBULATORY_CARE_PROVIDER_SITE_OTHER): Payer: PPO | Admitting: Vascular Surgery

## 2021-02-04 ENCOUNTER — Other Ambulatory Visit: Payer: Self-pay

## 2021-02-04 ENCOUNTER — Encounter: Payer: Self-pay | Admitting: Vascular Surgery

## 2021-02-04 ENCOUNTER — Ambulatory Visit (INDEPENDENT_AMBULATORY_CARE_PROVIDER_SITE_OTHER): Payer: PPO

## 2021-02-04 VITALS — BP 132/62 | HR 66 | Temp 98.1°F | Resp 16 | Ht 72.0 in | Wt 235.8 lb

## 2021-02-04 DIAGNOSIS — Z48812 Encounter for surgical aftercare following surgery on the circulatory system: Secondary | ICD-10-CM | POA: Diagnosis not present

## 2021-02-04 DIAGNOSIS — N186 End stage renal disease: Secondary | ICD-10-CM

## 2021-02-04 DIAGNOSIS — Z992 Dependence on renal dialysis: Secondary | ICD-10-CM

## 2021-02-04 NOTE — H&P (View-Only) (Signed)
   Vascular and Vein Specialist of [place]  Patient name: [name] [name] MRN: [id number] DOB: 03-May-1950 Sex: male  REASON FOR VISIT: Follow-up for stage brachiobasilic fistula and tunneled catheter placement on 12/23/2020  HPI: [name] [name] is a 70 y.o. male here today for follow-up.  He reports that he feels much better now being initiated on hemodialysis.  He continues to lose weight from his legs with fluid overload and looks quite good today.  Has had no difficulty with his tunneled catheter use.  He has no steal symptoms  Current Outpatient Medications  Medication Sig Dispense Refill   albuterol (VENTOLIN HFA) 108 (90 Base) MCG/ACT inhaler INHALE 2 PUFFS BY MOUTH EVERY 4 HOURS AS NEEDED ONLY  IF  YOU  CANT  CATCH  YOUR  BREATH 8 g 0   allopurinol (ZYLOPRIM) 300 MG tablet Take 300 mg by mouth daily.     amLODipine (NORVASC) 5 MG tablet Take 5 mg by mouth daily.     calcitRIOL (ROCALTROL) 0.25 MCG capsule Take 0.25 mcg by mouth daily.     fluticasone furoate-vilanterol (BREO ELLIPTA) 100-25 MCG/INH AEPB Inhale 1 puff into the lungs daily.     furosemide (LASIX) 80 MG tablet Take 80 mg by mouth 2 (two) times daily.     hydrALAZINE (APRESOLINE) 50 MG tablet Take 50 mg by mouth 3 (three) times daily.     insulin aspart (NOVOLOG) 100 UNIT/ML injection Inject 5 Units into the skin 3 (three) times daily as needed for high blood sugar.     insulin detemir (LEVEMIR) 100 UNIT/ML injection Inject 10 Units into the skin in the morning.     sevelamer carbonate (RENVELA) 800 MG tablet Take 800 mg by mouth 4 (four) times daily.     sodium bicarbonate 650 MG tablet Take 650 mg by mouth 2 (two) times daily.     Vitamin D, Ergocalciferol, (DRISDOL) 1.25 MG (50000 UNIT) CAPS capsule Take 50,000 Units by mouth once a week.     No current facility-administered medications for this visit.     PHYSICAL EXAM: Vitals:   02/04/21 1421  BP: 132/62  Pulse: 66  Resp: 16   Temp: 98.1 F (36.7 C)  TempSrc: Temporal  SpO2: 97%  Weight: 235 lb 12.8 oz (107 kg)  Height: 6' (1.829 m)    GENERAL: The patient is a well-nourished male, in no acute distress. The vital signs are documented above. Left arm brachial basilic fistula incision is completely healed.  He has an excellent thrill in his upper arm  Duplex today reveals excellent maturation in size of his basilic vein in the 8 mm range.  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  Explained the plan for second stage transposition.  I explained that he would have an incision over his original incision at the brachiobasilic anastomosis and then several vein harvest incisions in his medial upper arm and Rie tunneling of the vein in the subcutaneous tunnel.  We will coordinate this at his earliest convenience at [place], MD [facility] Vascular and Vein Specialists of [facility] Tel [phone]  Note: Portions of this report may have been transcribed using voice recognition software.  Every effort has been made to ensure accuracy; however, inadvertent computerized transcription errors may still be present.

## 2021-02-04 NOTE — Progress Notes (Signed)
   Vascular and Vein Specialist of [place]  Patient name: [name] [name] MRN: [id number] DOB: [birth year] Sex: male  REASON FOR VISIT: Follow-up for stage brachiobasilic fistula and tunneled catheter placement on 12/23/2020  HPI: [name] [name] is a 70 y.o. male here today for follow-up.  He reports that he feels much better now being initiated on hemodialysis.  He continues to lose weight from his legs with fluid overload and looks quite good today.  Has had no difficulty with his tunneled catheter use.  He has no steal symptoms  Current Outpatient Medications  Medication Sig Dispense Refill   albuterol (VENTOLIN HFA) 108 (90 Base) MCG/ACT inhaler INHALE 2 PUFFS BY MOUTH EVERY 4 HOURS AS NEEDED ONLY  IF  YOU  CANT  CATCH  YOUR  BREATH 8 g 0   allopurinol (ZYLOPRIM) 300 MG tablet Take 300 mg by mouth daily.     amLODipine (NORVASC) 5 MG tablet Take 5 mg by mouth daily.     calcitRIOL (ROCALTROL) 0.25 MCG capsule Take 0.25 mcg by mouth daily.     fluticasone furoate-vilanterol (BREO ELLIPTA) 100-25 MCG/INH AEPB Inhale 1 puff into the lungs daily.     furosemide (LASIX) 80 MG tablet Take 80 mg by mouth 2 (two) times daily.     hydrALAZINE (APRESOLINE) 50 MG tablet Take 50 mg by mouth 3 (three) times daily.     insulin aspart (NOVOLOG) 100 UNIT/ML injection Inject 5 Units into the skin 3 (three) times daily as needed for high blood sugar.     insulin detemir (LEVEMIR) 100 UNIT/ML injection Inject 10 Units into the skin in the morning.     sevelamer carbonate (RENVELA) 800 MG tablet Take 800 mg by mouth 4 (four) times daily.     sodium bicarbonate 650 MG tablet Take 650 mg by mouth 2 (two) times daily.     Vitamin D, Ergocalciferol, (DRISDOL) 1.25 MG (50000 UNIT) CAPS capsule Take 50,000 Units by mouth once a week.     No current facility-administered medications for this visit.     PHYSICAL EXAM: Vitals:   02/04/21 1421  BP: 132/62  Pulse: 66  Resp: 16   Temp: 98.1 F (36.7 C)  TempSrc: Temporal  SpO2: 97%  Weight: 235 lb 12.8 oz (107 kg)  Height: 6' (1.829 m)    GENERAL: The patient is a well-nourished male, in no acute distress. The vital signs are documented above. Left arm brachial basilic fistula incision is completely healed.  He has an excellent thrill in his upper arm  Duplex today reveals excellent maturation in size of his basilic vein in the 8 mm range.  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  Explained the plan for second stage transposition.  I explained that he would have an incision over his original incision at the brachiobasilic anastomosis and then several vein harvest incisions in his medial upper arm and Rie tunneling of the vein in the subcutaneous tunnel.  We will coordinate this at his earliest convenience at [place], MD [facility] Vascular and Vein Specialists of [facility] Tel [phone]  Note: Portions of this report may have been transcribed using voice recognition software.  Every effort has been made to ensure accuracy; however, inadvertent computerized transcription errors may still be present.

## 2021-02-06 ENCOUNTER — Other Ambulatory Visit: Payer: Self-pay

## 2021-02-09 ENCOUNTER — Encounter (HOSPITAL_COMMUNITY)
Admission: RE | Admit: 2021-02-09 | Discharge: 2021-02-09 | Disposition: A | Discharge status: Home / Self Care | Payer: PPO | Source: Ambulatory Visit | Attending: Vascular Surgery | Admitting: Vascular Surgery

## 2021-02-09 ENCOUNTER — Other Ambulatory Visit: Payer: Self-pay

## 2021-02-10 ENCOUNTER — Encounter (HOSPITAL_COMMUNITY): Payer: Self-pay | Admitting: Vascular Surgery

## 2021-02-10 ENCOUNTER — Ambulatory Visit (HOSPITAL_COMMUNITY)
Admission: RE | Admit: 2021-02-10 | Discharge: 2021-02-10 | Disposition: A | Discharge status: Home / Self Care | Payer: PPO | Attending: Vascular Surgery | Admitting: Vascular Surgery

## 2021-02-10 ENCOUNTER — Ambulatory Visit (HOSPITAL_COMMUNITY): Payer: PPO | Admitting: Anesthesiology

## 2021-02-10 ENCOUNTER — Encounter (HOSPITAL_COMMUNITY)
Admission: RE | Disposition: A | Discharge status: Home / Self Care | Payer: Self-pay | Source: Home / Self Care | Attending: Vascular Surgery

## 2021-02-10 ENCOUNTER — Other Ambulatory Visit: Payer: Self-pay

## 2021-02-10 DIAGNOSIS — I509 Heart failure, unspecified: Secondary | ICD-10-CM | POA: Diagnosis not present

## 2021-02-10 DIAGNOSIS — Z79899 Other long term (current) drug therapy: Secondary | ICD-10-CM | POA: Insufficient documentation

## 2021-02-10 DIAGNOSIS — E1122 Type 2 diabetes mellitus with diabetic chronic kidney disease: Secondary | ICD-10-CM | POA: Insufficient documentation

## 2021-02-10 DIAGNOSIS — I132 Hypertensive heart and chronic kidney disease with heart failure and with stage 5 chronic kidney disease, or end stage renal disease: Secondary | ICD-10-CM | POA: Diagnosis not present

## 2021-02-10 DIAGNOSIS — Z992 Dependence on renal dialysis: Secondary | ICD-10-CM

## 2021-02-10 DIAGNOSIS — Z794 Long term (current) use of insulin: Secondary | ICD-10-CM | POA: Diagnosis not present

## 2021-02-10 DIAGNOSIS — N186 End stage renal disease: Secondary | ICD-10-CM | POA: Diagnosis not present

## 2021-02-10 HISTORY — PX: BASCILIC VEIN TRANSPOSITION: SHX5742

## 2021-02-10 LAB — POCT I-STAT, CHEM 8
BUN: 77 mg/dL — ABNORMAL HIGH (ref 8–23)
Calcium, Ion: 1.09 mmol/L — ABNORMAL LOW (ref 1.15–1.40)
Chloride: 98 mmol/L (ref 98–111)
Creatinine, Ser: 8.6 mg/dL — ABNORMAL HIGH (ref 0.61–1.24)
Glucose, Bld: 93 mg/dL (ref 70–99)
HCT: 35 % — ABNORMAL LOW (ref 39.0–52.0)
Hemoglobin: 11.9 g/dL — ABNORMAL LOW (ref 13.0–17.0)
Potassium: 4.8 mmol/L (ref 3.5–5.1)
Sodium: 136 mmol/L (ref 135–145)
TCO2: 30 mmol/L (ref 22–32)

## 2021-02-10 LAB — GLUCOSE, CAPILLARY: Glucose-Capillary: 87 mg/dL (ref 70–99)

## 2021-02-10 SURGERY — TRANSPOSITION, VEIN, BASILIC
Anesthesia: General | Laterality: Left

## 2021-02-10 MED ORDER — PROPOFOL 10 MG/ML IV BOLUS
INTRAVENOUS | Status: AC
  Filled 2021-02-10: qty 20

## 2021-02-10 MED ORDER — OXYCODONE-ACETAMINOPHEN 5-325 MG PO TABS: 1.0000 | ORAL_TABLET | Freq: Four times a day (QID) | ORAL | 0 refills | Status: DC | PRN

## 2021-02-10 MED ORDER — OXYCODONE-ACETAMINOPHEN 5-325 MG PO TABS
1.0000 | ORAL_TABLET | Freq: Once | ORAL | Status: AC
  Administered 2021-02-10: 1 via ORAL

## 2021-02-10 MED ORDER — PHENYLEPHRINE 40 MCG/ML (10ML) SYRINGE FOR IV PUSH (FOR BLOOD PRESSURE SUPPORT)
PREFILLED_SYRINGE | INTRAVENOUS | Status: AC
  Filled 2021-02-10: qty 10

## 2021-02-10 MED ORDER — ONDANSETRON HCL 4 MG/2ML IJ SOLN: 4.0000 mg | Freq: Once | INTRAMUSCULAR | Status: DC | PRN

## 2021-02-10 MED ORDER — ONDANSETRON HCL 4 MG/2ML IJ SOLN
INTRAMUSCULAR | Status: AC
  Filled 2021-02-10: qty 2

## 2021-02-10 MED ORDER — HEPARIN 6000 UNIT IRRIGATION SOLUTION
Status: DC | PRN
  Administered 2021-02-10: 1

## 2021-02-10 MED ORDER — FENTANYL CITRATE (PF) 100 MCG/2ML IJ SOLN
INTRAMUSCULAR | Status: DC | PRN
  Administered 2021-02-10 (×3): 50 ug via INTRAVENOUS

## 2021-02-10 MED ORDER — EPHEDRINE SULFATE 50 MG/ML IJ SOLN
INTRAMUSCULAR | Status: DC | PRN
  Administered 2021-02-10: 10 mg via INTRAVENOUS

## 2021-02-10 MED ORDER — ONDANSETRON HCL 4 MG/2ML IJ SOLN
INTRAMUSCULAR | Status: DC | PRN
  Administered 2021-02-10: 4 mg via INTRAVENOUS

## 2021-02-10 MED ORDER — LIDOCAINE HCL (PF) 2 % IJ SOLN
INTRAMUSCULAR | Status: AC
  Filled 2021-02-10: qty 10

## 2021-02-10 MED ORDER — CEFAZOLIN SODIUM-DEXTROSE 2-4 GM/100ML-% IV SOLN
INTRAVENOUS | Status: AC
  Filled 2021-02-10: qty 100

## 2021-02-10 MED ORDER — GLYCOPYRROLATE 0.2 MG/ML IJ SOLN
INTRAMUSCULAR | Status: DC | PRN
  Administered 2021-02-10: .2 mg via INTRAVENOUS

## 2021-02-10 MED ORDER — MIDAZOLAM HCL 5 MG/5ML IJ SOLN
INTRAMUSCULAR | Status: DC | PRN
  Administered 2021-02-10 (×2): 1 mg via INTRAVENOUS

## 2021-02-10 MED ORDER — PHENYLEPHRINE 40 MCG/ML (10ML) SYRINGE FOR IV PUSH (FOR BLOOD PRESSURE SUPPORT)
PREFILLED_SYRINGE | INTRAVENOUS | Status: DC | PRN
  Administered 2021-02-10: 80 ug via INTRAVENOUS

## 2021-02-10 MED ORDER — HEPARIN SODIUM (PORCINE) 1000 UNIT/ML IJ SOLN
INTRAMUSCULAR | Status: AC
  Filled 2021-02-10: qty 6

## 2021-02-10 MED ORDER — PROPOFOL 10 MG/ML IV BOLUS
INTRAVENOUS | Status: DC | PRN
  Administered 2021-02-10: 140 mg via INTRAVENOUS
  Administered 2021-02-10: 20 mg via INTRAVENOUS
  Administered 2021-02-10: 50 mg via INTRAVENOUS

## 2021-02-10 MED ORDER — FENTANYL CITRATE (PF) 250 MCG/5ML IJ SOLN
INTRAMUSCULAR | Status: AC
  Filled 2021-02-10: qty 5

## 2021-02-10 MED ORDER — CEFAZOLIN SODIUM-DEXTROSE 2-3 GM-%(50ML) IV SOLR
INTRAVENOUS | Status: DC | PRN
  Administered 2021-02-10: 2 g via INTRAVENOUS

## 2021-02-10 MED ORDER — FENTANYL CITRATE PF 50 MCG/ML IJ SOSY: 25.0000 ug | PREFILLED_SYRINGE | INTRAMUSCULAR | Status: DC | PRN

## 2021-02-10 MED ORDER — 0.9 % SODIUM CHLORIDE (POUR BTL) OPTIME
TOPICAL | Status: DC | PRN
  Administered 2021-02-10: 1000 mL

## 2021-02-10 MED ORDER — SODIUM CHLORIDE 0.9 % IV SOLN: INTRAVENOUS | Status: DC | PRN

## 2021-02-10 MED ORDER — LIDOCAINE HCL (CARDIAC) PF 50 MG/5ML IV SOSY
PREFILLED_SYRINGE | INTRAVENOUS | Status: DC | PRN
  Administered 2021-02-10: 80 mg via INTRAVENOUS

## 2021-02-10 MED ORDER — MIDAZOLAM HCL 2 MG/2ML IJ SOLN
INTRAMUSCULAR | Status: AC
  Filled 2021-02-10: qty 2

## 2021-02-10 MED ORDER — PHENYLEPHRINE HCL-NACL 20-0.9 MG/250ML-% IV SOLN
INTRAVENOUS | Status: DC | PRN
  Administered 2021-02-10: 20 ug/min via INTRAVENOUS

## 2021-02-10 MED ORDER — LIDOCAINE-EPINEPHRINE 0.5 %-1:200000 IJ SOLN
INTRAMUSCULAR | Status: AC
  Filled 2021-02-10: qty 1

## 2021-02-10 MED ORDER — OXYCODONE-ACETAMINOPHEN 5-325 MG PO TABS
ORAL_TABLET | ORAL | Status: AC
  Filled 2021-02-10: qty 1

## 2021-02-10 SURGICAL SUPPLY — 43 items
ARMBAND PINK RESTRICT EXTREMIT (MISCELLANEOUS) ×2 IMPLANT
BAG HAMPER (MISCELLANEOUS) ×2 IMPLANT
CANNULA VESSEL 3MM 2 BLNT TIP (CANNULA) ×2 IMPLANT
CLIP LIGATING EXTRA MED SLVR (CLIP) ×2 IMPLANT
CLIP LIGATING EXTRA SM BLUE (MISCELLANEOUS) ×2 IMPLANT
COVER LIGHT HANDLE STERIS (MISCELLANEOUS) ×2 IMPLANT
COVER MAYO STAND XLG (MISCELLANEOUS) ×2 IMPLANT
COVER PROBE U/S 5X48 (MISCELLANEOUS) IMPLANT
DERMABOND ADVANCED (GAUZE/BANDAGES/DRESSINGS) ×2
DERMABOND ADVANCED .7 DNX12 (GAUZE/BANDAGES/DRESSINGS) ×2 IMPLANT
ELECT REM PT RETURN 9FT ADLT (ELECTROSURGICAL) ×2
ELECTRODE REM PT RTRN 9FT ADLT (ELECTROSURGICAL) ×1 IMPLANT
GAUZE SPONGE 4X4 12PLY STRL (GAUZE/BANDAGES/DRESSINGS) ×2 IMPLANT
GLOVE SURG MICRO LTX SZ7.5 (GLOVE) ×2 IMPLANT
GLOVE SURG POLYISO LF SZ6.5 (GLOVE) ×2 IMPLANT
GLOVE SURG UNDER POLY LF SZ7 (GLOVE) ×6 IMPLANT
GOWN STRL REUS W/TWL LRG LVL3 (GOWN DISPOSABLE) ×6 IMPLANT
IV NS 500ML (IV SOLUTION) ×1
IV NS 500ML BAXH (IV SOLUTION) ×1 IMPLANT
KIT BLADEGUARD II DBL (SET/KITS/TRAYS/PACK) ×2 IMPLANT
KIT TURNOVER KIT A (KITS) ×2 IMPLANT
MANIFOLD NEPTUNE II (INSTRUMENTS) ×2 IMPLANT
MARKER SKIN DUAL TIP RULER LAB (MISCELLANEOUS) ×4 IMPLANT
NEEDLE HYPO 18GX1.5 BLUNT FILL (NEEDLE) ×4 IMPLANT
NS IRRIG 1000ML POUR BTL (IV SOLUTION) ×2 IMPLANT
PACK CV ACCESS (CUSTOM PROCEDURE TRAY) ×2 IMPLANT
PAD ARMBOARD 7.5X6 YLW CONV (MISCELLANEOUS) ×2 IMPLANT
POSITIONER HEAD 8X9X4 ADT (SOFTGOODS) ×2 IMPLANT
SET BASIN LINEN APH (SET/KITS/TRAYS/PACK) ×2 IMPLANT
SOL PREP POV-IOD 4OZ 10% (MISCELLANEOUS) ×2 IMPLANT
SOL PREP PROV IODINE SCRUB 4OZ (MISCELLANEOUS) ×2 IMPLANT
SPONGE T-LAP 18X18 ~~LOC~~+RFID (SPONGE) ×2 IMPLANT
SUT PROLENE 6 0 CC (SUTURE) ×2 IMPLANT
SUT SILK 2 0 SH (SUTURE) ×2 IMPLANT
SUT SILK 3 0 (SUTURE)
SUT SILK 3-0 18XBRD TIE 12 (SUTURE) IMPLANT
SUT VIC AB 3-0 SH 27 (SUTURE) ×1
SUT VIC AB 3-0 SH 27X BRD (SUTURE) ×1 IMPLANT
SYR 10ML LL (SYRINGE) ×2 IMPLANT
SYR 50ML LL SCALE MARK (SYRINGE) IMPLANT
SYR BULB IRRIG 60ML STRL (SYRINGE) ×2 IMPLANT
UNDERPAD 30X36 HEAVY ABSORB (UNDERPADS AND DIAPERS) ×2 IMPLANT
WATER STERILE IRR 1000ML POUR (IV SOLUTION) ×2 IMPLANT

## 2021-02-10 NOTE — Discharge Instructions (Signed)
[facility] of [facility]  Discharge Instructions  AV Fistula or Graft Surgery for Dialysis Access  Please refer to the following instructions for your post-procedure care. Your surgeon or physician assistant will discuss any changes with you.  Activity  You may drive the day following your surgery, if you are comfortable and no longer taking prescription pain medication. Resume full activity as the soreness in your incision resolves.  Bathing/Showering  You may shower after you go home. Keep your incision dry for 48 hours. Do not soak in a bathtub, hot tub, or swim until the incision heals completely. You may not shower if you have a hemodialysis catheter.  Incision Care  Clean your incision with mild soap and water after 48 hours. Pat the area dry with a clean towel. You do not need a bandage unless otherwise instructed. Do not apply any ointments or creams to your incision. You may have skin glue on your incision. Do not peel it off. It will come off on its own in about one week. Your arm may swell a bit after surgery. To reduce swelling use pillows to elevate your arm so it is above your heart. Your doctor will tell you if you need to lightly wrap your arm with an ACE bandage.  Diet  Resume your normal diet. There are not special food restrictions following this procedure. In order to heal from your surgery, it is CRITICAL to get adequate nutrition. Your body requires vitamins, minerals, and protein. Vegetables are the best source of vitamins and minerals. Vegetables also provide the perfect balance of protein. Processed food has little nutritional value, so try to avoid this.  Medications  Resume taking all of your medications. If your incision is causing pain, you may take over-the counter pain relievers such as acetaminophen (Tylenol). If you were prescribed a stronger pain medication, please be aware these medications can cause nausea and constipation. Prevent  nausea by taking the medication with a snack or meal. Avoid constipation by drinking plenty of fluids and eating foods with high amount of fiber, such as fruits, vegetables, and grains.  Do not take Tylenol if you are taking prescription pain medications.  Follow up Your surgeon may want to see you in the office following your access surgery. If so, this will be arranged at the time of your surgery.  Please call us immediately for any of the following conditions:  Increased pain, redness, drainage (pus) from your incision site Fever of 101 degrees or higher Severe or worsening pain at your incision site Hand pain or numbness.  Reduce your risk of vascular disease:  Stop smoking. If you would like help, call [facility] at 1-800-QUIT-NOW 608-262-2670) or [place] at [place] your cholesterol Maintain a desired weight Control your diabetes Keep your blood pressure down  Dialysis  It will take several weeks to several months for your new dialysis access to be ready for use. Your surgeon will determine when it is okay to use it. Your nephrologist will continue to direct your dialysis. You can continue to use your Permcath until your new access is ready for use.   02/10/2021 [name] [name] [id number] April 12, 1951  Surgeon(s): Virgia Kelner, Arvilla Meres, MD  Procedure(s): LEFT ARM SECOND STAGE BASILIC VEIN TRANSPOSITION   May stick graft immediately   May stick graft on designated area only:    Do not stick fistula for 6 weeks    If you have any questions, please call the office at [phone].

## 2021-02-10 NOTE — Anesthesia Procedure Notes (Signed)
Procedure Name: LMA Insertion Date/Time: 02/10/2021 9:38 AM Performed by: Ollen Bowl, CRNA Pre-anesthesia Checklist: Patient identified, Patient being monitored, Emergency Drugs available, Timeout performed and Suction available Patient Re-evaluated:Patient Re-evaluated prior to induction Oxygen Delivery Method: Circle System Utilized Preoxygenation: Pre-oxygenation with 100% oxygen Induction Type: IV induction Ventilation: Mask ventilation without difficulty LMA: LMA inserted LMA Size: 4.0 Number of attempts: 1 Placement Confirmation: positive ETCO2 and breath sounds checked- equal and bilateral

## 2021-02-10 NOTE — Anesthesia Preprocedure Evaluation (Addendum)
Anesthesia Evaluation  Patient identified by MRN, date of birth, ID band Patient awake    Reviewed: Allergy & Precautions, NPO status , Patient's Chart, lab work & pertinent test results  History of Anesthesia Complications Negative for: history of anesthetic complications  Airway Mallampati: II  TM Distance: >3 FB Neck ROM: Full    Dental  (+) Dental Advisory Given, Missing, Loose,    Pulmonary shortness of breath, with exertion and lying, asthma , PE   Pulmonary exam normal breath sounds clear to auscultation       Cardiovascular Exercise Tolerance: Poor hypertension, Pt. on medications +CHF and + DOE  + dysrhythmias (PVCs, PACs)  Rhythm:Irregular Rate:Normal  1. Left ventricular ejection fraction, by estimation, is 35 to 40%. The left ventricle has moderately decreased function. The left ventricle  demonstrates global hypokinesis. There is mild left ventricular hypertrophy. Left ventricular diastolic  parameters are consistent with Grade I diastolic dysfunction (impaired  relaxation).  2. Right ventricular systolic function is normal. The right ventricular  size is normal. There is normal pulmonary artery systolic pressure. The estimated right ventricular systolic pressure is 54.6 mmHg.  3. Left atrial size was moderately dilated.  4. Catheter tip visualized. Right atrial size was mildly dilated.  5. The mitral valve is grossly normal. Mild mitral valve regurgitation.  6. The aortic valve is tricuspid. Aortic valve regurgitation is not visualized. Aortic valve mean gradient measures 7.0 mmHg.  7. The inferior vena cava is normal in size with greater than 50% respiratory variability, suggesting right atrial pressure of 3 mmHg.   Comparison(s): Prior images reviewed side by side. LVEF has decreased  since prior study.  Occasional PVCs and PACs   Neuro/Psych PSYCHIATRIC DISORDERS Depression negative neurological ROS      GI/Hepatic negative GI ROS, Neg liver ROS,   Endo/Other  diabetes, Well Controlled, Type 2, Insulin Dependent  Renal/GU ESRF and Renal InsufficiencyRenal disease     Musculoskeletal negative musculoskeletal ROS (+)   Abdominal   Peds  Hematology negative hematology ROS (+)   Anesthesia Other Findings   Reproductive/Obstetrics negative OB ROS                            Anesthesia Physical  Anesthesia Plan  ASA: 4  Anesthesia Plan: General   Post-op Pain Management:    Induction: Intravenous  PONV Risk Score and Plan:   Airway Management Planned: LMA  Additional Equipment:   Intra-op Plan:   Post-operative Plan: Extubation in OR  Informed Consent: I have reviewed the patients History and Physical, chart, labs and discussed the procedure including the risks, benefits and alternatives for the proposed anesthesia with the patient or authorized representative who has indicated his/her understanding and acceptance.     Dental advisory given  Plan Discussed with: CRNA and Surgeon  Anesthesia Plan Comments: (Possible GA with airway was discussed.)       Anesthesia Quick Evaluation

## 2021-02-10 NOTE — Transfer of Care (Signed)
Immediate Anesthesia Transfer of Care Note  Patient: [name] [name]  Procedure(s) Performed: LEFT ARM SECOND STAGE BASILIC VEIN TRANSPOSITION (Left)  Patient Location: PACU  Anesthesia Type:General  Level of Consciousness: awake  Airway & Oxygen Therapy: Patient Spontanous Breathing  Post-op Assessment: Report given to RN  Post vital signs: Reviewed and stable  Last Vitals:  Vitals Value Taken Time  BP 100/73 02/10/21 1215  Temp 36.7 C 02/10/21 1212  Pulse 66 02/10/21 1216  Resp 14 02/10/21 1216  SpO2 95 % 02/10/21 1216  Vitals shown include unvalidated device data.  Last Pain:  Vitals:   02/10/21 0811  TempSrc: Oral  PainSc: 0-No pain      Patients Stated Pain Goal: 8 (02/11/84 2778)  Complications: No notable events documented.

## 2021-02-10 NOTE — Op Note (Signed)
    OPERATIVE REPORT  DATE OF SURGERY: 02/10/2021  PATIENT: [name] [name], 70 y.o. male MRN: [id number]  DOB: [birth year]  PRE-OPERATIVE DIAGNOSIS: End-stage renal disease  POST-OPERATIVE DIAGNOSIS:  Same  PROCEDURE: Left second stage brachiobasilic vein transposition  SURGEON:  Curt Jews, M.D.  PHYSICIAN ASSISTANT: Vevelyn Royals, RN  The assistant was needed for exposure and to expedite the case  ANESTHESIA: LMA  EBL: per anesthesia record  Total I/O In: 500 [I.V.:500] Out: 20 [Blood:20]  BLOOD ADMINISTERED: none  DRAINS: none  SPECIMEN: none  COUNTS CORRECT:  YES  PATIENT DISPOSITION:  PACU - hemodynamically stable  PROCEDURE DETAILS: The patient was taken operating placed supine position with area of the left arm left axilla were prepped and draped in usual sterile fashion.  The basilic vein was marked with SonoSite ultrasound on the skin.  Incision was made over the prior brachial artery to basilic vein anastomosis at the antecubital space and the brachial artery was exposed proximal distal to the old anastomosis.  The basilic vein was mobilized through scar at the antecubital space.  Several additional incisions were made on the medial upper arm leaving skin bridges the last of these being at the axilla.  The patient had a very good caliber vein.  Tributary branches were ligated with 3-0 and 2-0 silk ties and divided.  The brachial artery was occluded proximal distal to the old anastomosis and the vein was excised from the old anastomosis.  The vein was brought out of its native tunnel and was marked to reduce risk of twisting.  The vein was gently dilated and was of excellent size.  A subcutaneous tunnel was created from the antecubital space to the axilla and the vein was brought back through the subcutaneous tunnel.  The vein was cut to the appropriate length and was spatulated and sewn end-to-side to the brachial artery with a running 6-0 Prolene suture.  Clamps were  removed and excellent thrill was noted.  The wounds were irrigated with saline.  Hemostasis was obtained with electrocautery.  The wounds were closed with 3-0 Vicryl in the subcutaneous and subcuticular tissue.  Sterile dressing was applied and the patient was transferred to the recovery room in stable condition   Rosetta Posner, M.D., [facility] 02/10/2021 12:04 PM  Note: Portions of this report may have been transcribed using voice recognition software.  Every effort has been made to ensure accuracy; however, inadvertent computerized transcription errors may still be present.

## 2021-02-10 NOTE — Anesthesia Postprocedure Evaluation (Signed)
Anesthesia Post Note  Patient: [name] [name]  Procedure(s) Performed: LEFT ARM SECOND STAGE BASILIC VEIN TRANSPOSITION (Left)  Patient location during evaluation: PACU Anesthesia Type: General Level of consciousness: awake and alert and oriented Pain management: pain level controlled Vital Signs Assessment: post-procedure vital signs reviewed and stable Respiratory status: spontaneous breathing, nonlabored ventilation and respiratory function stable Cardiovascular status: blood pressure returned to baseline and stable Postop Assessment: no apparent nausea or vomiting Anesthetic complications: no   No notable events documented.   Last Vitals:  Vitals:   02/10/21 1245 02/10/21 1307  BP: 140/72 (!) 160/80  Pulse: 65 90  Resp: 16 17  Temp:  36.7 C  SpO2: 100% 97%    Last Pain:  Vitals:   02/10/21 1359  TempSrc:   PainSc: 4                  [name] [name] C [name] [name]

## 2021-02-10 NOTE — Interval H&P Note (Signed)
History and Physical Interval Note:  02/10/2021 8:56 AM  [name] [name]  has presented today for surgery, with the diagnosis of ESRD.  The various methods of treatment have been discussed with the patient and family. After consideration of risks, benefits and other options for treatment, the patient has consented to  Procedure(s): LEFT ARM SECOND STAGE BASILIC VEIN TRANSPOSITION (Left) as a surgical intervention.  The patient's history has been reviewed, patient examined, no change in status, stable for surgery.  I have reviewed the patient's chart and labs.  Questions were answered to the patient's satisfaction.     Curt Jews

## 2021-02-11 ENCOUNTER — Encounter (HOSPITAL_COMMUNITY): Payer: Self-pay | Admitting: Vascular Surgery

## 2021-02-16 DIAGNOSIS — N186 End stage renal disease: Secondary | ICD-10-CM | POA: Diagnosis not present

## 2021-02-16 DIAGNOSIS — Z992 Dependence on renal dialysis: Secondary | ICD-10-CM | POA: Diagnosis not present

## 2021-02-18 DIAGNOSIS — D631 Anemia in chronic kidney disease: Secondary | ICD-10-CM | POA: Diagnosis not present

## 2021-02-18 DIAGNOSIS — Z23 Encounter for immunization: Secondary | ICD-10-CM | POA: Diagnosis not present

## 2021-02-18 DIAGNOSIS — Z992 Dependence on renal dialysis: Secondary | ICD-10-CM | POA: Diagnosis not present

## 2021-02-18 DIAGNOSIS — D509 Iron deficiency anemia, unspecified: Secondary | ICD-10-CM | POA: Diagnosis not present

## 2021-02-18 DIAGNOSIS — N186 End stage renal disease: Secondary | ICD-10-CM | POA: Diagnosis not present

## 2021-03-18 DIAGNOSIS — N186 End stage renal disease: Secondary | ICD-10-CM | POA: Diagnosis not present

## 2021-03-18 DIAGNOSIS — Z992 Dependence on renal dialysis: Secondary | ICD-10-CM | POA: Diagnosis not present

## 2021-03-20 DIAGNOSIS — D631 Anemia in chronic kidney disease: Secondary | ICD-10-CM | POA: Diagnosis not present

## 2021-03-20 DIAGNOSIS — N2581 Secondary hyperparathyroidism of renal origin: Secondary | ICD-10-CM | POA: Diagnosis not present

## 2021-03-20 DIAGNOSIS — Z992 Dependence on renal dialysis: Secondary | ICD-10-CM | POA: Diagnosis not present

## 2021-03-20 DIAGNOSIS — N186 End stage renal disease: Secondary | ICD-10-CM | POA: Diagnosis not present

## 2021-03-20 DIAGNOSIS — D509 Iron deficiency anemia, unspecified: Secondary | ICD-10-CM | POA: Diagnosis not present

## 2021-03-25 ENCOUNTER — Encounter: Payer: PPO | Admitting: Vascular Surgery

## 2021-04-01 ENCOUNTER — Other Ambulatory Visit: Payer: Self-pay

## 2021-04-01 ENCOUNTER — Encounter: Payer: Self-pay | Admitting: Vascular Surgery

## 2021-04-01 ENCOUNTER — Ambulatory Visit (INDEPENDENT_AMBULATORY_CARE_PROVIDER_SITE_OTHER): Payer: PPO | Admitting: Vascular Surgery

## 2021-04-01 VITALS — BP 143/74 | HR 73 | Temp 97.9°F | Resp 18 | Ht 72.0 in | Wt 234.0 lb

## 2021-04-01 DIAGNOSIS — N186 End stage renal disease: Secondary | ICD-10-CM

## 2021-04-01 DIAGNOSIS — Z992 Dependence on renal dialysis: Secondary | ICD-10-CM

## 2021-04-01 NOTE — Progress Notes (Signed)
° °  Vascular and Vein Specialist of [place]  Patient name: [name] [name] MRN: [id number] DOB: [birth year] Sex: male  REASON FOR VISIT: Follow-up left second stage basilic vein transposition on 02/10/2021  HPI: [name] [name] is a 70 y.o. male here today for follow-up.  Still using his right IJ tunneled catheter without difficulty.  He underwent second stage basilic vein transposition left arm on 02/11/2019  Current Outpatient Medications  Medication Sig Dispense Refill   albuterol (VENTOLIN HFA) 108 (90 Base) MCG/ACT inhaler INHALE 2 PUFFS BY MOUTH EVERY 4 HOURS AS NEEDED ONLY  IF  YOU  CANT  CATCH  YOUR  BREATH (Patient taking differently: INHALE 2 PUFFS BY MOUTH EVERY 4 HOURS AS NEEDED ONLY  IF  YOU  CANT  CATCH  YOUR  BREATH) 8 g 0   allopurinol (ZYLOPRIM) 300 MG tablet Take 300 mg by mouth in the morning.     amLODipine (NORVASC) 5 MG tablet Take 5 mg by mouth daily.     calcitRIOL (ROCALTROL) 0.25 MCG capsule Take 0.5 mcg by mouth every Monday, Wednesday, and Friday with hemodialysis.     carvedilol (COREG) 12.5 MG tablet Take 12.5 mg by mouth 2 (two) times daily with a meal.     fluticasone furoate-vilanterol (BREO ELLIPTA) 100-25 MCG/INH AEPB Inhale 1 puff into the lungs daily as needed (respiratory issues).     furosemide (LASIX) 80 MG tablet Take 80 mg by mouth 2 (two) times daily.     hydrALAZINE (APRESOLINE) 50 MG tablet Take 50 mg by mouth in the morning and at bedtime. In the evening & at bedtime     insulin aspart (NOVOLOG FLEXPEN) 100 UNIT/ML FlexPen Inject 5-10 Units into the skin with breakfast, with lunch, and with evening meal. Sliding scale insulin     insulin detemir (LEVEMIR) 100 UNIT/ML injection Inject 5-10 Units into the skin at bedtime.     oxyCODONE-acetaminophen (PERCOCET) 5-325 MG tablet Take 1 tablet by mouth every 6 (six) hours as needed for severe pain. 8 tablet 0   sevelamer carbonate (RENVELA) 800 MG tablet Take 800 mg by  mouth in the morning and at bedtime.     No current facility-administered medications for this visit.     PHYSICAL EXAM: Vitals:   04/01/21 1449  BP: (!) 143/74  Pulse: 73  Resp: 18  Temp: 97.9 F (36.6 C)  TempSrc: Oral  SpO2: 99%  Weight: 234 lb (106.1 kg)  Height: 6' (1.829 m)    GENERAL: The patient is a well-nourished male, in no acute distress. The vital signs are documented above. Excellent healing of all surgical incisions in his left arm.  Excellent maturation of size with a very superficial running basilic vein fistula.  MEDICAL ISSUES: Doing well status post second stage basilic vein transposition fistula on 02/10/2021.  Okay to begin using this at any time.  I will communicate this to the dialysis staff in [place].  After he has had successful sessions with his fistula, I am available to remove his right IJ tunneled catheter as indicated   Rosetta Posner, MD FACS [facility] [place] Office Tel [phone]  Note: Portions of this report may have been transcribed using voice recognition software.  Every effort has been made to ensure accuracy; however, inadvertent computerized transcription errors may still be present.

## 2021-04-18 DIAGNOSIS — N186 End stage renal disease: Secondary | ICD-10-CM | POA: Diagnosis not present

## 2021-04-18 DIAGNOSIS — Z992 Dependence on renal dialysis: Secondary | ICD-10-CM | POA: Diagnosis not present

## 2021-04-20 DIAGNOSIS — D509 Iron deficiency anemia, unspecified: Secondary | ICD-10-CM | POA: Diagnosis not present

## 2021-04-20 DIAGNOSIS — D631 Anemia in chronic kidney disease: Secondary | ICD-10-CM | POA: Diagnosis not present

## 2021-04-20 DIAGNOSIS — N186 End stage renal disease: Secondary | ICD-10-CM | POA: Diagnosis not present

## 2021-04-20 DIAGNOSIS — Z992 Dependence on renal dialysis: Secondary | ICD-10-CM | POA: Diagnosis not present

## 2021-04-28 DIAGNOSIS — N185 Chronic kidney disease, stage 5: Secondary | ICD-10-CM | POA: Diagnosis not present

## 2021-04-28 DIAGNOSIS — E1121 Type 2 diabetes mellitus with diabetic nephropathy: Secondary | ICD-10-CM | POA: Diagnosis not present

## 2021-04-28 DIAGNOSIS — I5022 Chronic systolic (congestive) heart failure: Secondary | ICD-10-CM | POA: Diagnosis not present

## 2021-04-28 DIAGNOSIS — Z6831 Body mass index (BMI) 31.0-31.9, adult: Secondary | ICD-10-CM | POA: Diagnosis not present

## 2021-04-28 DIAGNOSIS — I10 Essential (primary) hypertension: Secondary | ICD-10-CM | POA: Diagnosis not present

## 2021-05-01 ENCOUNTER — Other Ambulatory Visit: Payer: Self-pay

## 2021-05-01 ENCOUNTER — Telehealth: Payer: Self-pay

## 2021-05-01 NOTE — Telephone Encounter (Signed)
Received telephone call from Ramiro Harvest, PA for tunneled dialysis catheter removal with Dr. Donnetta Hutching - pt scheduled for 05/05/21.

## 2021-05-05 ENCOUNTER — Encounter (HOSPITAL_COMMUNITY)
Admission: RE | Disposition: A | Discharge status: Home / Self Care | Payer: Self-pay | Source: Home / Self Care | Attending: Vascular Surgery

## 2021-05-05 ENCOUNTER — Encounter (HOSPITAL_COMMUNITY): Payer: Self-pay | Admitting: Vascular Surgery

## 2021-05-05 ENCOUNTER — Ambulatory Visit (HOSPITAL_COMMUNITY)
Admission: RE | Admit: 2021-05-05 | Discharge: 2021-05-05 | Disposition: A | Discharge status: Home / Self Care | Payer: PPO | Attending: Vascular Surgery | Admitting: Vascular Surgery

## 2021-05-05 DIAGNOSIS — N186 End stage renal disease: Secondary | ICD-10-CM | POA: Diagnosis not present

## 2021-05-05 DIAGNOSIS — Z794 Long term (current) use of insulin: Secondary | ICD-10-CM | POA: Insufficient documentation

## 2021-05-05 DIAGNOSIS — Z4901 Encounter for fitting and adjustment of extracorporeal dialysis catheter: Secondary | ICD-10-CM | POA: Diagnosis not present

## 2021-05-05 DIAGNOSIS — Z992 Dependence on renal dialysis: Secondary | ICD-10-CM | POA: Diagnosis not present

## 2021-05-05 HISTORY — PX: REMOVAL OF A DIALYSIS CATHETER: SHX6053

## 2021-05-05 LAB — GLUCOSE, CAPILLARY: Glucose-Capillary: 87 mg/dL (ref 70–99)

## 2021-05-05 SURGERY — REMOVAL, DIALYSIS CATHETER
Anesthesia: LOCAL

## 2021-05-05 MED ORDER — LIDOCAINE HCL (PF) 1 % IJ SOLN
INTRAMUSCULAR | Status: AC
  Filled 2021-05-05: qty 30

## 2021-05-05 MED ORDER — LIDOCAINE HCL (PF) 1 % IJ SOLN
INTRAMUSCULAR | Status: DC | PRN
  Administered 2021-05-05: 6 mL

## 2021-05-05 SURGICAL SUPPLY — 20 items
APPLICATOR CHLORAPREP 10.5 ORG (MISCELLANEOUS) ×2 IMPLANT
CLOTH BEACON ORANGE TIMEOUT ST (SAFETY) ×2 IMPLANT
DECANTER SPIKE VIAL GLASS SM (MISCELLANEOUS) ×2 IMPLANT
DERMABOND ADVANCED (GAUZE/BANDAGES/DRESSINGS) ×1
DERMABOND ADVANCED .7 DNX12 (GAUZE/BANDAGES/DRESSINGS) ×1 IMPLANT
DRAPE HALF SHEET 40X57 (DRAPES) IMPLANT
ELECT REM PT RETURN 9FT ADLT (ELECTROSURGICAL) ×2
ELECTRODE REM PT RTRN 9FT ADLT (ELECTROSURGICAL) ×1 IMPLANT
GLOVE SURG POLYISO LF SZ7.5 (GLOVE) IMPLANT
GLOVE SURG UNDER POLY LF SZ7 (GLOVE) IMPLANT
GOWN STRL REUS W/TWL LRG LVL3 (GOWN DISPOSABLE) IMPLANT
NDL HYPO 25X1 1.5 SAFETY (NEEDLE) ×1 IMPLANT
NEEDLE HYPO 25X1 1.5 SAFETY (NEEDLE) ×2 IMPLANT
PENCIL SMOKE EVACUATOR COATED (MISCELLANEOUS) IMPLANT
SPONGE GAUZE 2X2 8PLY STRL LF (GAUZE/BANDAGES/DRESSINGS) ×2 IMPLANT
SUT MNCRL AB 4-0 PS2 18 (SUTURE) ×2 IMPLANT
SUT VIC AB 3-0 SH 27 (SUTURE) ×1
SUT VIC AB 3-0 SH 27X BRD (SUTURE) ×1 IMPLANT
SYR CONTROL 10ML LL (SYRINGE) ×2 IMPLANT
TOWEL OR 17X26 4PK STRL BLUE (TOWEL DISPOSABLE) ×2 IMPLANT

## 2021-05-05 NOTE — H&P (Signed)
Office Visit 04/01/2021 Vascular Vein Specialist-[employer] Rheda Kassab, Arvilla Meres, MD Vascular Surgery ESRD on dialysis [facility]) Dx Follow-up ; Referred by Neale Burly, MD Reason for Visit   Additional Documentation  Vitals:  BP 143/74 Important   (BP Location: Left Arm, Patient Position: Sitting, Cuff Size: Normal) Pulse 73 Temp 97.9 F (36.6 C) (Oral) Resp 18 Ht 6' (1.829 m) Wt 106.1 kg SpO2 99% BMI 31.74 kg/m BSA 2.32 m  Flowsheets:  Vital Signs, NEWS, MEWS Score, Anthropometrics, Infectious Disease Screening, Clinical Intake, Method of Visit   Encounter Info:  Billing Info, History, Allergies, Detailed Report    All Notes   Progress Notes by Rosetta Posner, MD at 04/01/2021 3:00 PM  Author: Rosetta Posner, MD Author Type: Physician Filed: 04/01/2021  3:08 PM  Note Status: Signed Cosign: Cosign Not Required Encounter Date: 04/01/2021  Editor: Rosetta Posner, MD (Physician)                                                  Vascular and Vein Specialist of [place]   Patient name: [name] [name]         MRN: [id number]        DOB: 06/15/1950        Sex: male   REASON FOR VISIT: Follow-up left second stage basilic vein transposition on 02/10/2021   HPI: [name] [name] is a 70 y.o. male here today for follow-up.  Still using his right IJ tunneled catheter without difficulty.  He underwent second stage basilic vein transposition left arm on 02/11/2019         Current Outpatient Medications  Medication Sig Dispense Refill   albuterol (VENTOLIN HFA) 108 (90 Base) MCG/ACT inhaler INHALE 2 PUFFS BY MOUTH EVERY 4 HOURS AS NEEDED ONLY  IF  YOU  CANT  CATCH  YOUR  BREATH (Patient taking differently: INHALE 2 PUFFS BY MOUTH EVERY 4 HOURS AS NEEDED ONLY  IF  YOU  CANT  CATCH  YOUR  BREATH) 8 g 0   allopurinol (ZYLOPRIM) 300 MG tablet Take 300 mg by mouth in the morning.       amLODipine (NORVASC) 5 MG tablet Take 5 mg by mouth daily.       calcitRIOL (ROCALTROL) 0.25 MCG  capsule Take 0.5 mcg by mouth every Monday, Wednesday, and Friday with hemodialysis.       carvedilol (COREG) 12.5 MG tablet Take 12.5 mg by mouth 2 (two) times daily with a meal.       fluticasone furoate-vilanterol (BREO ELLIPTA) 100-25 MCG/INH AEPB Inhale 1 puff into the lungs daily as needed (respiratory issues).       furosemide (LASIX) 80 MG tablet Take 80 mg by mouth 2 (two) times daily.       hydrALAZINE (APRESOLINE) 50 MG tablet Take 50 mg by mouth in the morning and at bedtime. In the evening & at bedtime       insulin aspart (NOVOLOG FLEXPEN) 100 UNIT/ML FlexPen Inject 5-10 Units into the skin with breakfast, with lunch, and with evening meal. Sliding scale insulin       insulin detemir (LEVEMIR) 100 UNIT/ML injection Inject 5-10 Units into the skin at bedtime.       oxyCODONE-acetaminophen (PERCOCET) 5-325 MG tablet Take 1 tablet by mouth every 6 (six) hours as needed for severe pain. 8 tablet  0   sevelamer carbonate (RENVELA) 800 MG tablet Take 800 mg by mouth in the morning and at bedtime.        No current facility-administered medications for this visit.        PHYSICAL EXAM:    Vitals:    04/01/21 1449  BP: (!) 143/74  Pulse: 73  Resp: 18  Temp: 97.9 F (36.6 C)  TempSrc: Oral  SpO2: 99%  Weight: 234 lb (106.1 kg)  Height: 6' (1.829 m)      GENERAL: The patient is a well-nourished male, in no acute distress. The vital signs are documented above. Excellent healing of all surgical incisions in his left arm.  Excellent maturation of size with a very superficial running basilic vein fistula.   MEDICAL ISSUES: Doing well status post second stage basilic vein transposition fistula on 02/10/2021.  Okay to begin using this at any time.  I will communicate this to the dialysis staff in [place].  After he has had successful sessions with his fistula, I am available to remove his right IJ tunneled catheter as indicated     Rosetta Posner, MD FACS [facility] Specialists  of [place] Office Tel [phone]   Note: Portions of this report may have been transcribed using voice recognition software.  Every effort has been made to ensure accuracy; however, inadvertent computerized transcription errors may still be present.         Addendum:  The patient has been re-examined and re-evaluated.  The patient's history and physical has been reviewed and is unchanged.    [name] [name] is a 71 y.o. male is being admitted with ESRD. All the risks, benefits and other treatment options have been discussed with the patient. The patient has consented to proceed with Procedure(s): MINOR REMOVAL OF A TUNNELED DIALYSIS CATHETER as a surgical intervention.  [name] [name] 05/05/2021 1:09 PM Vascular and Vein Surgery

## 2021-05-05 NOTE — Op Note (Signed)
° ° °  OPERATIVE REPORT  DATE OF SURGERY: 05/05/2021  PATIENT: [name] [name], 71 y.o. male MRN: [id number]  DOB: 1950/09/17  PRE-OPERATIVE DIAGNOSIS: End-stage renal disease with functioning left arm AV fistula  POST-OPERATIVE DIAGNOSIS:  Same  PROCEDURE: Removal of right IJ tunneled hemodialysis cath  SURGEON:  Curt Jews, M.D.  PHYSICIAN ASSISTANT: Nurse  The assistant was needed for exposure and to expedite the case  ANESTHESIA: 1% lidocaine local  EBL: per anesthesia record  No intake/output data recorded.  BLOOD ADMINISTERED: none  DRAINS: none  SPECIMEN: none  COUNTS CORRECT:  YES  PATIENT DISPOSITION:  PACU - hemodynamically stable  PROCEDURE DETAILS: The patient was taken to the PACU holding area procedure room.  The right neck and chest and catheter were prepped and draped in usual sterile fashion.  Using local anesthesia incision was made over the level of the cuff on the catheter and sharp dissection was used to continue down to the felt cuff.  This was mobilized completely.  The catheter was removed in its entirety.  Pressure was held for hemostasis.  4-0 Monocryl suture was used to close the hole in the subcuticular fashion.  Sterile dressing was applied and the patient was discharged   Rosetta Posner, M.D., [facility] 05/05/2021 2:03 PM  Note: Portions of this report may have been transcribed using voice recognition software.  Every effort has been made to ensure accuracy; however, inadvertent computerized transcription errors may still be present.

## 2021-05-06 ENCOUNTER — Encounter (HOSPITAL_COMMUNITY): Payer: Self-pay | Admitting: Vascular Surgery

## 2021-05-14 ENCOUNTER — Other Ambulatory Visit: Payer: Self-pay

## 2021-05-14 ENCOUNTER — Inpatient Hospital Stay (HOSPITAL_COMMUNITY): Payer: PPO | Attending: Hematology

## 2021-05-14 DIAGNOSIS — D472 Monoclonal gammopathy: Secondary | ICD-10-CM | POA: Diagnosis not present

## 2021-05-14 LAB — CBC WITH DIFFERENTIAL/PLATELET
Abs Immature Granulocytes: 0.01 10*3/uL (ref 0.00–0.07)
Basophils Absolute: 0 10*3/uL (ref 0.0–0.1)
Basophils Relative: 1 %
Eosinophils Absolute: 0.2 10*3/uL (ref 0.0–0.5)
Eosinophils Relative: 4 %
HCT: 41.3 % (ref 39.0–52.0)
Hemoglobin: 13.1 g/dL (ref 13.0–17.0)
Immature Granulocytes: 0 %
Lymphocytes Relative: 22 %
Lymphs Abs: 1.4 10*3/uL (ref 0.7–4.0)
MCH: 29.2 pg (ref 26.0–34.0)
MCHC: 31.7 g/dL (ref 30.0–36.0)
MCV: 92 fL (ref 80.0–100.0)
Monocytes Absolute: 0.6 10*3/uL (ref 0.1–1.0)
Monocytes Relative: 9 %
Neutro Abs: 3.9 10*3/uL (ref 1.7–7.7)
Neutrophils Relative %: 64 %
Platelets: 212 10*3/uL (ref 150–400)
RBC: 4.49 MIL/uL (ref 4.22–5.81)
RDW: 14.1 % (ref 11.5–15.5)
WBC: 6.1 10*3/uL (ref 4.0–10.5)
nRBC: 0 % (ref 0.0–0.2)

## 2021-05-14 LAB — COMPREHENSIVE METABOLIC PANEL
ALT: 20 U/L (ref 0–44)
AST: 22 U/L (ref 15–41)
Albumin: 4 g/dL (ref 3.5–5.0)
Alkaline Phosphatase: 126 U/L (ref 38–126)
Anion gap: 18 — ABNORMAL HIGH (ref 5–15)
BUN: 95 mg/dL — ABNORMAL HIGH (ref 8–23)
CO2: 25 mmol/L (ref 22–32)
Calcium: 9.6 mg/dL (ref 8.9–10.3)
Chloride: 92 mmol/L — ABNORMAL LOW (ref 98–111)
Creatinine, Ser: 10.99 mg/dL — ABNORMAL HIGH (ref 0.61–1.24)
GFR, Estimated: 5 mL/min — ABNORMAL LOW (ref >60)
Glucose, Bld: 133 mg/dL — ABNORMAL HIGH (ref 70–99)
Potassium: 4.6 mmol/L (ref 3.5–5.1)
Sodium: 135 mmol/L (ref 135–145)
Total Bilirubin: 0.6 mg/dL (ref 0.3–1.2)
Total Protein: 8.3 g/dL — ABNORMAL HIGH (ref 6.5–8.1)

## 2021-05-14 LAB — LACTATE DEHYDROGENASE: LDH: 139 U/L (ref 98–192)

## 2021-05-15 LAB — KAPPA/LAMBDA LIGHT CHAINS
Kappa free light chain: 3755.5 mg/L — ABNORMAL HIGH (ref 3.3–19.4)
Kappa, lambda light chain ratio: 27.29 — ABNORMAL HIGH (ref 0.26–1.65)
Lambda free light chains: 137.6 mg/L — ABNORMAL HIGH (ref 5.7–26.3)

## 2021-05-15 LAB — PROTEIN ELECTROPHORESIS, SERUM
A/G Ratio: 1.2 (ref 0.7–1.7)
Albumin ELP: 4.1 g/dL (ref 2.9–4.4)
Alpha-1-Globulin: 0.3 g/dL (ref 0.0–0.4)
Alpha-2-Globulin: 0.8 g/dL (ref 0.4–1.0)
Beta Globulin: 1 g/dL (ref 0.7–1.3)
Gamma Globulin: 1.4 g/dL (ref 0.4–1.8)
Globulin, Total: 3.5 g/dL (ref 2.2–3.9)
Total Protein ELP: 7.6 g/dL (ref 6.0–8.5)

## 2021-05-16 LAB — BETA 2 MICROGLOBULIN, SERUM: Beta-2 Microglobulin: 23.4 mg/L — ABNORMAL HIGH (ref 0.6–2.4)

## 2021-05-19 DIAGNOSIS — Z992 Dependence on renal dialysis: Secondary | ICD-10-CM | POA: Diagnosis not present

## 2021-05-19 DIAGNOSIS — N186 End stage renal disease: Secondary | ICD-10-CM | POA: Diagnosis not present

## 2021-05-19 LAB — IMMUNOFIXATION ELECTROPHORESIS
IgA: 150 mg/dL (ref 61–437)
IgG (Immunoglobin G), Serum: 1308 mg/dL (ref 603–1613)
IgM (Immunoglobulin M), Srm: 47 mg/dL (ref 15–143)
Total Protein ELP: 7.4 g/dL (ref 6.0–8.5)

## 2021-05-20 DIAGNOSIS — D509 Iron deficiency anemia, unspecified: Secondary | ICD-10-CM | POA: Diagnosis not present

## 2021-05-20 DIAGNOSIS — Z992 Dependence on renal dialysis: Secondary | ICD-10-CM | POA: Diagnosis not present

## 2021-05-20 DIAGNOSIS — N186 End stage renal disease: Secondary | ICD-10-CM | POA: Diagnosis not present

## 2021-05-20 DIAGNOSIS — D631 Anemia in chronic kidney disease: Secondary | ICD-10-CM | POA: Diagnosis not present

## 2021-05-21 ENCOUNTER — Ambulatory Visit (HOSPITAL_COMMUNITY)
Admission: RE | Admit: 2021-05-21 | Discharge: 2021-05-21 | Disposition: A | Discharge status: Home / Self Care | Payer: PPO | Source: Ambulatory Visit | Attending: Hematology | Admitting: Hematology

## 2021-05-21 ENCOUNTER — Encounter (HOSPITAL_COMMUNITY): Payer: Self-pay | Admitting: Hematology

## 2021-05-21 ENCOUNTER — Inpatient Hospital Stay (HOSPITAL_COMMUNITY): Payer: PPO | Attending: Hematology | Admitting: Hematology

## 2021-05-21 ENCOUNTER — Other Ambulatory Visit: Payer: Self-pay

## 2021-05-21 VITALS — BP 142/82 | HR 88 | Temp 98.6°F | Resp 16 | Ht 70.47 in | Wt 235.5 lb

## 2021-05-21 DIAGNOSIS — Z992 Dependence on renal dialysis: Secondary | ICD-10-CM | POA: Diagnosis not present

## 2021-05-21 DIAGNOSIS — I509 Heart failure, unspecified: Secondary | ICD-10-CM | POA: Diagnosis not present

## 2021-05-21 DIAGNOSIS — D472 Monoclonal gammopathy: Secondary | ICD-10-CM | POA: Diagnosis not present

## 2021-05-21 DIAGNOSIS — N186 End stage renal disease: Secondary | ICD-10-CM | POA: Diagnosis not present

## 2021-05-21 DIAGNOSIS — E1122 Type 2 diabetes mellitus with diabetic chronic kidney disease: Secondary | ICD-10-CM | POA: Insufficient documentation

## 2021-05-21 NOTE — Patient Instructions (Signed)
[place] at [facility] Discharge Instructions  You were seen and examined today by Dr. Delton Coombes. He reviewed your most recent labs and everything looks good. Stop at radiology for skeletal survey today. Please keep follow up appointment as scheduled in 4 months.   Thank you for choosing [place] at [facility] to provide your oncology and hematology care.  To afford each patient quality time with our provider, please arrive at least 15 minutes before your scheduled appointment time.   If you have a lab appointment with the [facility] please come in thru the Main Entrance and check in at the main information desk.  You need to re-schedule your appointment should you arrive 10 or more minutes late.  We strive to give you quality time with our providers, and arriving late affects you and other patients whose appointments are after yours.  Also, if you no show three or more times for appointments you may be dismissed from the clinic at the providers discretion.     Again, thank you for choosing [facility].  Our hope is that these requests will decrease the amount of time that you wait before being seen by our physicians.       _____________________________________________________________  Should you have questions after your visit to [facility], please contact our office at [phone] and follow the prompts.  Our office hours are 8:00 a.m. and 4:30 p.m. Monday - Friday.  Please note that voicemails left after 4:00 p.m. may not be returned until the following business day.  We are closed weekends and major holidays.  You do have access to a nurse 24-7, just call the main number to the clinic [phone] and do not press any options, hold on the line and a nurse will answer the phone.    For prescription refill requests, have your pharmacy contact our office and allow 72 hours.    Due to Covid, you will need to wear  a mask upon entering the hospital. If you do not have a mask, a mask will be given to you at the Main Entrance upon arrival. For doctor visits, patients may have 1 support person age 59 or older with them. For treatment visits, patients can not have anyone with them due to social distancing guidelines and our immunocompromised population.

## 2021-05-21 NOTE — Progress Notes (Signed)
[address], [place] [address]   CLINIC:  Medical Oncology/Hematology  PCP:  Neale Burly, MD [place] / [place] [address]  [phone]  REASON FOR VISIT:  Follow-up for MGUS  PRIOR THERAPY: none  CURRENT THERAPY: surveillance  INTERVAL HISTORY:  [name], a 71 y.o. 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 [name] [name] was last seen on 01/13/2021.  Today he reports feeling well. He reports right sided neck pain, dry cough, and occasional CP with exertion. He reports SOB.   REVIEW OF SYSTEMS:  Review of Systems  Constitutional:  Negative for appetite change and fatigue.  Respiratory:  Positive for cough (dry) and shortness of breath.   Cardiovascular:  Positive for chest pain.  Musculoskeletal:  Positive for neck pain (R side).  All other systems reviewed and are negative.  PAST MEDICAL/SURGICAL HISTORY:  Past Medical History:  Diagnosis Date   Asthma    CHF (congestive heart failure) (HCC)    Depression    Diabetes mellitus without complication (HCC)    Dyspnea    Stage 4 chronic kidney disease (HCC)    Past Surgical History:  Procedure Laterality Date   ABCESS DRAINAGE     on back - possible spider bite   AV FISTULA PLACEMENT Left 12/23/2020   Procedure: LEFT ARM ARTERIOVENOUS (AV) FISTULA CREATION;  Surgeon: Rosetta Posner, MD;  Location: [facility];  Service: Vascular;  Laterality: Left;   [place] Left 02/10/2021   Procedure: LEFT ARM SECOND STAGE BASILIC VEIN TRANSPOSITION;  Surgeon: Rosetta Posner, MD;  Location: [facility];  Service: Vascular;  Laterality: Left;   COLONOSCOPY     INSERTION OF DIALYSIS CATHETER Right 12/23/2020   Procedure: INSERTION OF PALINDROME DIALYSIS CATHETER;  Surgeon: Rosetta Posner, MD;  Location: [facility];  Service: Vascular;  Laterality: Right;   REMOVAL OF A DIALYSIS CATHETER N/A 05/05/2021   Procedure: MINOR REMOVAL OF A TUNNELED DIALYSIS CATHETER;  Surgeon:  Rosetta Posner, MD;  Location: [facility];  Service: Vascular;  Laterality: N/A;    SOCIAL HISTORY:  Social History   Socioeconomic History   Marital status: Married    Spouse name: Not on file   Number of children: Not on file   Years of education: Not on file   Highest education level: Not on file  Occupational History   Not on file  Tobacco Use   Smoking status: Never   Smokeless tobacco: Never  Vaping Use   Vaping Use: Never used  Substance and Sexual Activity   Alcohol use: Not Currently   Drug use: Never   Sexual activity: Not Currently  Other Topics Concern   Not on file  Social History Narrative   Not on file   Social Determinants of Health   Financial Resource Strain: Low Risk    Difficulty of Paying Living Expenses: Not hard at all  Food Insecurity: No Food Insecurity   Worried About Estate manager/land agent of Food in the Last Year: Never true   Arboriculturist in the Last Year: Never true  Transportation Needs: No Transportation Needs   Lack of Transportation (Medical): No   Lack of Transportation (Non-Medical): No  Physical Activity: Inactive   Days of Exercise per Week: 0 days   Minutes of Exercise per Session: 0 min  Stress: No Stress Concern Present   Feeling of Stress : Not at all  Social Connections: Moderately Isolated  Frequency of Communication with Friends and Family: More than three times a week   Frequency of Social Gatherings with Friends and Family: More than three times a week   Attends Religious Services: Never   Marine scientist or Organizations: No   Attends Music therapist: Never   Marital Status: Married  Human resources officer Violence: Not At Risk   Fear of Current or Ex-Partner: No   Emotionally Abused: No   Physically Abused: No   Sexually Abused: No    FAMILY HISTORY:  Family History  Problem Relation Age of Onset   Breast cancer Mother        died 48   Emphysema Father        died 84   Diabetes Sister    Diabetes  Brother     CURRENT MEDICATIONS:  Current Outpatient Medications  Medication Sig Dispense Refill   allopurinol (ZYLOPRIM) 300 MG tablet Take 300 mg by mouth daily as needed (gout).     albuterol (VENTOLIN HFA) 108 (90 Base) MCG/ACT inhaler INHALE 2 PUFFS BY MOUTH EVERY 4 HOURS AS NEEDED ONLY  IF  YOU  CANT  CATCH  YOUR  BREATH (Patient not taking: Reported on 05/21/2021) 8 g 0   No current facility-administered medications for this visit.    ALLERGIES:  Not on File  PHYSICAL EXAM:  Performance status (ECOG): 1 - Symptomatic but completely ambulatory  Vitals:   05/21/21 1501  BP: (!) 142/82  Pulse: 88  Resp: 16  Temp: 98.6 F (37 C)  SpO2: 100%   Wt Readings from Last 3 Encounters:  05/21/21 235 lb 7.2 oz (106.8 kg)  04/01/21 234 lb (106.1 kg)  02/10/21 233 lb 11 oz (106 kg)   Physical Exam Vitals reviewed.  Constitutional:      Appearance: Normal appearance. He is obese.  Cardiovascular:     Rate and Rhythm: Normal rate and regular rhythm.     Pulses: Normal pulses.     Heart sounds: Normal heart sounds.  Pulmonary:     Effort: Pulmonary effort is normal.     Breath sounds: Normal breath sounds.  Chest:     Chest wall: No tenderness.  Neurological:     General: No focal deficit present.     Mental Status: He is alert and oriented to person, place, and time.  Psychiatric:        Mood and Affect: Mood normal.        Behavior: Behavior normal.    LABORATORY DATA:  I have reviewed the labs as listed.  CBC Latest Ref Rng & Units 05/14/2021 02/10/2021 01/13/2021  WBC 4.0 - 10.5 K/uL 6.1 - 6.7  Hemoglobin 13.0 - 17.0 g/dL 13.1 11.9(L) 9.7(L)  Hematocrit 39.0 - 52.0 % 41.3 35.0(L) 30.8(L)  Platelets 150 - 400 K/uL 212 - 245   CMP Latest Ref Rng & Units 05/14/2021 02/10/2021 01/13/2021  Glucose 70 - 99 mg/dL 133(H) 93 99  BUN 8 - 23 mg/dL 95(H) 77(H) 75(H)  Creatinine 0.61 - 1.24 mg/dL 10.99(H) 8.60(H) 6.86(H)  Sodium 135 - 145 mmol/L 135 136 135  Potassium 3.5 -  5.1 mmol/L 4.6 4.8 3.4(L)  Chloride 98 - 111 mmol/L 92(L) 98 96(L)  CO2 22 - 32 mmol/L 25 - 26  Calcium 8.9 - 10.3 mg/dL 9.6 - 8.8(L)  Total Protein 6.5 - 8.1 g/dL 8.3(H) - 7.0  Total Bilirubin 0.3 - 1.2 mg/dL 0.6 - 0.5  Alkaline Phos 38 - 126 U/L 126 -  88  AST 15 - 41 U/L 22 - 21  ALT 0 - 44 U/L 20 - 15      Component Value Date/Time   RBC 4.49 05/14/2021 1253   MCV 92.0 05/14/2021 1253   MCH 29.2 05/14/2021 1253   MCHC 31.7 05/14/2021 1253   RDW 14.1 05/14/2021 1253   LYMPHSABS 1.4 05/14/2021 1253   MONOABS 0.6 05/14/2021 1253   EOSABS 0.2 05/14/2021 1253   BASOSABS 0.0 05/14/2021 1253    DIAGNOSTIC IMAGING:  I have independently reviewed the scans and discussed with the patient. No results found.   ASSESSMENT:  MGUS: - Patient evaluated at the request of Dr. Theador Hawthorne for nephrotic range proteinuria. - Kappa light chains on 12/19/2020 was 1775, lambda light chains 87, ratio of 20.4.  SPEP negative for M spike. - Bone marrow biopsy on 12/30/2020-normocellular marrow with trilineage hematopoiesis.  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  [place] red staining is pending.  Crystal violet stain for amyloid deposition was negative. - Chromosome analysis 45, X, minus Y[5]/46, XY[15]  2.  Social/family history: - He worked as a Agricultural consultant prior to retirement.  No chemical exposure.  No smoking. - Mother died of breast cancer.   PLAN:  Kappa light chain MGUS: - He does not report any new onset bone pains.  Denies any B symptoms.  Denies any infections. - Reviewed labs from 05/14/2021 which showed normal CBC.  Calcium was normal.  SPEP and immunofixation were negative. - Free kappa light chains are elevated at 3755, up from 1775.  Light chain ratio has gone up to 27 from 20. - He reported some chest pains on exertion which lasted few minutes.  I have recommended follow-up with his PMD and if any worse go to the ER. - I have recommended skeletal survey  today. - RTC 4 months for follow-up with repeat myeloma panel.  2.  ESRD on HD: - Hemodialysis started around 12/26/2020.  Continue Monday/Wednesday/Friday.  Orders placed this encounter:  No orders of the defined types were placed in this encounter.    Derek Jack, MD [facility] [phone]   I, Thana Ates, am acting as a scribe for Dr. Derek Jack.  I, Derek Jack MD, have reviewed the above documentation for accuracy and completeness, and I agree with the above.

## 2021-05-25 ENCOUNTER — Observation Stay (HOSPITAL_COMMUNITY)
Admission: EM | Admit: 2021-05-25 | Discharge: 2021-05-27 | Disposition: A | Discharge status: Home / Self Care | Payer: PPO | Attending: Cardiovascular Disease | Admitting: Cardiovascular Disease

## 2021-05-25 ENCOUNTER — Encounter (HOSPITAL_COMMUNITY): Payer: Self-pay | Admitting: *Deleted

## 2021-05-25 ENCOUNTER — Emergency Department (HOSPITAL_COMMUNITY): Payer: PPO

## 2021-05-25 DIAGNOSIS — I2510 Atherosclerotic heart disease of native coronary artery without angina pectoris: Secondary | ICD-10-CM | POA: Diagnosis not present

## 2021-05-25 DIAGNOSIS — N186 End stage renal disease: Secondary | ICD-10-CM | POA: Diagnosis present

## 2021-05-25 DIAGNOSIS — D638 Anemia in other chronic diseases classified elsewhere: Secondary | ICD-10-CM

## 2021-05-25 DIAGNOSIS — I509 Heart failure, unspecified: Secondary | ICD-10-CM | POA: Insufficient documentation

## 2021-05-25 DIAGNOSIS — D631 Anemia in chronic kidney disease: Secondary | ICD-10-CM | POA: Diagnosis not present

## 2021-05-25 DIAGNOSIS — Z992 Dependence on renal dialysis: Secondary | ICD-10-CM | POA: Insufficient documentation

## 2021-05-25 DIAGNOSIS — E1122 Type 2 diabetes mellitus with diabetic chronic kidney disease: Secondary | ICD-10-CM | POA: Diagnosis not present

## 2021-05-25 DIAGNOSIS — Z79899 Other long term (current) drug therapy: Secondary | ICD-10-CM | POA: Insufficient documentation

## 2021-05-25 DIAGNOSIS — E669 Obesity, unspecified: Secondary | ICD-10-CM

## 2021-05-25 DIAGNOSIS — I214 Non-ST elevation (NSTEMI) myocardial infarction: Secondary | ICD-10-CM | POA: Diagnosis not present

## 2021-05-25 DIAGNOSIS — E876 Hypokalemia: Secondary | ICD-10-CM | POA: Insufficient documentation

## 2021-05-25 DIAGNOSIS — Z955 Presence of coronary angioplasty implant and graft: Secondary | ICD-10-CM

## 2021-05-25 DIAGNOSIS — J45909 Unspecified asthma, uncomplicated: Secondary | ICD-10-CM | POA: Diagnosis not present

## 2021-05-25 DIAGNOSIS — R072 Precordial pain: Secondary | ICD-10-CM

## 2021-05-25 DIAGNOSIS — R079 Chest pain, unspecified: Secondary | ICD-10-CM | POA: Diagnosis present

## 2021-05-25 DIAGNOSIS — Z20822 Contact with and (suspected) exposure to covid-19: Secondary | ICD-10-CM | POA: Diagnosis not present

## 2021-05-25 DIAGNOSIS — E119 Type 2 diabetes mellitus without complications: Secondary | ICD-10-CM

## 2021-05-25 DIAGNOSIS — R0789 Other chest pain: Secondary | ICD-10-CM | POA: Diagnosis not present

## 2021-05-25 DIAGNOSIS — R609 Edema, unspecified: Secondary | ICD-10-CM

## 2021-05-25 DIAGNOSIS — E1169 Type 2 diabetes mellitus with other specified complication: Secondary | ICD-10-CM | POA: Insufficient documentation

## 2021-05-25 DIAGNOSIS — R7989 Other specified abnormal findings of blood chemistry: Secondary | ICD-10-CM | POA: Diagnosis present

## 2021-05-25 DIAGNOSIS — R778 Other specified abnormalities of plasma proteins: Secondary | ICD-10-CM | POA: Diagnosis present

## 2021-05-25 LAB — BASIC METABOLIC PANEL
Anion gap: 14 (ref 5–15)
BUN: 51 mg/dL — ABNORMAL HIGH (ref 8–23)
CO2: 27 mmol/L (ref 22–32)
Calcium: 8.6 mg/dL — ABNORMAL LOW (ref 8.9–10.3)
Chloride: 91 mmol/L — ABNORMAL LOW (ref 98–111)
Creatinine, Ser: 8.06 mg/dL — ABNORMAL HIGH (ref 0.61–1.24)
GFR, Estimated: 7 mL/min — ABNORMAL LOW (ref >60)
Glucose, Bld: 171 mg/dL — ABNORMAL HIGH (ref 70–99)
Potassium: 3 mmol/L — ABNORMAL LOW (ref 3.5–5.1)
Sodium: 132 mmol/L — ABNORMAL LOW (ref 135–145)

## 2021-05-25 LAB — CBC
HCT: 35 % — ABNORMAL LOW (ref 39.0–52.0)
Hemoglobin: 11.2 g/dL — ABNORMAL LOW (ref 13.0–17.0)
MCH: 29.2 pg (ref 26.0–34.0)
MCHC: 32 g/dL (ref 30.0–36.0)
MCV: 91.1 fL (ref 80.0–100.0)
Platelets: 231 10*3/uL (ref 150–400)
RBC: 3.84 MIL/uL — ABNORMAL LOW (ref 4.22–5.81)
RDW: 14.3 % (ref 11.5–15.5)
WBC: 6.5 10*3/uL (ref 4.0–10.5)
nRBC: 0 % (ref 0.0–0.2)

## 2021-05-25 LAB — TROPONIN I (HIGH SENSITIVITY)
Troponin I (High Sensitivity): 120 ng/L (ref <18)
Troponin I (High Sensitivity): 179 ng/L (ref <18)

## 2021-05-25 LAB — RESP PANEL BY RT-PCR (FLU A&B, COVID) ARPGX2
Influenza A by PCR: NEGATIVE
Influenza B by PCR: NEGATIVE
SARS Coronavirus 2 by RT PCR: NEGATIVE

## 2021-05-25 NOTE — ED Notes (Signed)
Date and time results received: 05/25/21 4:36 PM   Test: troponin Critical Value: 120  Name of Provider Notified: Dr. Laverta Baltimore  Orders Received? Or Actions Taken?: See orders

## 2021-05-25 NOTE — ED Triage Notes (Signed)
States he started having chest pain after dialysis today

## 2021-05-25 NOTE — ED Provider Notes (Signed)
Emergency Department Provider Note   I have reviewed the triage vital signs and the nursing notes.   HISTORY  Chief Complaint Chest Pain   HPI [name] [name] is a 71 y.o. male with past medical history reviewed below including ESRD presents emergency department with chest discomfort on the left.  Symptoms been ongoing intermittently for the past several days.  He states initially was having pain with some cough which is since resolved but now is having chest tightness mainly when he lies flat.  He states that last night he had to get up several times and sit on the edge of the bed because of discomfort.  He denies any burning quality to the pain.  No change with eating.  No nausea, vomiting, diaphoresis.  He went to dialysis today, as scheduled, and had a full run of dialysis.  He returned home and lay down but began to have pain and so drove to the emergency department.  He states once he was upright and driving the pain had resolved and he is not currently having discomfort.  No shortness of breath. No similar pain in the past.    Past Medical History:  Diagnosis Date   Asthma    CHF (congestive heart failure) (HCC)    Depression    Diabetes mellitus without complication (HCC)    Dyspnea    Stage 4 chronic kidney disease ([place])     Review of Systems  Constitutional: No fever/chills Eyes: No visual changes. ENT: No sore throat. Cardiovascular: Positive chest pain. Respiratory: Denies shortness of breath. Gastrointestinal: No abdominal pain.  No nausea, no vomiting.  No diarrhea.  No constipation. Genitourinary: Negative for dysuria. Musculoskeletal: Negative for back pain. Skin: Negative for rash. Neurological: Negative for headaches, focal weakness or numbness.   ____________________________________________   PHYSICAL EXAM:  VITAL SIGNS: ED Triage Vitals  Enc Vitals Group     BP 05/25/21 1459 (!) 157/76     Pulse Rate 05/25/21 1459 (!) 101     Resp 05/25/21 1459  20     Temp 05/25/21 1459 97.9 F (36.6 C)     Temp Source 05/25/21 1459 Oral     SpO2 05/25/21 1459 100 %   Constitutional: Alert and oriented. Well appearing and in no acute distress. Eyes: Conjunctivae are normal.  Head: Atraumatic. Nose: No congestion/rhinnorhea. Mouth/Throat: Mucous membranes are moist.  Neck: No stridor.   Cardiovascular: Normal rate, regular rhythm. Good peripheral circulation. Grossly normal heart sounds.   Respiratory: Normal respiratory effort.  No retractions. Lungs CTAB. Gastrointestinal: Soft and nontender. No distention.  Musculoskeletal: No lower extremity tenderness nor edema. No gross deformities of extremities. Neurologic:  Normal speech and language. No gross focal neurologic deficits are appreciated.  Skin:  Skin is warm, dry and intact. No rash noted.  ____________________________________________   LABS (all labs ordered are listed, but only abnormal results are displayed)  Labs Reviewed  BASIC METABOLIC PANEL - Abnormal; Notable for the following components:      Result Value   Sodium 132 (*)    Potassium 3.0 (*)    Chloride 91 (*)    Glucose, Bld 171 (*)    BUN 51 (*)    Creatinine, Ser 8.06 (*)    Calcium 8.6 (*)    GFR, Estimated 7 (*)    All other components within normal limits  CBC - Abnormal; Notable for the following components:   RBC 3.84 (*)    Hemoglobin 11.2 (*)    HCT  35.0 (*)    All other components within normal limits  HEMOGLOBIN A1C - Abnormal; Notable for the following components:   Hgb A1c MFr Bld 6.7 (*)    All other components within normal limits  CBC - Abnormal; Notable for the following components:   RBC 3.88 (*)    Hemoglobin 11.4 (*)    HCT 36.6 (*)    All other components within normal limits  CBC - Abnormal; Notable for the following components:   RBC 3.49 (*)    Hemoglobin 10.2 (*)    HCT 32.6 (*)    All other components within normal limits  GLUCOSE, CAPILLARY - Abnormal; Notable for the  following components:   Glucose-Capillary 130 (*)    All other components within normal limits  BASIC METABOLIC PANEL - Abnormal; Notable for the following components:   Chloride 92 (*)    Glucose, Bld 110 (*)    BUN 59 (*)    Creatinine, Ser 10.13 (*)    Calcium 8.8 (*)    GFR, Estimated 5 (*)    Anion gap 20 (*)    All other components within normal limits  GLUCOSE, CAPILLARY - Abnormal; Notable for the following components:   Glucose-Capillary 102 (*)    All other components within normal limits  LIPID PANEL - Abnormal; Notable for the following components:   HDL 36 (*)    All other components within normal limits  RENAL FUNCTION PANEL - Abnormal; Notable for the following components:   Sodium 134 (*)    Chloride 92 (*)    Glucose, Bld 121 (*)    BUN 64 (*)    Creatinine, Ser 11.26 (*)    Calcium 8.5 (*)    Phosphorus 11.2 (*)    Albumin 2.9 (*)    GFR, Estimated 4 (*)    Anion gap 17 (*)    All other components within normal limits  CBC - Abnormal; Notable for the following components:   RBC 3.44 (*)    Hemoglobin 10.0 (*)    HCT 30.6 (*)    All other components within normal limits  CBC - Abnormal; Notable for the following components:   RBC 3.90 (*)    Hemoglobin 11.1 (*)    HCT 34.8 (*)    All other components within normal limits  CREATININE, SERUM - Abnormal; Notable for the following components:   Creatinine, Ser 10.98 (*)    GFR, Estimated 5 (*)    All other components within normal limits  HEPATITIS B SURFACE ANTIBODY, QUANTITATIVE - Abnormal; Notable for the following components:   Hepatitis B-Post <3.1 (*)    All other components within normal limits  GLUCOSE, CAPILLARY - Abnormal; Notable for the following components:   Glucose-Capillary 68 (*)    All other components within normal limits  CBG MONITORING, ED - Abnormal; Notable for the following components:   Glucose-Capillary 106 (*)    All other components within normal limits  TROPONIN I (HIGH  SENSITIVITY) - Abnormal; Notable for the following components:   Troponin I (High Sensitivity) 120 (*)    All other components within normal limits  TROPONIN I (HIGH SENSITIVITY) - Abnormal; Notable for the following components:   Troponin I (High Sensitivity) 179 (*)    All other components within normal limits  TROPONIN I (HIGH SENSITIVITY) - Abnormal; Notable for the following components:   Troponin I (High Sensitivity) 837 (*)    All other components within normal limits  TROPONIN I (HIGH  SENSITIVITY) - Abnormal; Notable for the following components:   Troponin I (High Sensitivity) 886 (*)    All other components within normal limits  TROPONIN I (HIGH SENSITIVITY) - Abnormal; Notable for the following components:   Troponin I (High Sensitivity) 918 (*)    All other components within normal limits  RESP PANEL BY RT-PCR (FLU A&B, COVID) ARPGX2  HEPARIN LEVEL (UNFRACTIONATED)  GLUCOSE, CAPILLARY  GLUCOSE, CAPILLARY  HEPATITIS B SURFACE ANTIGEN  HEPATITIS B SURFACE ANTIBODY,QUALITATIVE  POCT ACTIVATED CLOTTING TIME  POCT ACTIVATED CLOTTING TIME  POCT ACTIVATED CLOTTING TIME  POCT ACTIVATED CLOTTING TIME  POCT ACTIVATED CLOTTING TIME  POCT ACTIVATED CLOTTING TIME   ____________________________________________  EKG   EKG Interpretation  Date/Time:  Monday May 25 2021 15:02:38 EST Ventricular Rate:  100 PR Interval:  194 QRS Duration: 116 QT Interval:  380 QTC Calculation: 490 R Axis:   92 Text Interpretation: Sinus rhythm with occasional Premature ventricular complexes Possible Lateral infarct , age undetermined Abnormal ECG No previous ECGs available Confirmed by Nanda Quinton [phone]) on 05/25/2021 3:49:31 PM        ____________________________________________  RADIOLOGY  DG Chest 2 View  Result Date: 05/25/2021 CLINICAL DATA:  Right chest pain after dialysis today. The patient has been having right chest pain for the past 3 days. EXAM: CHEST - 2 VIEW COMPARISON:   12/23/2020 FINDINGS: Normal sized heart. Mildly tortuous and calcified thoracic aorta. Clear lungs. Mild peribronchial thickening with improvement. No acute bony abnormality. IMPRESSION: Mild bronchitic changes with improvement. Electronically Signed   By: Claudie Revering M.D.   On: 05/25/2021 15:21    ____________________________________________   PROCEDURES  Procedure(s) performed:   Procedures  CRITICAL CARE Performed by: Margette Fast Total critical care time: 35 minutes Critical care time was exclusive of separately billable procedures and treating other patients. Critical care was necessary to treat or prevent imminent or life-threatening deterioration. Critical care was time spent personally by me on the following activities: development of treatment plan with patient and/or surrogate as well as nursing, discussions with consultants, evaluation of patient's response to treatment, examination of patient, obtaining history from patient or surrogate, ordering and performing treatments and interventions, ordering and review of laboratory studies, ordering and review of radiographic studies, pulse oximetry and re-evaluation of patient's condition.  Nanda Quinton, MD Emergency Medicine  ____________________________________________   INITIAL IMPRESSION / ASSESSMENT AND PLAN / ED COURSE  Pertinent labs & imaging results that were available during my care of the patient were reviewed by me and considered in my medical decision making (see chart for details).   This patient is Presenting for Evaluation of CP, which does require a range of treatment options, and is a complaint that involves a high risk of morbidity and mortality.  The Differential Diagnoses includes all life-threatening causes for chest pain. This includes but is not exclusive to acute coronary syndrome, aortic dissection, pulmonary embolism, cardiac tamponade, community-acquired pneumonia, pericarditis, musculoskeletal chest  wall pain, etc.   I decided to review pertinent External Data, and in summary ECHO from 12/2020 (EF 35-40%).   Clinical Laboratory Tests Ordered, included Troponin mildly elevated. COVID and Flu are negative. Creatinine at baseline.   Radiologic Tests Ordered, included DVT US and CXR. I independently interpreted the images and agree with radiology interpretation.   Cardiac Monitor Tracing which shows NSR   Social Determinants of Health Risk patient is a non-smoker  Consult complete with hospitalist and Cardiology.  Discussed patient's case with [facility] to request admission.  Patient and family (if present) updated with plan. Care transferred to [facility] service.  I reviewed all nursing notes, vitals, pertinent old records, EKGs, labs, imaging (as available).   Medical Decision Making: Summary:  Patient presents to the emergency department for evaluation of chest discomfort.  Troponin slightly elevated here.  Discussed the case with cardiology.  Continue to trend troponins and plan for cardiology to evaluate in the morning.  EKG discussed and reviewed.  No indication for emergent transfer for left heart cath.  Plan for admit.   Reevaluation with update and discussion with patient who is in agreement with plan for admit.   Disposition: admit  ____________________________________________  FINAL CLINICAL IMPRESSION(S) / ED DIAGNOSES  Final diagnoses:  Precordial chest pain     NEW OUTPATIENT MEDICATIONS STARTED DURING THIS VISIT:  Discharge Medication List as of 05/27/2021  3:12 PM     START taking these medications   Details  aspirin 81 MG chewable tablet Chew 1 tablet (81 mg total) by mouth daily., Starting Wed 05/27/2021, Normal    atorvastatin (LIPITOR) 80 MG tablet Take 1 tablet (80 mg total) by mouth daily., Starting Wed 05/27/2021, Normal    carvedilol (COREG) 3.125 MG tablet Take 1 tablet (3.125 mg total) by mouth 2 (two) times daily with a meal., Starting Wed 05/27/2021, Normal     nitroGLYCERIN (NITROSTAT) 0.4 MG SL tablet Place 1 tablet (0.4 mg total) under the tongue every 5 (five) minutes as needed for chest pain., Starting Wed 05/27/2021, Normal    pantoprazole (PROTONIX) 40 MG tablet Take 1 tablet (40 mg total) by mouth daily., Starting Wed 05/27/2021, Normal    ticagrelor (BRILINTA) 90 MG TABS tablet Take 1 tablet (90 mg total) by mouth 2 (two) times daily., Starting Wed 05/27/2021, Normal        Note:  This document was prepared using Dragon voice recognition software and may include unintentional dictation errors.  Nanda Quinton, MD, [facility] Emergency Medicine    Silver Achey, [facility], MD 06/02/21 [phone]

## 2021-05-26 ENCOUNTER — Ambulatory Visit (HOSPITAL_COMMUNITY)
Admission: EM | Disposition: A | Discharge status: Home / Self Care | Payer: Self-pay | Source: Home / Self Care | Attending: Emergency Medicine

## 2021-05-26 ENCOUNTER — Observation Stay (HOSPITAL_COMMUNITY): Payer: PPO

## 2021-05-26 ENCOUNTER — Other Ambulatory Visit (HOSPITAL_COMMUNITY): Payer: PPO

## 2021-05-26 ENCOUNTER — Other Ambulatory Visit (HOSPITAL_COMMUNITY): Payer: Self-pay | Admitting: *Deleted

## 2021-05-26 ENCOUNTER — Other Ambulatory Visit: Payer: Self-pay

## 2021-05-26 DIAGNOSIS — R7989 Other specified abnormal findings of blood chemistry: Secondary | ICD-10-CM | POA: Diagnosis present

## 2021-05-26 DIAGNOSIS — J45909 Unspecified asthma, uncomplicated: Secondary | ICD-10-CM | POA: Diagnosis not present

## 2021-05-26 DIAGNOSIS — I428 Other cardiomyopathies: Secondary | ICD-10-CM | POA: Diagnosis not present

## 2021-05-26 DIAGNOSIS — E119 Type 2 diabetes mellitus without complications: Secondary | ICD-10-CM | POA: Diagnosis not present

## 2021-05-26 DIAGNOSIS — N186 End stage renal disease: Secondary | ICD-10-CM | POA: Diagnosis not present

## 2021-05-26 DIAGNOSIS — E1122 Type 2 diabetes mellitus with diabetic chronic kidney disease: Secondary | ICD-10-CM | POA: Diagnosis not present

## 2021-05-26 DIAGNOSIS — E1169 Type 2 diabetes mellitus with other specified complication: Secondary | ICD-10-CM | POA: Insufficient documentation

## 2021-05-26 DIAGNOSIS — Z20822 Contact with and (suspected) exposure to covid-19: Secondary | ICD-10-CM | POA: Diagnosis not present

## 2021-05-26 DIAGNOSIS — N250 Renal osteodystrophy: Secondary | ICD-10-CM | POA: Diagnosis not present

## 2021-05-26 DIAGNOSIS — D631 Anemia in chronic kidney disease: Secondary | ICD-10-CM | POA: Diagnosis not present

## 2021-05-26 DIAGNOSIS — I120 Hypertensive chronic kidney disease with stage 5 chronic kidney disease or end stage renal disease: Secondary | ICD-10-CM | POA: Diagnosis not present

## 2021-05-26 DIAGNOSIS — R079 Chest pain, unspecified: Secondary | ICD-10-CM | POA: Diagnosis not present

## 2021-05-26 DIAGNOSIS — I509 Heart failure, unspecified: Secondary | ICD-10-CM | POA: Diagnosis not present

## 2021-05-26 DIAGNOSIS — Z79899 Other long term (current) drug therapy: Secondary | ICD-10-CM | POA: Diagnosis not present

## 2021-05-26 DIAGNOSIS — E8779 Other fluid overload: Secondary | ICD-10-CM | POA: Diagnosis not present

## 2021-05-26 DIAGNOSIS — I214 Non-ST elevation (NSTEMI) myocardial infarction: Secondary | ICD-10-CM | POA: Diagnosis not present

## 2021-05-26 DIAGNOSIS — E876 Hypokalemia: Secondary | ICD-10-CM | POA: Diagnosis not present

## 2021-05-26 DIAGNOSIS — R778 Other specified abnormalities of plasma proteins: Secondary | ICD-10-CM | POA: Diagnosis not present

## 2021-05-26 DIAGNOSIS — Z992 Dependence on renal dialysis: Secondary | ICD-10-CM

## 2021-05-26 DIAGNOSIS — R600 Localized edema: Secondary | ICD-10-CM | POA: Diagnosis not present

## 2021-05-26 DIAGNOSIS — I2510 Atherosclerotic heart disease of native coronary artery without angina pectoris: Secondary | ICD-10-CM

## 2021-05-26 DIAGNOSIS — E669 Obesity, unspecified: Secondary | ICD-10-CM | POA: Insufficient documentation

## 2021-05-26 HISTORY — PX: CORONARY STENT INTERVENTION: CATH118234

## 2021-05-26 HISTORY — PX: LEFT HEART CATH AND CORONARY ANGIOGRAPHY: CATH118249

## 2021-05-26 LAB — CBC
HCT: 32.6 % — ABNORMAL LOW (ref 39.0–52.0)
HCT: 36.6 % — ABNORMAL LOW (ref 39.0–52.0)
HCT: 34.8 % — ABNORMAL LOW (ref 39.0–52.0)
Hemoglobin: 10.2 g/dL — ABNORMAL LOW (ref 13.0–17.0)
Hemoglobin: 11.4 g/dL — ABNORMAL LOW (ref 13.0–17.0)
Hemoglobin: 11.1 g/dL — ABNORMAL LOW (ref 13.0–17.0)
MCH: 29.2 pg (ref 26.0–34.0)
MCH: 29.4 pg (ref 26.0–34.0)
MCH: 28.5 pg (ref 26.0–34.0)
MCHC: 31.3 g/dL (ref 30.0–36.0)
MCHC: 31.1 g/dL (ref 30.0–36.0)
MCHC: 31.9 g/dL (ref 30.0–36.0)
MCV: 93.4 fL (ref 80.0–100.0)
MCV: 94.3 fL (ref 80.0–100.0)
MCV: 89.2 fL (ref 80.0–100.0)
Platelets: 207 10*3/uL (ref 150–400)
Platelets: 166 10*3/uL (ref 150–400)
Platelets: 188 10*3/uL (ref 150–400)
RBC: 3.49 MIL/uL — ABNORMAL LOW (ref 4.22–5.81)
RBC: 3.88 MIL/uL — ABNORMAL LOW (ref 4.22–5.81)
RBC: 3.9 MIL/uL — ABNORMAL LOW (ref 4.22–5.81)
RDW: 14.3 % (ref 11.5–15.5)
RDW: 14.3 % (ref 11.5–15.5)
RDW: 14.1 % (ref 11.5–15.5)
WBC: 6.3 10*3/uL (ref 4.0–10.5)
WBC: 5.4 10*3/uL (ref 4.0–10.5)
WBC: 6 10*3/uL (ref 4.0–10.5)
nRBC: 0 % (ref 0.0–0.2)
nRBC: 0 % (ref 0.0–0.2)
nRBC: 0 % (ref 0.0–0.2)

## 2021-05-26 LAB — BASIC METABOLIC PANEL
Anion gap: 20 — ABNORMAL HIGH (ref 5–15)
BUN: 59 mg/dL — ABNORMAL HIGH (ref 8–23)
CO2: 23 mmol/L (ref 22–32)
Calcium: 8.8 mg/dL — ABNORMAL LOW (ref 8.9–10.3)
Chloride: 92 mmol/L — ABNORMAL LOW (ref 98–111)
Creatinine, Ser: 10.13 mg/dL — ABNORMAL HIGH (ref 0.61–1.24)
GFR, Estimated: 5 mL/min — ABNORMAL LOW (ref >60)
Glucose, Bld: 110 mg/dL — ABNORMAL HIGH (ref 70–99)
Potassium: 3.7 mmol/L (ref 3.5–5.1)
Sodium: 135 mmol/L (ref 135–145)

## 2021-05-26 LAB — TROPONIN I (HIGH SENSITIVITY)
Troponin I (High Sensitivity): 837 ng/L (ref <18)
Troponin I (High Sensitivity): 886 ng/L (ref <18)
Troponin I (High Sensitivity): 918 ng/L (ref <18)

## 2021-05-26 LAB — GLUCOSE, CAPILLARY
Glucose-Capillary: 130 mg/dL — ABNORMAL HIGH (ref 70–99)
Glucose-Capillary: 102 mg/dL — ABNORMAL HIGH (ref 70–99)
Glucose-Capillary: 72 mg/dL (ref 70–99)
Glucose-Capillary: 85 mg/dL (ref 70–99)

## 2021-05-26 LAB — POCT ACTIVATED CLOTTING TIME
Activated Clotting Time: 215 seconds
Activated Clotting Time: 179 seconds

## 2021-05-26 LAB — CBG MONITORING, ED: Glucose-Capillary: 106 mg/dL — ABNORMAL HIGH (ref 70–99)

## 2021-05-26 LAB — CREATININE, SERUM
Creatinine, Ser: 10.98 mg/dL — ABNORMAL HIGH (ref 0.61–1.24)
GFR, Estimated: 5 mL/min — ABNORMAL LOW (ref >60)

## 2021-05-26 LAB — HEMOGLOBIN A1C
Hgb A1c MFr Bld: 6.7 % — ABNORMAL HIGH (ref 4.8–5.6)
Mean Plasma Glucose: 145.59 mg/dL

## 2021-05-26 LAB — HEPARIN LEVEL (UNFRACTIONATED): Heparin Unfractionated: 0.43 IU/mL (ref 0.30–0.70)

## 2021-05-26 SURGERY — LEFT HEART CATH AND CORONARY ANGIOGRAPHY
Anesthesia: LOCAL

## 2021-05-26 MED ORDER — NITROGLYCERIN 0.4 MG SL SUBL: 0.4000 mg | SUBLINGUAL_TABLET | SUBLINGUAL | Status: DC | PRN

## 2021-05-26 MED ORDER — ATORVASTATIN CALCIUM 80 MG PO TABS
80.0000 mg | ORAL_TABLET | Freq: Every day | ORAL | Status: DC
  Administered 2021-05-27: 80 mg via ORAL

## 2021-05-26 MED ORDER — TICAGRELOR 90 MG PO TABS
ORAL_TABLET | ORAL | Status: DC | PRN
  Administered 2021-05-26: 180 mg via ORAL

## 2021-05-26 MED ORDER — ONDANSETRON HCL 4 MG/2ML IJ SOLN
4.0000 mg | Freq: Four times a day (QID) | INTRAMUSCULAR | Status: DC | PRN
  Administered 2021-05-26: 4 mg via INTRAVENOUS
  Filled 2021-05-26: qty 2

## 2021-05-26 MED ORDER — NITROGLYCERIN 1 MG/10 ML FOR IR/CATH LAB
INTRA_ARTERIAL | Status: DC | PRN
  Administered 2021-05-26: 200 ug via INTRACORONARY
  Administered 2021-05-26 (×2): 100 ug via INTRACORONARY

## 2021-05-26 MED ORDER — PNEUMOCOCCAL VAC POLYVALENT 25 MCG/0.5ML IJ INJ
0.5000 mL | INJECTION | INTRAMUSCULAR | Status: DC
  Filled 2021-05-26: qty 0.5

## 2021-05-26 MED ORDER — SODIUM CHLORIDE 0.9 % IV SOLN: 250.0000 mL | INTRAVENOUS | Status: DC | PRN

## 2021-05-26 MED ORDER — TICAGRELOR 90 MG PO TABS
ORAL_TABLET | ORAL | Status: AC
  Filled 2021-05-26: qty 2

## 2021-05-26 MED ORDER — ACETAMINOPHEN 325 MG PO TABS: 650.0000 mg | ORAL_TABLET | ORAL | Status: DC | PRN

## 2021-05-26 MED ORDER — HEPARIN (PORCINE) 25000 UT/250ML-% IV SOLN
1400.0000 [IU]/h | INTRAVENOUS | Status: DC
  Administered 2021-05-26: 1400 [IU]/h via INTRAVENOUS
  Filled 2021-05-26: qty 250

## 2021-05-26 MED ORDER — HEPARIN SODIUM (PORCINE) 1000 UNIT/ML IJ SOLN
INTRAMUSCULAR | Status: AC
  Filled 2021-05-26: qty 10

## 2021-05-26 MED ORDER — PANTOPRAZOLE SODIUM 40 MG PO TBEC
40.0000 mg | DELAYED_RELEASE_TABLET | Freq: Every day | ORAL | Status: DC
  Administered 2021-05-26 – 2021-05-27 (×2): 40 mg via ORAL
  Filled 2021-05-26 (×2): qty 1

## 2021-05-26 MED ORDER — HEPARIN SODIUM (PORCINE) 5000 UNIT/ML IJ SOLN
5000.0000 [IU] | Freq: Three times a day (TID) | INTRAMUSCULAR | Status: DC
  Administered 2021-05-26: 5000 [IU] via SUBCUTANEOUS
  Filled 2021-05-26: qty 1

## 2021-05-26 MED ORDER — HEPARIN (PORCINE) IN NACL 1000-0.9 UT/500ML-% IV SOLN
INTRAVENOUS | Status: AC
  Filled 2021-05-26: qty 1000

## 2021-05-26 MED ORDER — CARVEDILOL 3.125 MG PO TABS: 3.1250 mg | ORAL_TABLET | Freq: Two times a day (BID) | ORAL | Status: DC

## 2021-05-26 MED ORDER — HEPARIN SODIUM (PORCINE) 1000 UNIT/ML IJ SOLN
INTRAMUSCULAR | Status: DC | PRN
  Administered 2021-05-26: 3000 [IU] via INTRAVENOUS
  Administered 2021-05-26: 10000 [IU] via INTRAVENOUS

## 2021-05-26 MED ORDER — FENTANYL CITRATE (PF) 100 MCG/2ML IJ SOLN
INTRAMUSCULAR | Status: AC
  Filled 2021-05-26: qty 2

## 2021-05-26 MED ORDER — TICAGRELOR 90 MG PO TABS
90.0000 mg | ORAL_TABLET | Freq: Two times a day (BID) | ORAL | Status: DC
  Administered 2021-05-26 – 2021-05-27 (×2): 90 mg via ORAL
  Filled 2021-05-26 (×2): qty 1

## 2021-05-26 MED ORDER — MIDAZOLAM HCL 2 MG/2ML IJ SOLN
INTRAMUSCULAR | Status: AC
  Filled 2021-05-26: qty 2

## 2021-05-26 MED ORDER — SEVELAMER CARBONATE 800 MG PO TABS
800.0000 mg | ORAL_TABLET | Freq: Three times a day (TID) | ORAL | Status: DC
  Filled 2021-05-26: qty 1

## 2021-05-26 MED ORDER — ASPIRIN EC 81 MG PO TBEC
81.0000 mg | DELAYED_RELEASE_TABLET | Freq: Every day | ORAL | Status: DC
  Administered 2021-05-26: 81 mg via ORAL
  Filled 2021-05-26: qty 1

## 2021-05-26 MED ORDER — LIDOCAINE HCL (PF) 1 % IJ SOLN
INTRAMUSCULAR | Status: AC
  Filled 2021-05-26: qty 30

## 2021-05-26 MED ORDER — ENOXAPARIN SODIUM 30 MG/0.3ML IJ SOSY: 30.0000 mg | PREFILLED_SYRINGE | INTRAMUSCULAR | Status: DC

## 2021-05-26 MED ORDER — SODIUM CHLORIDE 0.9% FLUSH: 3.0000 mL | INTRAVENOUS | Status: DC | PRN

## 2021-05-26 MED ORDER — LABETALOL HCL 5 MG/ML IV SOLN: 10.0000 mg | INTRAVENOUS | Status: AC | PRN

## 2021-05-26 MED ORDER — SODIUM CHLORIDE 0.9% FLUSH: 3.0000 mL | Freq: Two times a day (BID) | INTRAVENOUS | Status: DC

## 2021-05-26 MED ORDER — INSULIN ASPART 100 UNIT/ML IJ SOLN: 0.0000 [IU] | Freq: Three times a day (TID) | INTRAMUSCULAR | Status: DC

## 2021-05-26 MED ORDER — HYDRALAZINE HCL 20 MG/ML IJ SOLN: 10.0000 mg | INTRAMUSCULAR | Status: AC | PRN

## 2021-05-26 MED ORDER — LIDOCAINE HCL (PF) 1 % IJ SOLN
INTRAMUSCULAR | Status: DC | PRN
  Administered 2021-05-26: 15 mL via INTRADERMAL

## 2021-05-26 MED ORDER — FENTANYL CITRATE (PF) 100 MCG/2ML IJ SOLN
INTRAMUSCULAR | Status: DC | PRN
  Administered 2021-05-26 (×2): 25 ug via INTRAVENOUS

## 2021-05-26 MED ORDER — VERAPAMIL HCL 2.5 MG/ML IV SOLN
INTRAVENOUS | Status: AC
  Filled 2021-05-26: qty 2

## 2021-05-26 MED ORDER — HEPARIN BOLUS VIA INFUSION
2000.0000 [IU] | Freq: Once | INTRAVENOUS | Status: AC
  Administered 2021-05-26: 2000 [IU] via INTRAVENOUS

## 2021-05-26 MED ORDER — SODIUM CHLORIDE 0.9 % IV SOLN: INTRAVENOUS | Status: DC

## 2021-05-26 MED ORDER — INSULIN ASPART 100 UNIT/ML IJ SOLN: 0.0000 [IU] | Freq: Every day | INTRAMUSCULAR | Status: DC

## 2021-05-26 MED ORDER — NITROGLYCERIN 1 MG/10 ML FOR IR/CATH LAB
INTRA_ARTERIAL | Status: AC
  Filled 2021-05-26: qty 10

## 2021-05-26 MED ORDER — ROSUVASTATIN CALCIUM 20 MG PO TABS
40.0000 mg | ORAL_TABLET | Freq: Every day | ORAL | Status: DC
  Administered 2021-05-26: 40 mg via ORAL
  Filled 2021-05-26 (×2): qty 2

## 2021-05-26 MED ORDER — MIDAZOLAM HCL 2 MG/2ML IJ SOLN
INTRAMUSCULAR | Status: DC | PRN
  Administered 2021-05-26 (×2): 1 mg via INTRAVENOUS

## 2021-05-26 MED ORDER — CHLORHEXIDINE GLUCONATE CLOTH 2 % EX PADS
6.0000 | MEDICATED_PAD | Freq: Every day | CUTANEOUS | Status: DC
  Administered 2021-05-27: 6 via TOPICAL

## 2021-05-26 MED ORDER — ASPIRIN 81 MG PO CHEW: 81.0000 mg | CHEWABLE_TABLET | Freq: Every day | ORAL | Status: DC

## 2021-05-26 MED ORDER — ONDANSETRON HCL 4 MG/2ML IJ SOLN: 4.0000 mg | Freq: Four times a day (QID) | INTRAMUSCULAR | Status: DC | PRN

## 2021-05-26 MED ORDER — HEPARIN (PORCINE) IN NACL 1000-0.9 UT/500ML-% IV SOLN
INTRAVENOUS | Status: DC | PRN
  Administered 2021-05-26 (×3): 500 mL

## 2021-05-26 SURGICAL SUPPLY — 17 items
BALLN SAPPHIRE 2.0X12 (BALLOONS) ×2
BALLN SAPPHIRE ~~LOC~~ 3.0X15 (BALLOONS) ×1 IMPLANT
BALLOON SAPPHIRE 2.0X12 (BALLOONS) IMPLANT
CATH INFINITI 5FR MULTPACK ANG (CATHETERS) ×1 IMPLANT
CATH VISTA GUIDE 6FR XBLAD4 (CATHETERS) ×1 IMPLANT
ELECT DEFIB PAD ADLT CADENCE (PAD) ×1 IMPLANT
KIT ENCORE 26 ADVANTAGE (KITS) ×1 IMPLANT
KIT HEART LEFT (KITS) ×2 IMPLANT
PACK CARDIAC CATHETERIZATION (CUSTOM PROCEDURE TRAY) ×2 IMPLANT
SHEATH PINNACLE 5F 10CM (SHEATH) ×1 IMPLANT
SHEATH PINNACLE 6F 10CM (SHEATH) ×1 IMPLANT
SHEATH PROBE COVER 6X72 (BAG) ×1 IMPLANT
STENT ONYX FRONTIER 3.0X18 (Permanent Stent) ×1 IMPLANT
TRANSDUCER W/STOPCOCK (MISCELLANEOUS) ×2 IMPLANT
TUBING CIL FLEX 10 FLL-RA (TUBING) ×2 IMPLANT
WIRE COUGAR XT STRL 190CM (WIRE) ×1 IMPLANT
WIRE EMERALD 3MM-J .035X150CM (WIRE) ×1 IMPLANT

## 2021-05-26 NOTE — Assessment & Plan Note (Addendum)
-  ESRD patient -Nephrology was consulted and he underwent treatment per their protocol and discharge potassium was 4.0 -Continue to monitor and trend and repeat CMP within 1 week

## 2021-05-26 NOTE — Progress Notes (Signed)
PT has left with [facility]. Pt had notified family.

## 2021-05-26 NOTE — Assessment & Plan Note (Addendum)
-  Was initiated on sliding scale coverage while hospitalized -Globin A1c was 6.7 -No diabetes medications on med list but he will need PCP follow-up for further blood sugar management -CBGs ranged from 68-130

## 2021-05-26 NOTE — Progress Notes (Signed)
[place] for Heparin Indication: chest pain/ACS  No Known Allergies  Patient Measurements: 107 kg  Vital Signs: Temp: 97.9 F (36.6 C) (02/06 1459) Temp Source: Oral (02/06 1459) BP: 108/57 (02/07 0200) Pulse Rate: 73 (02/07 0200)  Labs: Recent Labs    05/25/21 1545 05/25/21 1818 05/26/21 0109  HGB 11.2*  --   --   HCT 35.0*  --   --   PLT 231  --   --   CREATININE 8.06*  --   --   TROPONINIHS 120* 179* 837*    Estimated Creatinine Clearance: 10.4 mL/min (A) (by C-G formula based on SCr of 8.06 mg/dL (H)).  Assessment: 71 year old male with ESRD to begin heparin for chest pain.  Goal of Therapy:  Heparin level 0.3-0.7 units/ml Monitor platelets by anticoagulation protocol: Yes   Plan:  Heparin 2000 units iv bolus x 1 (sq heparin just given) Heparin drip at 1400 units / hr Heparin level and CBC in 8 hours Daily heparin level, CBC  Thank you Anette Guarneri, PharmD  05/26/2021,2:14 AM

## 2021-05-26 NOTE — Assessment & Plan Note (Addendum)
-   Patient undergoes hemodialysis Monday Wednesday Friday with his last hemodialysis session on 05/25/2021 he was dialyzed today prior to discharge and his BUN/creatinine prior discharge was 64/11.26 -He has also had a mild hyponatremia this morning which would likely be corrected and elevated anion gap as well as an elevated phosphorus level of 11.2 -Nephrology was consulted and recommended continuing Renvela and reordering calcitriol -They deferred his ESA today and felt.  No indication for acute transfusion of PRBCs. -Continue regularly scheduled dialysis sessions

## 2021-05-26 NOTE — Progress Notes (Signed)
Site Area: Right Femoral Artery Site Prior to Removal: Level 0 Pressure Applied For: 25  Mins Manual Pressure Pt Status During Pull:  Stable Post Pull Site: Level 0 Post Pull Instructions Given:  Yes Post Pull Pulses Present: Yes Dressing Applied: Gauze and Tegaderm Bedrest Begins @ 21:50  x 6hrs Post VS: BP 121/75

## 2021-05-26 NOTE — Consult Note (Signed)
Reason for Consult: To manage dialysis and dialysis related needs Referring Physician: Drexel Ivey is an 71 y.o. male with DM, HTN, MGUS and now ESRD since September of 2022-  HD at [facility] MWF-  did have HD yesterday-  he said after HD he felt well-  then woke up in the middle of the night with chest pain -  came to ER at [facility]-  has been admitted-  now with elevated troponin and cards has seen-  plan is for him to be transferred to [facility] for a heart cath-  he feels better at present-  as above completed HD yesterday without incident    Dialyzes at [facility]  MWF 4 hours EDW 107.5. HD Bath 1/2.5, Dialyzer unknown, Heparin yes-  2600 load and 600 per hour. Access AVF-  BFR 400.  Mircera 75 q 2 weeks and calcitriol 2 mgc TIW  Past Medical History:  Diagnosis Date   Asthma    CHF (congestive heart failure) (HCC)    Depression    Diabetes mellitus without complication (HCC)    Dyspnea    Stage 4 chronic kidney disease ([place])     Past Surgical History:  Procedure Laterality Date   ABCESS DRAINAGE     on back - possible spider bite   AV FISTULA PLACEMENT Left 12/23/2020   Procedure: LEFT ARM ARTERIOVENOUS (AV) FISTULA CREATION;  Surgeon: Rosetta Posner, MD;  Location: [facility];  Service: Vascular;  Laterality: Left;   [place] Left 02/10/2021   Procedure: LEFT ARM SECOND STAGE BASILIC VEIN TRANSPOSITION;  Surgeon: Rosetta Posner, MD;  Location: [facility];  Service: Vascular;  Laterality: Left;   COLONOSCOPY     INSERTION OF DIALYSIS CATHETER Right 12/23/2020   Procedure: INSERTION OF PALINDROME DIALYSIS CATHETER;  Surgeon: Rosetta Posner, MD;  Location: [facility];  Service: Vascular;  Laterality: Right;   REMOVAL OF A DIALYSIS CATHETER N/A 05/05/2021   Procedure: MINOR REMOVAL OF A TUNNELED DIALYSIS CATHETER;  Surgeon: Rosetta Posner, MD;  Location: [facility];  Service: Vascular;  Laterality: N/A;    Family History  Problem Relation Age of Onset   Breast cancer  Mother        died 64   Emphysema Father        died 3   Diabetes Sister    Diabetes Brother     Social History:  reports that he has never smoked. He has never used smokeless tobacco. He reports that he does not currently use alcohol. He reports that he does not use drugs.  Allergies: No Known Allergies  Medications: I have reviewed the patient's current medications.  Results for orders placed or performed during the hospital encounter of 05/25/21 (from the past 48 hour(s))  Basic metabolic panel     Status: Abnormal   Collection Time: 05/25/21  3:45 PM  Result Value Ref Range   Sodium 132 (L) 135 - 145 mmol/L   Potassium 3.0 (L) 3.5 - 5.1 mmol/L   Chloride 91 (L) 98 - 111 mmol/L   CO2 27 22 - 32 mmol/L   Glucose, Bld 171 (H) 70 - 99 mg/dL    Comment: Glucose reference range applies only to samples taken after fasting for at least 8 hours.   BUN 51 (H) 8 - 23 mg/dL   Creatinine, Ser 8.06 (H) 0.61 - 1.24 mg/dL   Calcium 8.6 (L) 8.9 - 10.3 mg/dL   GFR, Estimated 7 (L) >60 mL/min  Comment: (NOTE) Calculated using the CKD-EPI Creatinine Equation (2021)    Anion gap 14 5 - 15    Comment: Performed at [facility], [address] [place], [place] [address]  CBC     Status: Abnormal   Collection Time: 05/25/21  3:45 PM  Result Value Ref Range   WBC 6.5 4.0 - 10.5 K/uL   RBC 3.84 (L) 4.22 - 5.81 MIL/uL   Hemoglobin 11.2 (L) 13.0 - 17.0 g/dL   HCT 35.0 (L) 39.0 - 52.0 %   MCV 91.1 80.0 - 100.0 fL   MCH 29.2 26.0 - 34.0 pg   MCHC 32.0 30.0 - 36.0 g/dL   RDW 14.3 11.5 - 15.5 %   Platelets 231 150 - 400 K/uL   nRBC 0.0 0.0 - 0.2 %    Comment: Performed at [facility], [address], [place], [place] [address]  Troponin I (High Sensitivity)     Status: Abnormal   Collection Time: 05/25/21  3:45 PM  Result Value Ref Range   Troponin I (High Sensitivity) 120 (HH) <18 ng/L    Comment: CRITICAL RESULT CALLED TO, READ BACK BY AND VERIFIED WITH: OAKLEY,B ON 05/25/21 AT 1635 BY  LOY,C (NOTE) Elevated high sensitivity troponin I (hsTnI) values and significant  changes across serial measurements may suggest ACS but many other  chronic and acute conditions are known to elevate hsTnI results.  Refer to the Links section for chest pain algorithms and additional  guidance. Performed at [facility], [address], [place], [place] [address]   Troponin I (High Sensitivity)     Status: Abnormal   Collection Time: 05/25/21  6:18 PM  Result Value Ref Range   Troponin I (High Sensitivity) 179 (HH) <18 ng/L    Comment: DELTA CHECK NOTED CRITICAL RESULT CALLED TO, READ BACK BY AND VERIFIED WITH: BRAME,M ON 05/25/21 AT 1920 BY LOY,C (NOTE) Elevated high sensitivity troponin I (hsTnI) values and significant  changes across serial measurements may suggest ACS but many other  chronic and acute conditions are known to elevate hsTnI results.  Refer to the Links section for chest pain algorithms and additional  guidance. Performed at [facility], [address], [place], [place] [address]   Resp Panel by RT-PCR (Flu A&B, Covid) Nasopharyngeal Swab     Status: None   Collection Time: 05/25/21  9:10 PM   Specimen: Nasopharyngeal Swab; Nasopharyngeal(NP) swabs in vial transport medium  Result Value Ref Range   SARS Coronavirus 2 by RT PCR NEGATIVE NEGATIVE    Comment: (NOTE) SARS-CoV-2 target nucleic acids are NOT DETECTED.  The SARS-CoV-2 RNA is generally detectable in upper respiratory specimens during the acute phase of infection. The lowest concentration of SARS-CoV-2 viral copies this assay can detect is 138 copies/mL. A negative result does not preclude SARS-Cov-2 infection and should not be used as the sole basis for treatment or other patient management decisions. A negative result may occur with  improper specimen collection/handling, submission of specimen other than nasopharyngeal swab, presence of viral mutation(s) within the areas targeted by this assay, and  inadequate number of viral copies(<138 copies/mL). A negative result must be combined with clinical observations, patient history, and epidemiological information. The expected result is Negative.  Fact Sheet for Patients:  EntrepreneurPulse.com.au  Fact Sheet for Healthcare Providers:  IncredibleEmployment.be  This test is no t yet approved or cleared by the [place] FDA and  has been authorized for detection and/or diagnosis of SARS-CoV-2 by FDA under an Emergency  Use Authorization (EUA). This EUA will remain  in effect (meaning this test can be used) for the duration of the COVID-19 declaration under Section 564(b)(1) of the Act, 21 U.S.C.section 360bbb-3(b)(1), unless the authorization is terminated  or revoked sooner.       Influenza A by PCR NEGATIVE NEGATIVE   Influenza B by PCR NEGATIVE NEGATIVE    Comment: (NOTE) The Xpert Xpress SARS-CoV-2/FLU/RSV plus assay is intended as an aid in the diagnosis of influenza from Nasopharyngeal swab specimens and should not be used as a sole basis for treatment. Nasal washings and aspirates are unacceptable for Xpert Xpress SARS-CoV-2/FLU/RSV testing.  Fact Sheet for Patients: EntrepreneurPulse.com.au  Fact Sheet for Healthcare Providers: IncredibleEmployment.be  This test is not yet approved or cleared by the [place] FDA and has been authorized for detection and/or diagnosis of SARS-CoV-2 by FDA under an Emergency Use Authorization (EUA). This EUA will remain in effect (meaning this test can be used) for the duration of the COVID-19 declaration under Section 564(b)(1) of the Act, 21 U.S.C. section 360bbb-3(b)(1), unless the authorization is terminated or revoked.  Performed at [facility], [address], [place], [place] [address]   Troponin I (High Sensitivity)     Status: Abnormal   Collection Time: 05/26/21  1:09 AM  Result Value Ref Range    Troponin I (High Sensitivity) 837 (HH) <18 ng/L    Comment: DELTA CHECK NOTED CRITICAL RESULT CALLED TO, READ BACK BY AND VERIFIED WITH: BRAMES, M@0158  by matthews, b 2.7.23 (NOTE) Elevated high sensitivity troponin I (hsTnI) values and significant  changes across serial measurements may suggest ACS but many other  chronic and acute conditions are known to elevate hsTnI results.  Refer to the Links section for chest pain algorithms and additional  guidance. Performed at [facility], [address] [place], [place] [address]   Hemoglobin A1c     Status: Abnormal   Collection Time: 05/26/21  1:09 AM  Result Value Ref Range   Hgb A1c MFr Bld 6.7 (H) 4.8 - 5.6 %    Comment: (NOTE) Pre diabetes:          5.7%-6.4%  Diabetes:              >6.4%  Glycemic control for   <7.0% adults with diabetes    Mean Plasma Glucose 145.59 mg/dL    Comment: Performed at [facility] [address], [place], [place] [address]  CBG monitoring, ED     Status: Abnormal   Collection Time: 05/26/21  1:15 AM  Result Value Ref Range   Glucose-Capillary 106 (H) 70 - 99 mg/dL    Comment: Glucose reference range applies only to samples taken after fasting for at least 8 hours.  Troponin I (High Sensitivity)     Status: Abnormal   Collection Time: 05/26/21  4:03 AM  Result Value Ref Range   Troponin I (High Sensitivity) 886 (HH) <18 ng/L    Comment: DELTA CHECK NOTED CRITICAL RESULT CALLED TO, READ BACK BY AND VERIFIED WITH: BRAME,M@0520  BY MATTHEWS, B 2.7.23 (NOTE) Elevated high sensitivity troponin I (hsTnI) values and significant  changes across serial measurements may suggest ACS but many other  chronic and acute conditions are known to elevate hsTnI results.  Refer to the Links section for chest pain algorithms and additional  guidance. Performed at [facility], [address], [place], [place] [address]   CBC     Status: Abnormal   Collection Time: 05/26/21  4:03  AM  Result Value Ref  Range   WBC 6.3 4.0 - 10.5 K/uL   RBC 3.49 (L) 4.22 - 5.81 MIL/uL   Hemoglobin 10.2 (L) 13.0 - 17.0 g/dL   HCT 32.6 (L) 39.0 - 52.0 %   MCV 93.4 80.0 - 100.0 fL   MCH 29.2 26.0 - 34.0 pg   MCHC 31.3 30.0 - 36.0 g/dL   RDW 14.3 11.5 - 15.5 %   Platelets 207 150 - 400 K/uL   nRBC 0.0 0.0 - 0.2 %    Comment: Performed at [facility], [address] [place], [place] [address]  Glucose, capillary     Status: Abnormal   Collection Time: 05/26/21  8:46 AM  Result Value Ref Range   Glucose-Capillary 130 (H) 70 - 99 mg/dL    Comment: Glucose reference range applies only to samples taken after fasting for at least 8 hours.    DG Chest 2 View  Result Date: 05/25/2021 CLINICAL DATA:  Right chest pain after dialysis today. The patient has been having right chest pain for the past 3 days. EXAM: CHEST - 2 VIEW COMPARISON:  12/23/2020 FINDINGS: Normal sized heart. Mildly tortuous and calcified thoracic aorta. Clear lungs. Mild peribronchial thickening with improvement. No acute bony abnormality. IMPRESSION: Mild bronchitic changes with improvement. Electronically Signed   By: Claudie Revering M.D.   On: 05/25/2021 15:21    ROS: at present feeling well Blood pressure 128/70, pulse 82, temperature 98.7 F (37.1 C), temperature source Oral, resp. rate 19, weight 109.6 kg, SpO2 99 %. General appearance: alert and no distress Resp: clear to auscultation bilaterally Cardio: regular rate and rhythm, S1, S2 normal, no murmur, click, rub or gallop GI: soft, non-tender; bowel sounds normal; no masses,  no organomegaly Extremities: edema trace to 1+ Left upper aVF-  patent   Assessment/Plan: 71 year old BM with ESRD presenting with CP and elevated troponin 1 CP-  elevated troponin-  cards involved- plan is to transfer to [facility] for heart cath hopefully today  2 ESRD: normally MWF at [facility]-  did complete HD yesterday-  he is on a 1 K bath for 4 hours which seems excessive-  his potassium upon admission  yesterday late afternoon was 3.0 -  I dont see that he received any potassium.  Not sure he needs a 1 K bath for the entire treatment-  Likely will need dialysis again tomorrow-  may need to coordinate with cath lab 3 Hypertension: controlled for now-  on coreg 4. 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 Metabolic Bone Disease: is on calcitriol 2 mcg oral TIW-  also renvela has been ordered 6. Potassium -  as above is on low K bath for entire 4 hour treatment-  K was low on admit-  will follow and titrate bath appropriately  Louis Meckel 05/26/2021, 9:20 AM

## 2021-05-26 NOTE — Plan of Care (Signed)

## 2021-05-26 NOTE — Progress Notes (Signed)
°   05/26/21 0700  Charting Type  Charting Type Initial  Assessment of needs addressed Yes  Orders Chart Check (once per shift) Completed  Hinds Work Intensity Score (Update with each assessment and as needed)  Work Intensity Score (Level) 2  Level 2 Intensity N.Treatment-complex Q 4-6 hours  Neurological  Neuro (WDL) WDL  NuDESC - Delirium Risk Factor Assessment (Complete for non-ICU patients)  Delirium Risk Factor Assessment Age greater than or equal to 13 years  NuDESC - Nursing Delirium Screening Scale (Complete for non-ICU patients)  Disorientation 0  Inappropriate Behavior 0  Inappropriate Communications 0  Illusions/hallucinations 0  Psychomotor Retardation 0  NuDESC Total Score 0  NuDESC - Delirium Prevention:  Universal Requirements (Complete for all non-ICU patients with a delirium risk factor)  Universal Precautions Initiated *See Row Information* Yes  HEENT  HEENT (WDL) WDL  Respiratory  Respiratory (WDL) WDL  Respiratory Pattern Regular;Unlabored  Cardiac  Cardiac (WDL) X  Pulse Regular  Heart Sounds S1, S2  Jugular Venous Distention (JVD) No  ECG Monitoring  ECG Heart Rate 67  Vascular  Vascular (WDL) WDL  Integumentary  Integumentary (WDL) X  RN Assisting with Skin Assessment on Admission Tresa Jolley  Skin Color Ashen  Skin Condition Dry  Skin Integrity Intact  Skin Turgor Non-tenting  Braden Scale (Ages 44 and up)  Sensory Perceptions 4  Moisture 4  Activity 3  Mobility 3  Nutrition 3  Friction and Shear 3  Braden Scale Score 20  Musculoskeletal  Musculoskeletal (WDL) X  Assistive Device None  Generalized Weakness Yes  Weight Bearing Restrictions No  Musculoskeletal Details  RLE Weakness  LLE Weakness  Gastrointestinal  Gastrointestinal (WDL) WDL  Last BM Date 05/25/21  GU Assessment  Genitourinary (WDL) X  Genitourinary Symptoms Other (Comment) (very little urine)  Genitalia  Male Genitalia Intact  Psychosocial  Psychosocial  (WDL) WDL  Neurological  Level of Consciousness Alert

## 2021-05-26 NOTE — Assessment & Plan Note (Addendum)
EKG was without ischemic changes -Troponin up trending from 120 --> 179, continue to cycle Troponin and went up to 918 -Pain is atypical - worse laying down, better sitting on edge of bed, lateral to midclavicular line -Repeat EKG for any further chest pain Patient is high risk and was admitted to telemetry.  Cardiology evaluated and felt that he needed an ischemic evaluation given the concern he was transferred to [facility] for a catheterization yesterday afternoon. -His echo was reviewed and showed an LVEF of 35 to 40% back in 2022 in September -He was initiated on low-dose carvedilol 3.125 mg p.o. twice daily and a statin was added -Cardiac catheterization was done and showed a predominant single-vessel coronary artery disease with 30% diffuse irregularity of the proximal LAD with ostial diagonal stenosis and small collateral vessels and 95% focal proximal to mid stenosis with mild luminal irregularity in the mid LAD.  He underwent successful PCI to the 95% LAD stenosis with a 3.0 x 18 mm Medtronic Onyx from 2-year stent and the recommendation was for dual antiplatelet therapy for 1 year as well as aggressive lipid-lowering target with an LDL of less than 55 -Audiology recommended continuing atorvastatin 80 mg p.o. daily, aspirin 81 mg p.o. daily, Brilinta 90 mg p.o. twice daily as well as carvedilol 3.125 mg p.o. twice daily -Patient to follow-up with cardiology in [place] -Repeat ECHO prior to D/C showed:  "Mild hypokinesis of the mid anteroseptal/inferoseptal wall. Left ventricular ejection fraction, by estimation, is 50 to 55%. The left ventricle has low normal function. The left ventricle has no regional wall  motion abnormalities. Left ventricular  diastolic parameters are consistent with Grade I diastolic dysfunction (impaired relaxation). Right ventricular systolic function is normal. The right ventricular size is normal. No evidence of mitral valve regurgitation.  Aortic valve  regurgitation is mild. The inferior vena cava is normal in size with greater than 50% respiratory variability, suggesting right atrial pressure of 3 mmHg."

## 2021-05-26 NOTE — Progress Notes (Signed)
[facility] called for report on the pt

## 2021-05-26 NOTE — Progress Notes (Signed)
240cc of orange juice given to patient, tolerated well, scant amount of blood noted on pillow case, pt scratched head, stated he must have done it in sleep, new pillow case placed, safety maintained, no c/o pain

## 2021-05-26 NOTE — Progress Notes (Signed)
Bright red blood, small in amount noted on pillow case, pt stated he scratched head, blood noted in hair to right side of head towards back of head, pillow case changed, pt encouraged not to scratch head, verbal acknowledgement noted, no active bleeding noted, safety maintained

## 2021-05-26 NOTE — Assessment & Plan Note (Addendum)
-  Troponin initially 120, increasing to 179 and then trended up to 918 -Troponins were elevated in the setting of NSTEMI ESRD is likely contributing to the elevation in troponin given his symptoms cardiology was consulted and recommended transfer to [name] for cardiac catheterization -He was monitored on telemetry and cardiology made further recommendations

## 2021-05-26 NOTE — Consult Note (Addendum)
Cardiology Consultation:   Patient ID: [name] [name] MRN: [id number]; DOB: Aug 26, 1950  Admit date: 05/25/2021 Date of Consult: 05/26/2021  PCP:  Neale Burly, MD   [place] Providers Cardiologist:  None        Patient Profile:   [name] [name] is a 71 y.o. male with a hx of ESRD on HD, CHF EF 30% 12/2020 who is being seen 05/26/2021 for the evaluation of chest pain and elevated troponins at the request of Dr. Dyann Kief.  History of Present Illness:   [name] [name] is a 71 yo male patient with ESRD on HD M/W/F since 01/2021, DM2, asthma, obesity with 100 lb weight loss, chronic LE edema. Was diagnosed with CHF last July hospitalized in [place] and [place], never had a cath. Echo 12/2020 LVEF 30-35% global HK.  Patient says last Thurs he started coughing, short of breath with chest tightness off & on relieved with sitting up. Did ok over the weekend. Yest after dialysis he took a nap and woke up with squeezing chest pain, dyspnea and cough(has asthma and wheezing as well). Came to ED EKG NSR with lateral infarct and Troponins trending up >800. No pain since he got here.   Past Medical History:  Diagnosis Date   Asthma    CHF (congestive heart failure) ([place])    Depression    Diabetes mellitus without complication ([place])    Dyspnea    Stage 4 chronic kidney disease ([place])     Past Surgical History:  Procedure Laterality Date   ABCESS DRAINAGE     on back - possible spider bite   AV FISTULA PLACEMENT Left 12/23/2020   Procedure: LEFT ARM ARTERIOVENOUS (AV) FISTULA CREATION;  Surgeon: Rosetta Posner, MD;  Location: [facility];  Service: Vascular;  Laterality: Left;   [place] Left 02/10/2021   Procedure: LEFT ARM SECOND STAGE BASILIC VEIN TRANSPOSITION;  Surgeon: Rosetta Posner, MD;  Location: [facility];  Service: Vascular;  Laterality: Left;   COLONOSCOPY     INSERTION OF DIALYSIS CATHETER Right 12/23/2020   Procedure: INSERTION OF PALINDROME DIALYSIS CATHETER;   Surgeon: Rosetta Posner, MD;  Location: [facility];  Service: Vascular;  Laterality: Right;   REMOVAL OF A DIALYSIS CATHETER N/A 05/05/2021   Procedure: MINOR REMOVAL OF A TUNNELED DIALYSIS CATHETER;  Surgeon: Rosetta Posner, MD;  Location: [facility];  Service: Vascular;  Laterality: N/A;     Home Medications:  Prior to Admission medications   Medication Sig Start Date End Date Taking? Authorizing Provider  albuterol (VENTOLIN HFA) 108 (90 Base) MCG/ACT inhaler INHALE 2 PUFFS BY MOUTH EVERY 4 HOURS AS NEEDED ONLY  IF  YOU  CANT  CATCH  YOUR  BREATH 08/07/19  Yes Tanda Rockers, MD  allopurinol (ZYLOPRIM) 300 MG tablet Take 300 mg by mouth daily as needed (gout).   Yes [provider]  sevelamer (RENAGEL) 800 MG tablet Take 800 mg by mouth 3 (three) times daily with meals.   Yes [provider]    Inpatient Medications: Scheduled Meds:  aspirin EC  81 mg Oral Daily   carvedilol  3.125 mg Oral BID WC   insulin aspart  0-15 Units Subcutaneous TID WC   insulin aspart  0-5 Units Subcutaneous QHS   pantoprazole  40 mg Oral Daily   rosuvastatin  40 mg Oral Daily   sevelamer carbonate  800 mg Oral TID WC   Continuous Infusions:  heparin 1,400 Units/hr (05/26/21 0453)   PRN Meds:  acetaminophen, nitroGLYCERIN, ondansetron (ZOFRAN) IV  Allergies:   No Known Allergies  Social History:   Social History   Socioeconomic History   Marital status: Married    Spouse name: Not on file   Number of children: Not on file   Years of education: Not on file   Highest education level: Not on file  Occupational History   Not on file  Tobacco Use   Smoking status: Never   Smokeless tobacco: Never  Vaping Use   Vaping Use: Never used  Substance and Sexual Activity   Alcohol use: Not Currently   Drug use: Never   Sexual activity: Not Currently  Other Topics Concern   Not on file  Social History Narrative   Not on file   Social Determinants of Health   Financial Resource Strain:  Low Risk    Difficulty of Paying Living Expenses: Not hard at all  Food Insecurity: No Food Insecurity   Worried About Charity fundraiser in the Last Year: Never true   [place] in the Last Year: Never true  Transportation Needs: No Transportation Needs   Lack of Transportation (Medical): No   Lack of Transportation (Non-Medical): No  Physical Activity: Inactive   Days of Exercise per Week: 0 days   Minutes of Exercise per Session: 0 min  Stress: No Stress Concern Present   Feeling of Stress : Not at all  Social Connections: Moderately Isolated   Frequency of Communication with Friends and Family: More than three times a week   Frequency of Social Gatherings with Friends and Family: More than three times a week   Attends Religious Services: Never   Marine scientist or Organizations: No   Attends Music therapist: Never   Marital Status: Married  Human resources officer Violence: Not At Risk   Fear of Current or Ex-Partner: No   Emotionally Abused: No   Physically Abused: No   Sexually Abused: No    Family History:     Family History  Problem Relation Age of Onset   Breast cancer Mother        died 87   Emphysema Father        died 82   Diabetes Sister    Diabetes Brother      ROS:  Please see the history of present illness.  Review of Systems  Constitutional: Positive for weight loss.  HENT: Negative.    Cardiovascular:  Positive for chest pain and leg swelling.  Respiratory:  Positive for cough and wheezing.   Endocrine: Negative.   Hematologic/Lymphatic: Negative.   Musculoskeletal: Negative.   Gastrointestinal: Negative.   Genitourinary: Negative.   Neurological: Negative.    All other ROS reviewed and negative.     Physical Exam/Data:   Vitals:   05/26/21 0700 05/26/21 0729 05/26/21 0804 05/26/21 0815  BP: 123/74  128/70   Pulse: 88  82   Resp: 14  19   Temp:  98.5 F (36.9 C) 98.7 F (37.1 C)   TempSrc:  Oral Oral   SpO2:  98%  99%   Weight:    109.6 kg    Intake/Output Summary (Last 24 hours) at 05/26/2021 0940 Last data filed at 05/26/2021 0453 Gross per 24 hour  Intake 49.26 ml  Output --  Net 49.26 ml   Last 3 Weights 05/26/2021 05/21/2021 04/01/2021  Weight (lbs) 241 lb 9.6 oz 235 lb 7.2 oz 234 lb  Weight (kg) 109.589 kg 106.8  kg 106.142 kg     Body mass index is 34.2 kg/m.  General:  Well nourished, well developed, in no acute distress  HEENT: normal Neck: no JVD Vascular: No carotid bruits; Distal pulses 2+ bilaterally Cardiac:  normal S1, S2; RRR; 2/6 systolic murmur LSB Lungs:  clear to auscultation bilaterally, no wheezing, rhonchi or rales  Abd: soft, nontender, no hepatomegaly  Ext:  chronic RLE Musculoskeletal:  No deformities, BUE and BLE strength normal and equal Skin: warm and dry  Neuro:  CNs 2-12 intact, no focal abnormalities noted Psych:  Normal affect   EKG:  The EKG was personally reviewed and demonstrates:  NSR with inflateral Q waves, PVC's Telemetry:  Telemetry was personally reviewed and demonstrates:  NSR with PVC's  Relevant CV Studies:  Echo 12/2020 IMPRESSIONS     1. Left ventricular ejection fraction, by estimation, is 35 to 40%. The  left ventricle has moderately decreased function. The left ventricle  demonstrates global hypokinesis. There is mild left ventricular  hypertrophy. Left ventricular diastolic  parameters are consistent with Grade I diastolic dysfunction (impaired  relaxation).   2. Right ventricular systolic function is normal. The right ventricular  size is normal. There is normal pulmonary artery systolic pressure. The  estimated right ventricular systolic pressure is 03.0 mmHg.   3. Left atrial size was moderately dilated.   4. Catheter tip visualized. Right atrial size was mildly dilated.   5. The mitral valve is grossly normal. Mild mitral valve regurgitation.   6. The aortic valve is tricuspid. Aortic valve regurgitation is not  visualized.  Aortic valve mean gradient measures 7.0 mmHg.   7. The inferior vena cava is normal in size with greater than 50%  respiratory variability, suggesting right atrial pressure of 3 mmHg.   Comparison(s): Prior images reviewed side by side. LVEF has decreased  since prior study.   Laboratory Data:  High Sensitivity Troponin:   Recent Labs  Lab 05/25/21 1545 05/25/21 1818 05/26/21 0109 05/26/21 0403  TROPONINIHS 120* 179* 837* 886*     Chemistry Recent Labs  Lab 05/25/21 1545  NA 132*  K 3.0*  CL 91*  CO2 27  GLUCOSE 171*  BUN 51*  CREATININE 8.06*  CALCIUM 8.6*  GFRNONAA 7*  ANIONGAP 14    No results for input(s): PROT, ALBUMIN, AST, ALT, ALKPHOS, BILITOT in the last 168 hours. Lipids No results for input(s): CHOL, TRIG, HDL, LABVLDL, LDLCALC, CHOLHDL in the last 168 hours.  Hematology Recent Labs  Lab 05/25/21 1545 05/26/21 0403  WBC 6.5 6.3  RBC 3.84* 3.49*  HGB 11.2* 10.2*  HCT 35.0* 32.6*  MCV 91.1 93.4  MCH 29.2 29.2  MCHC 32.0 31.3  RDW 14.3 14.3  PLT 231 207   Thyroid No results for input(s): TSH, FREET4 in the last 168 hours.  BNPNo results for input(s): BNP, PROBNP in the last 168 hours.  DDimer No results for input(s): DDIMER in the last 168 hours.   Radiology/Studies:  DG Chest 2 View  Result Date: 05/25/2021 CLINICAL DATA:  Right chest pain after dialysis today. The patient has been having right chest pain for the past 3 days. EXAM: CHEST - 2 VIEW COMPARISON:  12/23/2020 FINDINGS: Normal sized heart. Mildly tortuous and calcified thoracic aorta. Clear lungs. Mild peribronchial thickening with improvement. No acute bony abnormality. IMPRESSION: Mild bronchitic changes with improvement. Electronically Signed   By: Claudie Revering M.D.   On: 05/25/2021 15:21     Assessment and Plan:   NSTEMI-Chest  pain waxing and waning since Thurs with elevated troponins [phone], now pain free on IV heparin. May have had an MI in July with LVD.Will transfer to  [facility] on hospitalist service and plan cath this afternoon. Patient agreeable.  I have reviewed the risks, indications, and alternatives to angioplasty and stenting with the patient. Risks include but are not limited to bleeding, infection, vascular injury, stroke, myocardial infection, arrhythmia, kidney injury, radiation-related injury in the case of prolonged fluoroscopy use, emergency cardiac surgery, and death. The patient understands the risks of serious complication is low (<9%) and patient agrees to proceed.    Cardiomyopathy LVEF 35-40% echo 12/2020 on low dose carvedilol 3.125 mg bid. Add statin. Will add other meds after cath and as BP tolerates.   ESRD on HD M/W/F  DM2 managed by primary team  History of obesity with intentional 100 lb weight loss  Chronic left LE edema with extensive work up in past per patient  Hypokalemia K 3.0 yest, will recheck   Risk Assessment/Risk Scores:     HEAR Score (for undifferentiated chest pain):             For questions or updates, please contact [place] Please consult [email or web address] for contact info under    Signed, Werner Lean, MD  05/26/2021 9:40 AM   Personally seen and examined. Agree with APP above with the following comments: Briefly 71 yo M  with a history of ESRD, DM and HFrEF who presents with return of chest pain with no prior ischemic work up  Patient notes that she is feeling fine.  Notes that in July of last year he had new on set chest pain in the center of his chest.  This , couple with worsen bilateral LE edema, lead to Hospitalizations in [place] and [place].  Had no further chest pain, but had worsening kidney function.  Is s/p AV fistula in 9/22 and had tolerated HD without incident.  Post Hd 05/25/21 had chest tightness that was position.  It was persistent chest squeezing that led him to the ED. Presently CP free.  No SOD.  Leg swelling is much improved.  No palpitations or syncope.  No FHX of  CAD.   Exam notable for Decreased breath sounds without crackles or rales.  He and +2 R radial and R femoral pulse.  His AV fistula is left forearm.  He has +1 bilateral pitting edema.  He has a II/VI holosystolic murmur.  No tachypnea. In NAD.   Labs notable for troponin elevation 120-> 179-> 837-> 886  Personally reviewed relevant tests; Tele shows q wave in inferolatreral and last LVEF 35%.  Would recommend  - I worry that he has already completed and infarct as far back as this summer, but presents with NSTEMI. - He is on ASA, coreg 3.125, heparin drip, we have added rosuvastatin 40 mg PO daily - Recommend transfer to [facility] for LHC, Will get echo at that time  Risks and benefits of cardiac catheterization have been discussed with the patient.  These include bleeding, infection, kidney damage, stroke, heart attack, death.  The patient understands these risks and is willing to proceed.   Rudean Haskell, MD [address] [place], [place] [address] [phone]  9:40 AM

## 2021-05-26 NOTE — Progress Notes (Addendum)
Patient seen and examined.  Admitted after midnight secondary to chest pain.  Patient heart score of 4, with elevated/rising troponin.  Currently reports been chest pain-free.  No nausea, no vomiting, no diaphoresis.  Stable vital signs on examination.  Please refer to H&P written by Dr. Clearence Ped for further info/details.  Plan: -Follow-up 2D echo -Continue heparin drip -Follow cardiology service recommendations. -Continue to cycle troponin -Continue telemetry monitoring -started on baby aspirin, low dose coreg, PPI and continue PRN NTG. -Nephrology has been consulted and will follow rec's for HD continuation.  Barton Dubois MD [phone]

## 2021-05-26 NOTE — H&P (Signed)
History and Physical    Patient: [name] [name]:[id number] DOB: Feb 28, 1951 DOA: 05/25/2021 DOS: the patient was seen and examined on 05/26/2021 PCP: Neale Burly, MD  Patient coming from: Home  Chief Complaint:  Chief Complaint  Patient presents with   Chest Pain    HPI: [name] [name] is a 71 y.o. male with medical history significant of with history of asthma, CHF, depression, diabetes mellitus type 2, ESRD on hemodialysis Monday Wednesday Friday, presents the ED with a chief complaint of chest pain.  Patient reports that 4 days ago he had a coughing spell and then chest pain afterwards.  During his coughing spell he became very short of breath.  The chest pain remained after the coughing spell was over.  Weekend he had no chest pains.  Today he went to hemodialysis and had no problems.  When he got back from hemodialysis chest pain again.  He did have a cough this time, but the cough is getting better the chest pain remains the same.  He reports the chest pain is worse with exertion and worse with laying back.  It is better when he sits up on the side of the bed.  He reports that his cough has been dry.  He does report is not uncommon for him to have a cough because he has asthma.  His chest pain is located lateral to the left midclavicular line.  He feels like a squeezing sensation with no radiation.  It last 5 to 10 minutes.  Patient has never had anything like this before.  He does report that in July he was diagnosed with CHF.  Patient reports that at the time of my interview he is chest pain-free.  ED physician called and spoke with on-call cardiology who recommended hobs overnight and inpatient cardio consult in the a.m.  On exam patient had left lower extremity edema.  He reports that its not uncommon for his legs to become edematous, and usually goes away with dialysis.  He had not noticed that his left leg has remained edematous.  He does not know how long this has been going  on.  Patient does not smoke, does not drink alcohol, does not use illicit drugs.  Patient is vaccinated for COVID.  Patient is full code.  Review of Systems: As mentioned in the history of present illness. All other systems reviewed and are negative. Past Medical History:  Diagnosis Date   Asthma    CHF (congestive heart failure) ([place])    Depression    Diabetes mellitus without complication ([place])    Dyspnea    Stage 4 chronic kidney disease ([place])    Past Surgical History:  Procedure Laterality Date   ABCESS DRAINAGE     on back - possible spider bite   AV FISTULA PLACEMENT Left 12/23/2020   Procedure: LEFT ARM ARTERIOVENOUS (AV) FISTULA CREATION;  Surgeon: Rosetta Posner, MD;  Location: [facility];  Service: Vascular;  Laterality: Left;   [place] Left 02/10/2021   Procedure: LEFT ARM SECOND STAGE BASILIC VEIN TRANSPOSITION;  Surgeon: Rosetta Posner, MD;  Location: [facility];  Service: Vascular;  Laterality: Left;   COLONOSCOPY     INSERTION OF DIALYSIS CATHETER Right 12/23/2020   Procedure: INSERTION OF PALINDROME DIALYSIS CATHETER;  Surgeon: Rosetta Posner, MD;  Location: [facility];  Service: Vascular;  Laterality: Right;   REMOVAL OF A DIALYSIS CATHETER N/A 05/05/2021   Procedure: MINOR REMOVAL OF A TUNNELED DIALYSIS CATHETER;  Surgeon:  Rosetta Posner, MD;  Location: [facility];  Service: Vascular;  Laterality: N/A;   Social History:  reports that he has never smoked. He has never used smokeless tobacco. He reports that he does not currently use alcohol. He reports that he does not use drugs.  No Known Allergies  Family History  Problem Relation Age of Onset   Breast cancer Mother        died 5   Emphysema Father        died 31   Diabetes Sister    Diabetes Brother     Prior to Admission medications   Medication Sig Start Date End Date Taking? Authorizing Provider  albuterol (VENTOLIN HFA) 108 (90 Base) MCG/ACT inhaler INHALE 2 PUFFS BY MOUTH EVERY 4 HOURS AS NEEDED  ONLY  IF  YOU  CANT  CATCH  YOUR  BREATH 08/07/19  Yes Tanda Rockers, MD  allopurinol (ZYLOPRIM) 300 MG tablet Take 300 mg by mouth daily as needed (gout).   Yes [provider]  sevelamer (RENAGEL) 800 MG tablet Take 800 mg by mouth 3 (three) times daily with meals.   Yes [provider]    Physical Exam: Vitals:   05/25/21 2200 05/25/21 2230 05/25/21 2300 05/25/21 2330  BP: 132/76 133/74 136/72 129/60  Pulse: 79 75 76 81  Resp: 15 15 14 14   Temp:      TempSrc:      SpO2: 94% 94% 100% 100%   1.  General: Patient lying supine in bed,  no acute distress   2. Psychiatric: Alert and oriented x 3, mood and behavior normal for situation, pleasant and cooperative with exam   3. Neurologic: Speech and language are normal, face is symmetric, moves all 4 extremities voluntarily, at baseline without acute deficits on limited exam   4. HEENMT:  Head is atraumatic, normocephalic, pupils reactive to light, neck is supple, trachea is midline, mucous membranes are moist   5. Respiratory : Lungs are clear to auscultation bilaterally without wheezing, rhonchi, rales, no cyanosis, no increase in work of breathing or accessory muscle use   6. Cardiovascular : Heart rate normal, rhythm is regular, no murmurs, rubs or gallops, unilateral left lower extremity edema, peripheral pulses palpated   7. Gastrointestinal:  Abdomen is soft, nondistended, nontender to palpation bowel sounds active, no masses or organomegaly palpated   8. Skin:  Skin is warm, dry and intact without rashes, acute lesions, or ulcers on limited exam, venous stasis changes of the lower extremities   9.Musculoskeletal:  No acute deformities or trauma, no asymmetry in tone, peripheral pulses palpated, no tenderness to palpation in the extremities   Data Reviewed: In the ED Temp 97.9, heart rate 75-1 01, respiratory rate 13-20, blood pressure 118/65, satting at 98% No leukocytosis, hemoglobin stable at  11.2 Slight hyponatremia at 132, hypokalemia at 3.0, hypochloremia 91, bicarb 27, BUN 51 and creatinine 8.07 with a glucose of 171 Chest x-ray shows mild bronchitic changes with improvement EKG shows a heart rate of 100, sinus rhythm, QTc 490 Troponin increased from 120-> 179  Assessment and Plan: * Chest pain- (present on admission) EKG is without ischemic changes Troponin up trending from 120 --> 179, continue to cycle tropes Pain is atypical - worse laying down, better sitting on edge of bed, lateral to midclavicular line EKG for any further chest pain Patient is high risk- admit for observation monitor on telemetry inpatient consult in the a.m.  Diabetes mellitus type 2 in nonobese (  [place]) Sliding scale coverage Check hemoglobin A1c No diabetes medications on med list, will need updated med list when pharmacies are open on day team  Elevated troponin- (present on admission) Troponin initially 120, increasing to 179 Continue cycle tropes ESRD is likely contributing to the elevation in troponin but with a delta Trope being that significant we will consult cardiology team for on-call cardiology recommendation Per ED provider cardiology reports no heparin drip needed at this time Continue to monitor on telemetry  ESRD (end stage renal disease) ([place])- (present on admission) HD Monday Wednesday Friday Last dialysis was February 6 Consult nephro  Hypokalemia- (present on admission) ESRD patient Consult nephro Defer electrolyte management to the nephro team appreciate their recs       Advance Care Planning:   Code Status: Not on file full  Consults: Cardiology and nephrology  Family Communication: No family at bedside  Severity of Illness: The appropriate patient status for this patient is OBSERVATION. Observation status is judged to be reasonable and necessary in order to provide the required intensity of service to ensure the patient's safety. The patient's presenting  symptoms, physical exam findings, and initial radiographic and laboratory data in the context of their medical condition is felt to place them at decreased risk for further clinical deterioration. Furthermore, it is anticipated that the patient will be medically stable for discharge from the hospital within 2 midnights of admission.   Author: Rolla Plate, DO 05/26/2021 12:25 AM  For on call review [email or web address].

## 2021-05-26 NOTE — Progress Notes (Addendum)
ANTICOAGULATION CONSULT NOTE   Pharmacy Consult for Heparin Indication: NSTEMI  No Known Allergies  Patient Measurements: 107 kg  Vital Signs: Temp: 98.7 F (37.1 C) (02/07 0804) Temp Source: Oral (02/07 0804) BP: 128/70 (02/07 0804) Pulse Rate: 82 (02/07 0804)  Labs: Recent Labs    05/25/21 1545 05/25/21 1818 05/26/21 0109 05/26/21 0403 05/26/21 1053  HGB 11.2*  --   --  10.2* 11.4*  HCT 35.0*  --   --  32.6* 36.6*  PLT 231  --   --  207 166  HEPARINUNFRC  --   --   --   --  0.43  CREATININE 8.06*  --   --   --  10.13*  TROPONINIHS 120*   < > 837* 886* 918*   < > = values in this interval not displayed.     Estimated Creatinine Clearance: 8.4 mL/min (A) (by C-G formula based on SCr of 10.13 mg/dL (H)).  Assessment: 71 year old male with ESRD to begin heparin for chest pain/NSTEMI. Patient being admitted to [facility] for cath this afternoon.  HL 0.43 CBC WNL Trop 918  Goal of Therapy:  Heparin level 0.3-0.7 units/ml Monitor platelets by anticoagulation protocol: Yes   Plan:  Continue heparin infusion at 1400 units/hr. Heparin level in 8 hours and daily. Monitor H&H and platelets.   Margot Ables, PharmD Clinical Pharmacist 05/26/2021 12:58 PM

## 2021-05-27 ENCOUNTER — Observation Stay (HOSPITAL_BASED_OUTPATIENT_CLINIC_OR_DEPARTMENT_OTHER): Payer: PPO

## 2021-05-27 ENCOUNTER — Encounter (HOSPITAL_COMMUNITY): Payer: Self-pay | Admitting: Cardiovascular Disease

## 2021-05-27 DIAGNOSIS — I2510 Atherosclerotic heart disease of native coronary artery without angina pectoris: Secondary | ICD-10-CM | POA: Diagnosis not present

## 2021-05-27 DIAGNOSIS — I120 Hypertensive chronic kidney disease with stage 5 chronic kidney disease or end stage renal disease: Secondary | ICD-10-CM | POA: Diagnosis not present

## 2021-05-27 DIAGNOSIS — D638 Anemia in other chronic diseases classified elsewhere: Secondary | ICD-10-CM

## 2021-05-27 DIAGNOSIS — D631 Anemia in chronic kidney disease: Secondary | ICD-10-CM | POA: Diagnosis not present

## 2021-05-27 DIAGNOSIS — E669 Obesity, unspecified: Secondary | ICD-10-CM

## 2021-05-27 DIAGNOSIS — E1122 Type 2 diabetes mellitus with diabetic chronic kidney disease: Secondary | ICD-10-CM | POA: Diagnosis not present

## 2021-05-27 DIAGNOSIS — J45909 Unspecified asthma, uncomplicated: Secondary | ICD-10-CM | POA: Diagnosis not present

## 2021-05-27 DIAGNOSIS — Z20822 Contact with and (suspected) exposure to covid-19: Secondary | ICD-10-CM | POA: Diagnosis not present

## 2021-05-27 DIAGNOSIS — E8779 Other fluid overload: Secondary | ICD-10-CM | POA: Diagnosis not present

## 2021-05-27 DIAGNOSIS — E1169 Type 2 diabetes mellitus with other specified complication: Secondary | ICD-10-CM | POA: Diagnosis not present

## 2021-05-27 DIAGNOSIS — I248 Other forms of acute ischemic heart disease: Secondary | ICD-10-CM | POA: Diagnosis not present

## 2021-05-27 DIAGNOSIS — Z79899 Other long term (current) drug therapy: Secondary | ICD-10-CM | POA: Diagnosis not present

## 2021-05-27 DIAGNOSIS — R778 Other specified abnormalities of plasma proteins: Secondary | ICD-10-CM | POA: Diagnosis not present

## 2021-05-27 DIAGNOSIS — R079 Chest pain, unspecified: Secondary | ICD-10-CM | POA: Diagnosis not present

## 2021-05-27 DIAGNOSIS — Z992 Dependence on renal dialysis: Secondary | ICD-10-CM | POA: Diagnosis not present

## 2021-05-27 DIAGNOSIS — I214 Non-ST elevation (NSTEMI) myocardial infarction: Secondary | ICD-10-CM | POA: Diagnosis not present

## 2021-05-27 DIAGNOSIS — I509 Heart failure, unspecified: Secondary | ICD-10-CM | POA: Diagnosis not present

## 2021-05-27 DIAGNOSIS — N250 Renal osteodystrophy: Secondary | ICD-10-CM | POA: Diagnosis not present

## 2021-05-27 DIAGNOSIS — I259 Chronic ischemic heart disease, unspecified: Secondary | ICD-10-CM | POA: Diagnosis not present

## 2021-05-27 DIAGNOSIS — E876 Hypokalemia: Secondary | ICD-10-CM | POA: Diagnosis not present

## 2021-05-27 DIAGNOSIS — N186 End stage renal disease: Secondary | ICD-10-CM | POA: Diagnosis not present

## 2021-05-27 LAB — ECHOCARDIOGRAM COMPLETE
Area-P 1/2: 3.28 cm2
Height: 70 in
S' Lateral: 3.5 cm
Weight: 3837.77 oz

## 2021-05-27 LAB — POCT ACTIVATED CLOTTING TIME
Activated Clotting Time: 287 seconds
Activated Clotting Time: 311 seconds
Activated Clotting Time: 444 seconds
Activated Clotting Time: 233 seconds

## 2021-05-27 LAB — HEPATITIS B SURFACE ANTIBODY,QUALITATIVE: Hep B S Ab: NONREACTIVE

## 2021-05-27 LAB — LIPID PANEL
Cholesterol: 124 mg/dL (ref 0–200)
HDL: 36 mg/dL — ABNORMAL LOW (ref >40)
LDL Cholesterol: 73 mg/dL (ref 0–99)
Total CHOL/HDL Ratio: 3.4 RATIO
Triglycerides: 77 mg/dL (ref <150)
VLDL: 15 mg/dL (ref 0–40)

## 2021-05-27 LAB — RENAL FUNCTION PANEL
Albumin: 2.9 g/dL — ABNORMAL LOW (ref 3.5–5.0)
Anion gap: 17 — ABNORMAL HIGH (ref 5–15)
BUN: 64 mg/dL — ABNORMAL HIGH (ref 8–23)
CO2: 25 mmol/L (ref 22–32)
Calcium: 8.5 mg/dL — ABNORMAL LOW (ref 8.9–10.3)
Chloride: 92 mmol/L — ABNORMAL LOW (ref 98–111)
Creatinine, Ser: 11.26 mg/dL — ABNORMAL HIGH (ref 0.61–1.24)
GFR, Estimated: 4 mL/min — ABNORMAL LOW (ref >60)
Glucose, Bld: 121 mg/dL — ABNORMAL HIGH (ref 70–99)
Phosphorus: 11.2 mg/dL — ABNORMAL HIGH (ref 2.5–4.6)
Potassium: 4 mmol/L (ref 3.5–5.1)
Sodium: 134 mmol/L — ABNORMAL LOW (ref 135–145)

## 2021-05-27 LAB — CBC
HCT: 30.6 % — ABNORMAL LOW (ref 39.0–52.0)
Hemoglobin: 10 g/dL — ABNORMAL LOW (ref 13.0–17.0)
MCH: 29.1 pg (ref 26.0–34.0)
MCHC: 32.7 g/dL (ref 30.0–36.0)
MCV: 89 fL (ref 80.0–100.0)
Platelets: 198 10*3/uL (ref 150–400)
RBC: 3.44 MIL/uL — ABNORMAL LOW (ref 4.22–5.81)
RDW: 14.2 % (ref 11.5–15.5)
WBC: 5.4 10*3/uL (ref 4.0–10.5)
nRBC: 0 % (ref 0.0–0.2)

## 2021-05-27 LAB — GLUCOSE, CAPILLARY: Glucose-Capillary: 68 mg/dL — ABNORMAL LOW (ref 70–99)

## 2021-05-27 LAB — HEPATITIS B SURFACE ANTIGEN: Hepatitis B Surface Ag: NONREACTIVE

## 2021-05-27 MED ORDER — PENTAFLUOROPROP-TETRAFLUOROETH EX AERO: 1.0000 "application " | INHALATION_SPRAY | CUTANEOUS | Status: DC | PRN

## 2021-05-27 MED ORDER — HEPARIN SODIUM (PORCINE) 1000 UNIT/ML DIALYSIS: 1000.0000 [IU] | INTRAMUSCULAR | Status: DC | PRN

## 2021-05-27 MED ORDER — LIDOCAINE HCL (PF) 1 % IJ SOLN: 5.0000 mL | INTRAMUSCULAR | Status: DC | PRN

## 2021-05-27 MED ORDER — SODIUM CHLORIDE 0.9 % IV SOLN: 100.0000 mL | INTRAVENOUS | Status: DC | PRN

## 2021-05-27 MED ORDER — ATORVASTATIN CALCIUM 80 MG PO TABS: 80.0000 mg | ORAL_TABLET | Freq: Every day | ORAL | 0 refills | Status: DC

## 2021-05-27 MED ORDER — ATORVASTATIN CALCIUM 80 MG PO TABS
80.0000 mg | ORAL_TABLET | ORAL | Status: AC
  Filled 2021-05-27: qty 1

## 2021-05-27 MED ORDER — ASPIRIN 81 MG PO CHEW: 81.0000 mg | CHEWABLE_TABLET | ORAL | Status: DC

## 2021-05-27 MED ORDER — SODIUM CHLORIDE 0.9% FLUSH: 3.0000 mL | Freq: Two times a day (BID) | INTRAVENOUS | Status: DC

## 2021-05-27 MED ORDER — NITROGLYCERIN 0.4 MG SL SUBL: 0.4000 mg | SUBLINGUAL_TABLET | SUBLINGUAL | 12 refills | Status: DC | PRN

## 2021-05-27 MED ORDER — CALCITRIOL 0.5 MCG PO CAPS: 2.0000 ug | ORAL_CAPSULE | ORAL | Status: DC

## 2021-05-27 MED ORDER — TICAGRELOR 90 MG PO TABS: 90.0000 mg | ORAL_TABLET | Freq: Two times a day (BID) | ORAL | 0 refills | Status: DC

## 2021-05-27 MED ORDER — ATORVASTATIN CALCIUM 80 MG PO TABS: 80.0000 mg | ORAL_TABLET | Freq: Every day | ORAL | Status: DC

## 2021-05-27 MED ORDER — ASPIRIN 81 MG PO CHEW: 81.0000 mg | CHEWABLE_TABLET | Freq: Every day | ORAL | 11 refills | Status: DC

## 2021-05-27 MED ORDER — ALTEPLASE 2 MG IJ SOLR: 2.0000 mg | Freq: Once | INTRAMUSCULAR | Status: DC | PRN

## 2021-05-27 MED ORDER — SODIUM CHLORIDE 0.9% FLUSH: 3.0000 mL | INTRAVENOUS | Status: DC | PRN

## 2021-05-27 MED ORDER — LIDOCAINE-PRILOCAINE 2.5-2.5 % EX CREA: 1.0000 "application " | TOPICAL_CREAM | CUTANEOUS | Status: DC | PRN

## 2021-05-27 MED ORDER — CARVEDILOL 3.125 MG PO TABS: 3.1250 mg | ORAL_TABLET | Freq: Two times a day (BID) | ORAL | 0 refills | Status: DC

## 2021-05-27 MED ORDER — PANTOPRAZOLE SODIUM 40 MG PO TBEC: 40.0000 mg | DELAYED_RELEASE_TABLET | Freq: Every day | ORAL | 0 refills | Status: DC

## 2021-05-27 MED ORDER — SODIUM CHLORIDE 0.9 % IV SOLN: 250.0000 mL | INTRAVENOUS | Status: DC | PRN

## 2021-05-27 MED FILL — Verapamil HCl IV Soln 2.5 MG/ML: INTRAVENOUS | Qty: 2 | Status: AC

## 2021-05-27 NOTE — Progress Notes (Signed)
Progress Note  Patient Name: [name] [name] Date of Encounter: 05/27/2021  [facility] Cardiologist:  Gasper Sells  Subjective    71 yo with hx of ESRD , CHF  We saw him yesterday for CP and elevated troponins   EF 30-35% . Was started on Coreg 3.125 bid  Had HD this am   Had cath , stenting yesterday of prox LAD  Is doing well   Ready for DC today  Inpatient Medications    Scheduled Meds:  aspirin  81 mg Oral Daily   atorvastatin  80 mg Oral Daily   calcitRIOL  2 mcg Oral Q M,W,F-HD   carvedilol  3.125 mg Oral BID WC   Chlorhexidine Gluconate Cloth  6 each Topical Q0600   enoxaparin (LOVENOX) injection  30 mg Subcutaneous Q24H   insulin aspart  0-15 Units Subcutaneous TID WC   insulin aspart  0-5 Units Subcutaneous QHS   pantoprazole  40 mg Oral Daily   pneumococcal 23 valent vaccine  0.5 mL Intramuscular Tomorrow-1000   sevelamer carbonate  800 mg Oral TID WC   sodium chloride flush  3 mL Intravenous Q12H   ticagrelor  90 mg Oral BID   Continuous Infusions:  sodium chloride     PRN Meds: sodium chloride, acetaminophen, nitroGLYCERIN, ondansetron (ZOFRAN) IV, sodium chloride flush   Vital Signs    Vitals:   05/27/21 1030 05/27/21 1100 05/27/21 1144 05/27/21 1221  BP: 120/67 109/66 (!) 104/58 124/65  Pulse: 82 88 89 98  Resp: _0 Temp:    97.7 F (36.5 C)  TempSrc:    Oral  SpO2:    98%  Weight:      Height:        Intake/Output Summary (Last 24 hours) at 05/27/2021 1245 Last data filed at 05/27/2021 1144 Gross per 24 hour  Intake 0 ml  Output 2000 ml  Net -2000 ml   Last 3 Weights 05/27/2021 05/26/2021 05/26/2021  Weight (lbs) 239 lb 13.8 oz 240 lb 15.4 oz 241 lb 9.6 oz  Weight (kg) 108.8 kg 109.3 kg 109.589 kg      Telemetry  - Personally Reviewed  ECG     - Personally Reviewed  Physical Exam   GEN: No acute distress.   Neck: No JVD Cardiac: RRR, no murmurs, rubs, or gallops.  Respiratory: Clear to auscultation  bilaterally. GI: Soft, nontender, non-distended  MS: No edema; No deformity.  R fem cath site looks good  Neuro:  Nonfocal  Psych: Normal affect   Labs    High Sensitivity Troponin:   Recent Labs  Lab 05/25/21 1545 05/25/21 1818 05/26/21 0109 05/26/21 0403 05/26/21 1053  TROPONINIHS 120* 179* 837* 886* 918*     Chemistry Recent Labs  Lab 05/25/21 1545 05/26/21 1053 05/26/21 2136 05/27/21 0447  NA 132* 135  --  134*  K 3.0* 3.7  --  4.0  CL 91* 92*  --  92*  CO2 27 23  --  25  GLUCOSE 171* 110*  --  121*  BUN 51* 59*  --  64*  CREATININE 8.06* 10.13* 10.98* 11.26*  CALCIUM 8.6* 8.8*  --  8.5*  ALBUMIN  --   --   --  2.9*  GFRNONAA 7* 5* 5* 4*  ANIONGAP 14 20*  --  17*    Lipids  Recent Labs  Lab 05/27/21 0447  CHOL 124  TRIG 77  HDL 36*  LDLCALC 73  CHOLHDL 3.4  Hematology Recent Labs  Lab 05/26/21 1053 05/26/21 2136 05/27/21 0447  WBC 5.4 6.0 5.4  RBC 3.88* 3.90* 3.44*  HGB 11.4* 11.1* 10.0*  HCT 36.6* 34.8* 30.6*  MCV 94.3 89.2 89.0  MCH 29.4 28.5 29.1  MCHC 31.1 31.9 32.7  RDW 14.3 14.1 14.2  PLT 166 188 198   Thyroid No results for input(s): TSH, FREET4 in the last 168 hours.  BNPNo results for input(s): BNP, PROBNP in the last 168 hours.  DDimer No results for input(s): DDIMER in the last 168 hours.   Radiology    DG Chest 2 View  Result Date: 05/25/2021 CLINICAL DATA:  Right chest pain after dialysis today. The patient has been having right chest pain for the past 3 days. EXAM: CHEST - 2 VIEW COMPARISON:  12/23/2020 FINDINGS: Normal sized heart. Mildly tortuous and calcified thoracic aorta. Clear lungs. Mild peribronchial thickening with improvement. No acute bony abnormality. IMPRESSION: Mild bronchitic changes with improvement. Electronically Signed   By: Claudie Revering M.D.   On: 05/25/2021 15:21   CARDIAC CATHETERIZATION  Result Date: 05/26/2021   Prox RCA lesion is 30% stenosed.   Prox LAD to Mid LAD lesion is 30% stenosed.   1st Diag  lesion is 80% stenosed.   Mid LAD lesion is 95% stenosed.   2nd Diag lesion is 85% stenosed.   2nd Sept lesion is 80% stenosed.   A drug-eluting stent was successfully placed.   Post intervention, there is a 0% residual stenosis. Predominant single-vessel coronary artery disease with 30% diffuse irregularity of the proximal LAD with ostial diagonal stenoses in small caliber vessels and 95% focal proximal to mid stenosis with mild luminal irregularity of the mid LAD; normal left circumflex coronary artery, and mild irregularity in the RCA with 25% smooth mid stenosis. LVEDP 10 mmHg Successful PCI to the 95% LAD stenosis with ultimate insertion of a 3.0 x 18 mm Medtronic Onyx frontiers stent postdilated to 3.10 with the stenosis being reduced to oh percent.  There is ostial narrowing of the septal and small caliber diagonal vessel within the stented  segment. RECOMMENDATION: DAPT for minimum of 1 year.  Medical therapy for mild concomitant CAD.  Aggressive lipid-lowering therapy with target LDL less than 55.  The patient undergoes dialysis on Monday Wednesday and Fridays.   US Venous Img Lower Unilateral Left (DVT)  Result Date: 05/26/2021 CLINICAL DATA:  Chronic left lower extremity edema EXAM: LEFT LOWER EXTREMITY VENOUS DOPPLER ULTRASOUND TECHNIQUE: Gray-scale sonography with compression, as well as color and duplex ultrasound, were performed to evaluate the deep venous system(s) from the level of the common femoral vein through the popliteal and proximal calf veins. COMPARISON:  None. FINDINGS: VENOUS Normal compressibility of the common femoral, superficial femoral, and popliteal veins, as well as the visualized calf veins. Visualized portions of profunda femoral vein and great saphenous vein unremarkable. No filling defects to suggest DVT on grayscale or color Doppler imaging. Doppler waveforms show normal direction of venous flow, normal respiratory plasticity and response to augmentation. Limited views of  the contralateral common femoral vein are unremarkable. OTHER None. Limitations: none IMPRESSION: No left lower extremity DVT. Electronically Signed   By: Miachel Roux M.D.   On: 05/26/2021 10:09    Cardiac Studies      Patient Profile     71 y.o. male    Assessment & Plan     1.  NSTEMI:   had PCI of his mid  LAD yesterday  Home on ASA  81 mg a day , brilinta 90 BID  Cont atorvastatin 80 mg a day   2. ESRD:  plans per nephrology    Will follow up in our [place] office with Dr. Suzanne Boron  OK for DC from a cardiac standpoint after he is seen by cardiac rehab.      For questions or updates, please contact [place] Please consult [email or web address] for contact info under        Signed, Mertie Moores, MD  05/27/2021, 12:45 PM

## 2021-05-27 NOTE — Progress Notes (Signed)
CARDIAC REHAB PHASE I   PRE:  Rate/Rhythm: 90 sR    BP: sitting 120/64    SaO2:   MODE:  Ambulation: 430 ft   POST:  Rate/Rhythm: 107 ST with PVCs    BP: sitting 153/64     SaO2:   Tolerated well, no c/o. Increased PVCs. Discussed MI, restrictions, stent, Brilinta importance, exercise, NTG and CRPII. Pt very receptive. He follows RD at HD so no diet done. Pt goes to HD MWF therefore sts he could not do CRPII. Will place referral for [facility] CRPII to meet protocol. [place], ACSM 05/27/2021 2:44 PM

## 2021-05-27 NOTE — Progress Notes (Incomplete)
PROGRESS NOTE    [name] [name]  OXB:[id number] DOB: 1950/10/20 DOA: 05/25/2021 PCP: Neale Burly, MD   Brief Narrative: (Start on day 1 of progress note - keep it brief and live) ***   Assessment & Plan:   Principal Problem:   Chest pain Active Problems:   Hypokalemia   ESRD (end stage renal disease) (HCC)   Elevated troponin   Diabetes mellitus type 2 in nonobese (HCC)   Obesity -Complicates overall prognosis and care -Estimated body mass index is 34.57 kg/m as calculated from the following:   Height as of this encounter: 5\' 10"  (1.778 m).   Weight as of this encounter: 109.3 kg.  -Weight Loss and Dietary Counseling given   DVT prophylaxis: (Lovenox/Heparin/SCD's/anticoagulated/None (if comfort care) Code Status: (Full/Partial - specify details) Family Communication: (Specify name, relationship & date discussed. NO "discussed with patient") Disposition Plan: (specify when and where you expect patient to be discharged). Include barriers to DC in this tab.  Status is: Observation {Observation:23811}          Consultants:  Nephrology Cardiology  Procedures: (Don't include imaging studies which can be auto populated. Include things that cannot be auto populated i.e. Echo, Carotid and venous dopplers, Foley, Bipap, HD, tubes/drains, wound vac, central lines etc) ***  Antimicrobials: (specify start and planned stop date. Auto populated tables are space occupying and do not give end dates) Anti-infectives (From admission, onward)    None        Subjective: ***  Objective: Vitals:   05/27/21 0208 05/27/21 0308 05/27/21 0408 05/27/21 0509  BP: 130/64 125/70 129/65 (!) 106/58  Pulse: 81 68 75 64  Resp:    18  Temp:    97.8 F (36.6 C)  TempSrc:    Oral  SpO2: 98% 97% 96% 98%  Weight:      Height:        Intake/Output Summary (Last 24 hours) at 05/27/2021 0804 Last data filed at 05/27/2021 0400 Gross per 24 hour  Intake 0 ml  Output --  Net 0 ml    Filed Weights   05/26/21 0815 05/26/21 1945  Weight: 109.6 kg 109.3 kg    Examination: Physical Exam:  Constitutional: WN/WD, NAD and appears calm and comfortable Eyes: PERRL, lids and conjunctivae normal, sclerae anicteric  ENMT: External Ears, Nose appear normal. Grossly normal hearing. Mucous membranes are moist. Posterior pharynx clear of any exudate or lesions. Normal dentition.  Neck: Appears normal, supple, no cervical masses, normal ROM, no appreciable thyromegaly Respiratory: Clear to auscultation bilaterally, no wheezing, rales, rhonchi or crackles. Normal respiratory effort and patient is not tachypenic. No accessory muscle use.  Cardiovascular: RRR, no murmurs / rubs / gallops. S1 and S2 auscultated. No extremity edema. 2+ pedal pulses. No carotid bruits.  Abdomen: Soft, non-tender, non-distended. No masses palpated. No appreciable hepatosplenomegaly. Bowel sounds positive.  GU: Deferred. Musculoskeletal: No clubbing / cyanosis of digits/nails. No joint deformity upper and lower extremities. Good ROM, no contractures. Normal strength and muscle tone.  Skin: No rashes, lesions, ulcers. No induration; Warm and dry.  Neurologic: CN 2-12 grossly intact with no focal deficits. Sensation intact in all 4 Extremities, DTR normal. Strength 5/5 in all 4. Romberg sign cerebellar reflexes not assessed.  Psychiatric: Normal judgment and insight. Alert and oriented x 3. Normal mood and appropriate affect.   Data Reviewed: I have personally reviewed following labs and imaging studies  CBC: Recent Labs  Lab 05/25/21 1545 05/26/21 0403 05/26/21 1053 05/26/21 2136 05/27/21 0447  WBC 6.5 6.3 5.4 6.0 5.4  HGB 11.2* 10.2* 11.4* 11.1* 10.0*  HCT 35.0* 32.6* 36.6* 34.8* 30.6*  MCV 91.1 93.4 94.3 89.2 89.0  PLT 231 207 166 188 947   Basic Metabolic Panel: Recent Labs  Lab 05/25/21 1545 05/26/21 1053 05/26/21 2136 05/27/21 0447  NA 132* 135  --  134*  K 3.0* 3.7  --  4.0  CL 91*  92*  --  92*  CO2 27 23  --  25  GLUCOSE 171* 110*  --  121*  BUN 51* 59*  --  64*  CREATININE 8.06* 10.13* 10.98* 11.26*  CALCIUM 8.6* 8.8*  --  8.5*  PHOS  --   --   --  11.2*   GFR: Estimated Creatinine Clearance: 7.4 mL/min (A) (by C-G formula based on SCr of 11.26 mg/dL (H)). Liver Function Tests: Recent Labs  Lab 05/27/21 0447  ALBUMIN 2.9*   No results for input(s): LIPASE, AMYLASE in the last 168 hours. No results for input(s): AMMONIA in the last 168 hours. Coagulation Profile: No results for input(s): INR, PROTIME in the last 168 hours. Cardiac Enzymes: No results for input(s): CKTOTAL, CKMB, CKMBINDEX, TROPONINI in the last 168 hours. BNP (last 3 results) No results for input(s): PROBNP in the last 8760 hours. HbA1C: Recent Labs    05/26/21 0109  HGBA1C 6.7*   CBG: Recent Labs  Lab 05/26/21 0115 05/26/21 0846 05/26/21 1117 05/26/21 1642 05/26/21 2118  GLUCAP 106* 130* 102* 72 85   Lipid Profile: Recent Labs    05/27/21 0447  CHOL 124  HDL 36*  LDLCALC 73  TRIG 77  CHOLHDL 3.4   Thyroid Function Tests: No results for input(s): TSH, T4TOTAL, FREET4, T3FREE, THYROIDAB in the last 72 hours. Anemia Panel: No results for input(s): VITAMINB12, FOLATE, FERRITIN, TIBC, IRON, RETICCTPCT in the last 72 hours. Sepsis Labs: No results for input(s): PROCALCITON, LATICACIDVEN in the last 168 hours.  Recent Results (from the past 240 hour(s))  Resp Panel by RT-PCR (Flu A&B, Covid) Nasopharyngeal Swab     Status: None   Collection Time: 05/25/21  9:10 PM   Specimen: Nasopharyngeal Swab; Nasopharyngeal(NP) swabs in vial transport medium  Result Value Ref Range Status   SARS Coronavirus 2 by RT PCR NEGATIVE NEGATIVE Final    Comment: (NOTE) SARS-CoV-2 target nucleic acids are NOT DETECTED.  The SARS-CoV-2 RNA is generally detectable in upper respiratory specimens during the acute phase of infection. The lowest concentration of SARS-CoV-2 viral copies this  assay can detect is 138 copies/mL. A negative result does not preclude SARS-Cov-2 infection and should not be used as the sole basis for treatment or other patient management decisions. A negative result may occur with  improper specimen collection/handling, submission of specimen other than nasopharyngeal swab, presence of viral mutation(s) within the areas targeted by this assay, and inadequate number of viral copies(<138 copies/mL). A negative result must be combined with clinical observations, patient history, and epidemiological information. The expected result is Negative.  Fact Sheet for Patients:  EntrepreneurPulse.com.au  Fact Sheet for Healthcare Providers:  IncredibleEmployment.be  This test is no t yet approved or cleared by the [place] FDA and  has been authorized for detection and/or diagnosis of SARS-CoV-2 by FDA under an Emergency Use Authorization (EUA). This EUA will remain  in effect (meaning this test can be used) for the duration of the COVID-19 declaration under Section 564(b)(1) of the Act, 21 U.S.C.section 360bbb-3(b)(1), unless the authorization is terminated  or revoked  sooner.       Influenza A by PCR NEGATIVE NEGATIVE Final   Influenza B by PCR NEGATIVE NEGATIVE Final    Comment: (NOTE) The Xpert Xpress SARS-CoV-2/FLU/RSV plus assay is intended as an aid in the diagnosis of influenza from Nasopharyngeal swab specimens and should not be used as a sole basis for treatment. Nasal washings and aspirates are unacceptable for Xpert Xpress SARS-CoV-2/FLU/RSV testing.  Fact Sheet for Patients: EntrepreneurPulse.com.au  Fact Sheet for Healthcare Providers: IncredibleEmployment.be  This test is not yet approved or cleared by the [place] FDA and has been authorized for detection and/or diagnosis of SARS-CoV-2 by FDA under an Emergency Use Authorization (EUA). This EUA will  remain in effect (meaning this test can be used) for the duration of the COVID-19 declaration under Section 564(b)(1) of the Act, 21 U.S.C. section 360bbb-3(b)(1), unless the authorization is terminated or revoked.  Performed at [facility], [address] [place], [place] [address]     RN Pressure Injury Documentation:     Estimated body mass index is 34.57 kg/m as calculated from the following:   Height as of this encounter: 5\' 10"  (1.778 m).   Weight as of this encounter: 109.3 kg.  Malnutrition Type:   Malnutrition Characteristics:   Nutrition Interventions:   Radiology Studies: DG Chest 2 View  Result Date: 05/25/2021 CLINICAL DATA:  Right chest pain after dialysis today. The patient has been having right chest pain for the past 3 days. EXAM: CHEST - 2 VIEW COMPARISON:  12/23/2020 FINDINGS: Normal sized heart. Mildly tortuous and calcified thoracic aorta. Clear lungs. Mild peribronchial thickening with improvement. No acute bony abnormality. IMPRESSION: Mild bronchitic changes with improvement. Electronically Signed   By: Claudie Revering M.D.   On: 05/25/2021 15:21   CARDIAC CATHETERIZATION  Result Date: 05/26/2021   Prox RCA lesion is 30% stenosed.   Prox LAD to Mid LAD lesion is 30% stenosed.   1st Diag lesion is 80% stenosed.   Mid LAD lesion is 95% stenosed.   2nd Diag lesion is 85% stenosed.   2nd Sept lesion is 80% stenosed.   A drug-eluting stent was successfully placed.   Post intervention, there is a 0% residual stenosis. Predominant single-vessel coronary artery disease with 30% diffuse irregularity of the proximal LAD with ostial diagonal stenoses in small caliber vessels and 95% focal proximal to mid stenosis with mild luminal irregularity of the mid LAD; normal left circumflex coronary artery, and mild irregularity in the RCA with 25% smooth mid stenosis. LVEDP 10 mmHg Successful PCI to the 95% LAD stenosis with ultimate insertion of a 3.0 x 18 mm Medtronic Onyx frontiers  stent postdilated to 3.10 with the stenosis being reduced to oh percent.  There is ostial narrowing of the septal and small caliber diagonal vessel within the stented  segment. RECOMMENDATION: DAPT for minimum of 1 year.  Medical therapy for mild concomitant CAD.  Aggressive lipid-lowering therapy with target LDL less than 55.  The patient undergoes dialysis on Monday Wednesday and Fridays.   US Venous Img Lower Unilateral Left (DVT)  Result Date: 05/26/2021 CLINICAL DATA:  Chronic left lower extremity edema EXAM: LEFT LOWER EXTREMITY VENOUS DOPPLER ULTRASOUND TECHNIQUE: Gray-scale sonography with compression, as well as color and duplex ultrasound, were performed to evaluate the deep venous system(s) from the level of the common femoral vein through the popliteal and proximal calf veins. COMPARISON:  None. FINDINGS: VENOUS Normal compressibility of the common femoral, superficial femoral, and popliteal veins, as well as  the visualized calf veins. Visualized portions of profunda femoral vein and great saphenous vein unremarkable. No filling defects to suggest DVT on grayscale or color Doppler imaging. Doppler waveforms show normal direction of venous flow, normal respiratory plasticity and response to augmentation. Limited views of the contralateral common femoral vein are unremarkable. OTHER None. Limitations: none IMPRESSION: No left lower extremity DVT. Electronically Signed   By: Miachel Roux M.D.   On: 05/26/2021 10:09    Scheduled Meds:  aspirin  81 mg Oral Daily   atorvastatin  80 mg Oral Daily   carvedilol  3.125 mg Oral BID WC   Chlorhexidine Gluconate Cloth  6 each Topical Q0600   enoxaparin (LOVENOX) injection  30 mg Subcutaneous Q24H   insulin aspart  0-15 Units Subcutaneous TID WC   insulin aspart  0-5 Units Subcutaneous QHS   pantoprazole  40 mg Oral Daily   pneumococcal 23 valent vaccine  0.5 mL Intramuscular Tomorrow-1000   sevelamer carbonate  800 mg Oral TID WC   sodium chloride  flush  3 mL Intravenous Q12H   ticagrelor  90 mg Oral BID   Continuous Infusions:  sodium chloride     sodium chloride     sodium chloride      LOS: 0 days   Kerney Elbe, DO [facility] PAGER is on AMION  If 7PM-7AM, please contact night-coverage [email or web address]

## 2021-05-27 NOTE — Hospital Course (Signed)
HPI per Dr. Carlynn Purl: [name] [name] is a 71 y.o. male with medical history significant of with history of asthma, CHF, depression, diabetes mellitus type 2, ESRD on hemodialysis Monday Wednesday Friday, presents the ED with a chief complaint of chest pain.  Patient reports that 4 days ago he had a coughing spell and then chest pain afterwards.  During his coughing spell he became very short of breath.  The chest pain remained after the coughing spell was over.  Weekend he had no chest pains.  Today he went to hemodialysis and had no problems.  When he got back from hemodialysis chest pain again.  He did have a cough this time, but the cough is getting better the chest pain remains the same.  He reports the chest pain is worse with exertion and worse with laying back.  It is better when he sits up on the side of the bed.  He reports that his cough has been dry.  He does report is not uncommon for him to have a cough because he has asthma.  His chest pain is located lateral to the left midclavicular line.  He feels like a squeezing sensation with no radiation.  It last 5 to 10 minutes.  Patient has never had anything like this before.  He does report that in July he was diagnosed with CHF.  Patient reports that at the time of my interview he is chest pain-free.  ED physician called and spoke with on-call cardiology who recommended hobs overnight and inpatient cardio consult in the a.m.   On exam patient had left lower extremity edema.  He reports that its not uncommon for his legs to become edematous, and usually goes away with dialysis.  He had not noticed that his left leg has remained edematous.  He does not know how long this has been going on.   Patient does not smoke, does not drink alcohol, does not use illicit drugs.  Patient is vaccinated for COVID.  Patient is full code.  **Interim History  He presented for his chest pain and dyspnea and cardiology was consulted-Presented for his and they  were concerned with his findings so they recommended transfer to [facility] for further evaluation and cardiac catheterization.  Patient underwent cardiac catheterization and then subsequently ended up getting a single stent placed to his mid LAD was placed on dual antiplatelet therapy.  Subsequently after his chest pain resolved and he underwent dialysis.  After dialysis he was deemed medically stable to be discharged home and he was initiated on aspirin, Brilinta and atorvastatin and was told to follow-up with his PCP and cardiology in the outpatient setting.

## 2021-05-27 NOTE — Care Management Obs Status (Signed)
[place] NOTIFICATION   Patient Details  Name: [name] [name] MRN: [id number] Date of Birth: November 20, 1950   Medicare Observation Status Notification Given:  Yes    Carles Collet, RN 05/27/2021, 7:55 AM

## 2021-05-27 NOTE — Discharge Summary (Addendum)
Physician Discharge Summary   Patient: [name] [name] MRN: [id number] DOB: 1950-11-28  Admit date:     05/25/2021  Discharge date: 05/27/21  Discharge Physician: Kerney Elbe   PCP: Neale Burly, MD   Recommendations at discharge:  Follow-up with cardiology for further cardiac care given stent placement; "continue dual antiplatelet therapy for at least a year Follow-up with PCP within 1 to 2 weeks and have PCP further address blood sugar management given his hemoglobin A1c of six-point  Discharge Diagnoses: Principal Problem:   NSTEMI (non-ST elevated myocardial infarction) ([place]) Active Problems:   Hypokalemia   ESRD (end stage renal disease) ([place])   Elevated troponin   Diabetes mellitus type 2 in obese (HCC)   Obesity (BMI 30-39.9)   Anemia of chronic disease  Resolved Problems:   * No resolved hospital problems. [facility] Course: HPI per Dr. Carlynn Purl: [name] [name] is a 71 y.o. male with medical history significant of with history of asthma, CHF, depression, diabetes mellitus type 2, ESRD on hemodialysis Monday Wednesday Friday, presents the ED with a chief complaint of chest pain.  Patient reports that 4 days ago he had a coughing spell and then chest pain afterwards.  During his coughing spell he became very short of breath.  The chest pain remained after the coughing spell was over.  Weekend he had no chest pains.  Today he went to hemodialysis and had no problems.  When he got back from hemodialysis chest pain again.  He did have a cough this time, but the cough is getting better the chest pain remains the same.  He reports the chest pain is worse with exertion and worse with laying back.  It is better when he sits up on the side of the bed.  He reports that his cough has been dry.  He does report is not uncommon for him to have a cough because he has asthma.  His chest pain is located lateral to the left midclavicular line.  He feels like a squeezing  sensation with no radiation.  It last 5 to 10 minutes.  Patient has never had anything like this before.  He does report that in July he was diagnosed with CHF.  Patient reports that at the time of my interview he is chest pain-free.  ED physician called and spoke with on-call cardiology who recommended hobs overnight and inpatient cardio consult in the a.m.   On exam patient had left lower extremity edema.  He reports that its not uncommon for his legs to become edematous, and usually goes away with dialysis.  He had not noticed that his left leg has remained edematous.  He does not know how long this has been going on.   Patient does not smoke, does not drink alcohol, does not use illicit drugs.  Patient is vaccinated for COVID.  Patient is full code.  **Interim History  He presented for his chest pain and dyspnea and cardiology was consulted-Presented for his and they were concerned with his findings so they recommended transfer to [facility] for further evaluation and cardiac catheterization.  Patient underwent cardiac catheterization and then subsequently ended up getting a single stent placed to his mid LAD was placed on dual antiplatelet therapy.  Subsequently after his chest pain resolved and he underwent dialysis.  After dialysis he was deemed medically stable to be discharged home and he was initiated on aspirin, Brilinta and atorvastatin and was told to follow-up with his PCP  and cardiology in the outpatient setting.  Assessment and Plan: * NSTEMI (non-ST elevated myocardial infarction) ([place])- (present on admission) EKG was without ischemic changes -Troponin up trending from 120 --> 179, continue to cycle Troponin and went up to 918 -Pain is atypical - worse laying down, better sitting on edge of bed, lateral to midclavicular line -Repeat EKG for any further chest pain Patient is high risk and was admitted to telemetry.  Cardiology evaluated and felt that he needed an ischemic evaluation  given the concern he was transferred to [facility] for a catheterization yesterday afternoon. -His echo was reviewed and showed an LVEF of 35 to 40% back in 2022 in September -He was initiated on low-dose carvedilol 3.125 mg p.o. twice daily and a statin was added -Cardiac catheterization was done and showed a predominant single-vessel coronary artery disease with 30% diffuse irregularity of the proximal LAD with ostial diagonal stenosis and small collateral vessels and 95% focal proximal to mid stenosis with mild luminal irregularity in the mid LAD.  He underwent successful PCI to the 95% LAD stenosis with a 3.0 x 18 mm Medtronic Onyx from 2-year stent and the recommendation was for dual antiplatelet therapy for 1 year as well as aggressive lipid-lowering target with an LDL of less than 55 -Audiology recommended continuing atorvastatin 80 mg p.o. daily, aspirin 81 mg p.o. daily, Brilinta 90 mg p.o. twice daily as well as carvedilol 3.125 mg p.o. twice daily -Patient to follow-up with cardiology in [place] -Repeat ECHO prior to D/C showed:  "Mild hypokinesis of the mid anteroseptal/inferoseptal wall. Left ventricular ejection fraction, by estimation, is 50 to 55%. The left ventricle has low normal function. The left ventricle has no regional wall  motion abnormalities. Left ventricular  diastolic parameters are consistent with Grade I diastolic dysfunction (impaired relaxation). Right ventricular systolic function is normal. The right ventricular size is normal. No evidence of mitral valve regurgitation.  Aortic valve regurgitation is mild. The inferior vena cava is normal in size with greater than 50% respiratory variability, suggesting right atrial pressure of 3 mmHg."  Anemia of chronic disease -Anemia of chronic kidney disease -Patient's hemoglobin/hematocrit is relatively stable and went from 11.1/34.8 is now 10.0/30.6 -Continue monitor and trend and repeat CBC in outpatient  setting   Obesity (BMI 95-63.8) -Complicates overall prognosis and care -Estimated body mass index is 34.42 kg/m as calculated from the following:   Height as of this encounter: '5\' 10"'  (1.778 m).   Weight as of this encounter: 108.8 kg.  -Weight Loss and Dietary Counseling given   Diabetes mellitus type 2 in obese (HCC) -Was initiated on sliding scale coverage while hospitalized -[place] A1c was 6.7 -No diabetes medications on med list but he will need PCP follow-up for further blood sugar management -CBGs ranged from 68-130  Elevated troponin- (present on admission) -Troponin initially 120, increasing to 179 and then trended up to 918 -Troponins were elevated in the setting of NSTEMI ESRD is likely contributing to the elevation in troponin given his symptoms cardiology was consulted and recommended transfer to [facility] for cardiac catheterization -He was monitored on telemetry and cardiology made further recommendations   ESRD (end stage renal disease) ([place])- (present on admission) - Patient undergoes hemodialysis Monday Wednesday Friday with his last hemodialysis session on 05/25/2021 he was dialyzed today prior to discharge and his BUN/creatinine prior discharge was 64/11.26 -He has also had a mild hyponatremia this morning which would likely be corrected and elevated anion gap as well  as an elevated phosphorus level of 11.2 -Nephrology was consulted and recommended continuing Renvela and reordering calcitriol -They deferred his ESA today and felt.  No indication for acute transfusion of PRBCs. -Continue regularly scheduled dialysis sessions  Hypokalemia- (present on admission) -ESRD patient -Nephrology was consulted and he underwent treatment per their protocol and discharge potassium was 4.0 -Continue to monitor and trend and repeat CMP within 1 week   Pain control - [place] was reviewed. and patient was instructed, not to  drive, operate heavy machinery, perform activities at heights, swimming or participation in water activities or provide baby-sitting services while on Pain, Sleep and Anxiety Medications; until their outpatient Physician has advised to do so again. Also recommended to not to take more than prescribed Pain, Sleep and Anxiety Medications.   Consultants: Cardiology Procedures performed: Cardiac catheterization Disposition: Home Diet recommendation:  Discharge Diet Orders (From admission, onward)     Start     Ordered   05/27/21 0000  Diet - low sodium heart healthy       Comments: Renal Carb Modified Diet with 1200 mL Fluid Restriction   05/27/21 1326           Cardiac and Carb modified diet  DISCHARGE MEDICATION: Allergies as of 05/27/2021   No Known Allergies      Medication List     TAKE these medications    albuterol 108 (90 Base) MCG/ACT inhaler Commonly known as: VENTOLIN HFA INHALE 2 PUFFS BY MOUTH EVERY 4 HOURS AS NEEDED ONLY  IF  YOU  CANT  CATCH  YOUR  BREATH   allopurinol 300 MG tablet Commonly known as: ZYLOPRIM Take 300 mg by mouth daily as needed (gout).   aspirin 81 MG chewable tablet Chew 1 tablet (81 mg total) by mouth daily.   atorvastatin 80 MG tablet Commonly known as: LIPITOR Take 1 tablet (80 mg total) by mouth daily.   carvedilol 3.125 MG tablet Commonly known as: COREG Take 1 tablet (3.125 mg total) by mouth 2 (two) times daily with a meal.   nitroGLYCERIN 0.4 MG SL tablet Commonly known as: NITROSTAT Place 1 tablet (0.4 mg total) under the tongue every 5 (five) minutes as needed for chest pain.   pantoprazole 40 MG tablet Commonly known as: PROTONIX Take 1 tablet (40 mg total) by mouth daily.   sevelamer 800 MG tablet Commonly known as: RENAGEL Take 800 mg by mouth 3 (three) times daily with meals.   ticagrelor 90 MG Tabs tablet Commonly known as: BRILINTA Take 1 tablet (90 mg total) by mouth 2 (two) times daily.         Follow-up Information     [facility] Follow up.   Specialty: Cardiology Why: Hospital follow-up with Dr. Gasper Sells scheduled for 06/17/2021 at 9:20am. Please arrive 15 minutes early for check-in. If this date/time does not work for your, please call our office to reschedule. Contact information: [facility] [phone]                Discharge Exam: Danley Danker Weights   05/26/21 0815 05/26/21 1945 05/27/21 0750  Weight: 109.6 kg 109.3 kg 108.8 kg   Examination: Physical Exam:  Constitutional: WN/WD morbidly obese African-American male currently in NAD and appears calm and comfortable Eyes: Lids and conjunctivae normal, sclerae anicteric  ENMT: External Ears, Nose appear normal. Grossly normal hearing. Mucous membranes are moist. Neck: Appears normal, supple, no cervical masses, normal ROM,  no appreciable thyromegaly; no appreciable JVD Respiratory: Diminished to auscultation bilaterally, no wheezing, rales, rhonchi or crackles. Normal respiratory effort and patient is not tachypenic. No accessory muscle use.  Unlabored breathing Cardiovascular: RRR, no murmurs / rubs / gallops. S1 and S2 auscultated.  Trace extremity edema Abdomen: Soft, non-tender, distended secondary body habitus. Bowel sounds positive.  GU: Deferred. Musculoskeletal: No clubbing / cyanosis of digits/nails. No joint deformity upper and lower extremities but has a left upper extremity AV fistula Skin: No rashes, lesions, ulcers on limited skin. No induration; Warm and dry.  Neurologic: CN 2-12 grossly intact with no focal deficits. Romberg sign and cerebellar reflexes not assessed.  Psychiatric: Normal judgment and insight. Alert and oriented x 3. Normal mood and appropriate affect.   Condition at discharge: stable  The results of significant diagnostics from this hospitalization (including imaging, microbiology, ancillary and laboratory) are listed below for  reference.   Imaging Studies: DG Chest 2 View  Result Date: 05/25/2021 CLINICAL DATA:  Right chest pain after dialysis today. The patient has been having right chest pain for the past 3 days. EXAM: CHEST - 2 VIEW COMPARISON:  12/23/2020 FINDINGS: Normal sized heart. Mildly tortuous and calcified thoracic aorta. Clear lungs. Mild peribronchial thickening with improvement. No acute bony abnormality. IMPRESSION: Mild bronchitic changes with improvement. Electronically Signed   By: Claudie Revering M.D.   On: 05/25/2021 15:21   CARDIAC CATHETERIZATION  Result Date: 05/26/2021   Prox RCA lesion is 30% stenosed.   Prox LAD to Mid LAD lesion is 30% stenosed.   1st Diag lesion is 80% stenosed.   Mid LAD lesion is 95% stenosed.   2nd Diag lesion is 85% stenosed.   2nd Sept lesion is 80% stenosed.   A drug-eluting stent was successfully placed.   Post intervention, there is a 0% residual stenosis. Predominant single-vessel coronary artery disease with 30% diffuse irregularity of the proximal LAD with ostial diagonal stenoses in small caliber vessels and 95% focal proximal to mid stenosis with mild luminal irregularity of the mid LAD; normal left circumflex coronary artery, and mild irregularity in the RCA with 25% smooth mid stenosis. LVEDP 10 mmHg Successful PCI to the 95% LAD stenosis with ultimate insertion of a 3.0 x 18 mm Medtronic Onyx frontiers stent postdilated to 3.10 with the stenosis being reduced to oh percent.  There is ostial narrowing of the septal and small caliber diagonal vessel within the stented  segment. RECOMMENDATION: DAPT for minimum of 1 year.  Medical therapy for mild concomitant CAD.  Aggressive lipid-lowering therapy with target LDL less than 55.  The patient undergoes dialysis on Monday Wednesday and Fridays.   US Venous Img Lower Unilateral Left (DVT)  Result Date: 05/26/2021 CLINICAL DATA:  Chronic left lower extremity edema EXAM: LEFT LOWER EXTREMITY VENOUS DOPPLER ULTRASOUND TECHNIQUE:  Gray-scale sonography with compression, as well as color and duplex ultrasound, were performed to evaluate the deep venous system(s) from the level of the common femoral vein through the popliteal and proximal calf veins. COMPARISON:  None. FINDINGS: VENOUS Normal compressibility of the common femoral, superficial femoral, and popliteal veins, as well as the visualized calf veins. Visualized portions of profunda femoral vein and great saphenous vein unremarkable. No filling defects to suggest DVT on grayscale or color Doppler imaging. Doppler waveforms show normal direction of venous flow, normal respiratory plasticity and response to augmentation. Limited views of the contralateral common femoral vein are unremarkable. OTHER None. Limitations: none IMPRESSION: No left lower extremity DVT. Electronically  Signed   By: Miachel Roux M.D.   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 FINDINGS: No suspicious focal bony lesions are identified. Moderate degenerative changes in the LOWER lumbar spine are present. Mild degenerative changes in the hips are noted. Bilateral knee chondrocalcinosis identified. No other significant abnormalities are noted. IMPRESSION: 1. No suspicious focal bony lesions. Electronically Signed   By: Margarette Canada M.D.   On: 05/23/2021 21:00   ECHOCARDIOGRAM COMPLETE  Result Date: 05/27/2021    ECHOCARDIOGRAM REPORT   Patient Name:   Markees Stamey Date of Exam: 05/27/2021 Medical Rec #:  [id number]      Height:       70.0 in Accession #:    [id number]     Weight:       239.9 lb Date of Birth:  Apr 26, 1950     BSA:          2.255 m Patient Age:    24 years       BP:           104/58 mmHg Patient Gender: M              HR:           87 bpm. Exam Location:  Inpatient Procedure: 2D Echo, Color Doppler and Cardiac Doppler Indications:    Acute Ischemic Heart Disease i24.9  History:         Patient has prior history of Echocardiogram examinations, most                 recent 01/15/2021.  Sonographer:    Raquel Sarna Senior RDCS Referring Phys: [place]  1. Mild hypokinesis of the mid anteroseptal/inferoseptal wall. Left ventricular ejection fraction, by estimation, is 50 to 55%. The left ventricle has low normal function. The left ventricle has no regional wall motion abnormalities. Left ventricular diastolic parameters are consistent with Grade I diastolic dysfunction (impaired relaxation).  2. Right ventricular systolic function is normal. The right ventricular size is normal.  3. No evidence of mitral valve regurgitation.  4. Aortic valve regurgitation is mild.  5. The inferior vena cava is normal in size with greater than 50% respiratory variability, suggesting right atrial pressure of 3 mmHg. Conclusion(s)/Recommendation(s): Compared to prior echo 01/15/2021, EF has improved. FINDINGS  Left Ventricle: Mild hypokinesis of the mid anteroseptal/inferoseptal wall. Left ventricular ejection fraction, by estimation, is 50 to 55%. The left ventricle has low normal function. The left ventricle has no regional wall motion abnormalities. The left ventricular internal cavity size was normal in size. There is no left ventricular hypertrophy. Left ventricular diastolic parameters are consistent with Grade I diastolic dysfunction (impaired relaxation). Right Ventricle: The right ventricular size is normal. No increase in right ventricular wall thickness. Right ventricular systolic function is normal. Left Atrium: Left atrial size was normal in size. Right Atrium: Right atrial size was normal in size. Pericardium: There is no evidence of pericardial effusion. Mitral Valve: There is mild calcification of the mitral valve leaflet(s). No evidence of mitral valve regurgitation. Tricuspid Valve: The tricuspid valve is grossly normal. Tricuspid valve regurgitation is not demonstrated. Aortic Valve: Aortic  valve regurgitation is mild. Pulmonic Valve: The pulmonic valve was not well visualized. Aorta: Aortic root could not be assessed. Venous: The inferior vena cava is normal in size with greater than 50% respiratory variability, suggesting right atrial pressure  of 3 mmHg. IAS/Shunts: No atrial level shunt detected by color flow Doppler.  LEFT VENTRICLE PLAX 2D LVIDd:         4.90 cm   Diastology LVIDs:         3.50 cm   LV e' medial:    6.31 cm/s LV PW:         1.20 cm   LV E/e' medial:  10.8 LV IVS:        0.90 cm   LV e' lateral:   10.20 cm/s LVOT diam:     2.30 cm   LV E/e' lateral: 6.7 LV SV:         104 LV SV Index:   46 LVOT Area:     4.15 cm  RIGHT VENTRICLE RV S prime:     22.90 cm/s TAPSE (M-mode): 2.7 cm LEFT ATRIUM           Index        RIGHT ATRIUM           Index LA diam:      3.50 cm 1.55 cm/m   RA Area:     16.20 cm LA Vol (A2C): 72.3 ml 32.06 ml/m  RA Volume:   35.00 ml  15.52 ml/m LA Vol (A4C): 41.3 ml 18.32 ml/m  AORTIC VALVE LVOT Vmax:   141.00 cm/s LVOT Vmean:  105.000 cm/s LVOT VTI:    0.251 m  AORTA Ao Root diam: 3.30 cm MITRAL VALVE MV Area (PHT): 3.28 cm     SHUNTS MV Decel Time: 231 msec     Systemic VTI:  0.25 m MV E velocity: 68.30 cm/s   Systemic Diam: 2.30 cm MV A velocity: 118.00 cm/s MV E/A ratio:  0.58 Landscape architect signed by Phineas Inches Signature Date/Time: 05/27/2021/4:38:26 PM    Final     Microbiology: Results for orders placed or performed during the hospital encounter of 05/25/21  Resp Panel by RT-PCR (Flu A&B, Covid) Nasopharyngeal Swab     Status: None   Collection Time: 05/25/21  9:10 PM   Specimen: Nasopharyngeal Swab; Nasopharyngeal(NP) swabs in vial transport medium  Result Value Ref Range Status   SARS Coronavirus 2 by RT PCR NEGATIVE NEGATIVE Final    Comment: (NOTE) SARS-CoV-2 target nucleic acids are NOT DETECTED.  The SARS-CoV-2 RNA is generally detectable in upper respiratory specimens during the acute phase of infection. The  lowest concentration of SARS-CoV-2 viral copies this assay can detect is 138 copies/mL. A negative result does not preclude SARS-Cov-2 infection and should not be used as the sole basis for treatment or other patient management decisions. A negative result may occur with  improper specimen collection/handling, submission of specimen other than nasopharyngeal swab, presence of viral mutation(s) within the areas targeted by this assay, and inadequate number of viral copies(<138 copies/mL). A negative result must be combined with clinical observations, patient history, and epidemiological information. The expected result is Negative.  Fact Sheet for Patients:  EntrepreneurPulse.com.au  Fact Sheet for Healthcare Providers:  IncredibleEmployment.be  This test is no t yet approved or cleared by the [place] FDA and  has been authorized for detection and/or diagnosis of SARS-CoV-2 by FDA under an Emergency Use Authorization (EUA). This EUA will remain  in effect (meaning this test can be used) for the duration of the COVID-19 declaration under Section 564(b)(1) of the Act, 21 U.S.C.section 360bbb-3(b)(1), unless the authorization is terminated  or revoked sooner.       Influenza  A by PCR NEGATIVE NEGATIVE Final   Influenza B by PCR NEGATIVE NEGATIVE Final    Comment: (NOTE) The Xpert Xpress SARS-CoV-2/FLU/RSV plus assay is intended as an aid in the diagnosis of influenza from Nasopharyngeal swab specimens and should not be used as a sole basis for treatment. Nasal washings and aspirates are unacceptable for Xpert Xpress SARS-CoV-2/FLU/RSV testing.  Fact Sheet for Patients: EntrepreneurPulse.com.au  Fact Sheet for Healthcare Providers: IncredibleEmployment.be  This test is not yet approved or cleared by the [place] FDA and has been authorized for detection and/or diagnosis of SARS-CoV-2 by FDA under  an Emergency Use Authorization (EUA). This EUA will remain in effect (meaning this test can be used) for the duration of the COVID-19 declaration under Section 564(b)(1) of the Act, 21 U.S.C. section 360bbb-3(b)(1), unless the authorization is terminated or revoked.  Performed at [facility], [address] [place], [place] [address]     Labs: CBC: Recent Labs  Lab 05/25/21 1545 05/26/21 0403 05/26/21 1053 05/26/21 2136 05/27/21 0447  WBC 6.5 6.3 5.4 6.0 5.4  HGB 11.2* 10.2* 11.4* 11.1* 10.0*  HCT 35.0* 32.6* 36.6* 34.8* 30.6*  MCV 91.1 93.4 94.3 89.2 89.0  PLT 231 207 166 188 303   Basic Metabolic Panel: Recent Labs  Lab 05/25/21 1545 05/26/21 1053 05/26/21 2136 05/27/21 0447  NA 132* 135  --  134*  K 3.0* 3.7  --  4.0  CL 91* 92*  --  92*  CO2 27 23  --  25  GLUCOSE 171* 110*  --  121*  BUN 51* 59*  --  64*  CREATININE 8.06* 10.13* 10.98* 11.26*  CALCIUM 8.6* 8.8*  --  8.5*  PHOS  --   --   --  11.2*   Liver Function Tests: Recent Labs  Lab 05/27/21 0447  ALBUMIN 2.9*   CBG: Recent Labs  Lab 05/26/21 0846 05/26/21 1117 05/26/21 1642 05/26/21 2118 05/27/21 1215  GLUCAP 130* 102* 72 85 68*    Discharge time spent: greater than 30 minutes.  Signed: Raiford Noble, DO [facility] 05/27/2021

## 2021-05-27 NOTE — Progress Notes (Signed)
Echocardiogram 2D Echocardiogram has been performed.  Oneal Deputy Star Resler RDCS 05/27/2021, 1:54 PM

## 2021-05-27 NOTE — Progress Notes (Signed)
[facility] Progress Note  Name: [name] [name] MRN: [id number] DOB: 1951-01-13  Chief Complaint:  Chest pain  Subjective:   s/p heart cath on 2/7 with stent to LAD.  He is obs. He feels so much better. States that he was told he may go home after 1pm. Seen and examined on dialysis.  Blood pressure 111/71 and HR 88. Tolerating goal.  Left AVF in use.   Review of systems:  Denies chest pain - resolved now  Denies shortness of breath  Nausea overnight but resolved now ------------  Background on consult:  [name] [name] is an 71 y.o. male with DM, HTN, MGUS and now ESRD since September of 2022-  HD at [facility] MWF-  did have HD yesterday-  he said after HD he felt well-  then woke up in the middle of the night with chest pain -  came to ER at [facility]-  has been admitted-  now with elevated troponin and cards has seen-  plan is for him to be transferred to [facility] for a heart cath-  he feels better at present-  as above completed HD yesterday without incident      Intake/Output Summary (Last 24 hours) at 05/27/2021 0926 Last data filed at 05/27/2021 0400 Gross per 24 hour  Intake 0 ml  Output --  Net 0 ml    Vitals:  Vitals:   05/27/21 0750 05/27/21 0813 05/27/21 0830 05/27/21 0900  BP: 139/69 137/64 123/68 126/70  Pulse: 69 76 68 68  Resp: 16 15 18 15   Temp: (!) 97.1 F (36.2 C)     TempSrc: Temporal     SpO2:      Weight: 108.8 kg     Height:         Physical Exam:  General adult male in bed in no acute distress HEENT normocephalic atraumatic extraocular movements intact sclera anicteric Neck supple trachea midline Lungs clear to auscultation bilaterally normal work of breathing at rest  Heart S1S2 no rub Abdomen soft nontender nondistended Extremities no edema  Psych normal mood and affect Neuro - alert and oriented x3 provides hx and follows commands Access LUE AVF in use  Medications reviewed   Labs:  BMP Latest Ref Rng & Units 05/27/2021  05/26/2021 05/26/2021  Glucose 70 - 99 mg/dL 121(H) - 110(H)  BUN 8 - 23 mg/dL 64(H) - 59(H)  Creatinine 0.61 - 1.24 mg/dL 11.26(H) 10.98(H) 10.13(H)  Sodium 135 - 145 mmol/L 134(L) - 135  Potassium 3.5 - 5.1 mmol/L 4.0 - 3.7  Chloride 98 - 111 mmol/L 92(L) - 92(L)  CO2 22 - 32 mmol/L 25 - 23  Calcium 8.9 - 10.3 mg/dL 8.5(L) - 8.8(L)    outpatient HD orders:  Dialyzes at [facility]  MWF 4 hours EDW 107.5. HD Bath 1/2.5, Dialyzer unknown, Heparin yes-  2600 load and 600 per hour. Access AVF-  BFR 400.  Mircera 75 q 2 weeks and calcitriol 2 mgc TIW  Assessment/Plan:   1 Chest pain -  s/p cath with stent on 2/7. Appreciate cardiology   2 ESRD: continue HD per MWF schedule. Normally at [facility] 3 Hypertension: controlled on current regimen  4. Anemia of ESRD: no acute indication for PRBC. Defer ESA today.  5. Metabolic Bone Disease: on renvela. Reorder calcitriol  6. hyperkalemia-  outpatient is on 1 K per charting.  Controlled here.  Would consider 2K outpatient.   Disposition - per primary team and cardiology   Cecille Rubin  Wellington Hampshire, MD 05/27/2021 9:40 AM

## 2021-05-27 NOTE — Plan of Care (Signed)

## 2021-05-27 NOTE — Assessment & Plan Note (Signed)
-  Complicates overall prognosis and care -Estimated body mass index is 34.42 kg/m as calculated from the following:   Height as of this encounter: 5\' 10"  (1.778 m).   Weight as of this encounter: 108.8 kg.  -Weight Loss and Dietary Counseling given

## 2021-05-27 NOTE — Assessment & Plan Note (Signed)
-  Anemia of chronic kidney disease -Patient's hemoglobin/hematocrit is relatively stable and went from 11.1/34.8 is now 10.0/30.6 -Continue monitor and trend and repeat CBC in outpatient setting

## 2021-05-28 LAB — HEPATITIS B SURFACE ANTIBODY, QUANTITATIVE: Hep B S AB Quant (Post): <3.1 m[IU]/mL — ABNORMAL LOW (ref >9.9)

## 2021-06-16 DIAGNOSIS — N186 End stage renal disease: Secondary | ICD-10-CM | POA: Diagnosis not present

## 2021-06-16 DIAGNOSIS — Z992 Dependence on renal dialysis: Secondary | ICD-10-CM | POA: Diagnosis not present

## 2021-06-17 ENCOUNTER — Ambulatory Visit: Payer: PPO | Admitting: Internal Medicine

## 2021-06-17 DIAGNOSIS — Z992 Dependence on renal dialysis: Secondary | ICD-10-CM | POA: Diagnosis not present

## 2021-06-17 DIAGNOSIS — D631 Anemia in chronic kidney disease: Secondary | ICD-10-CM | POA: Diagnosis not present

## 2021-06-17 DIAGNOSIS — D509 Iron deficiency anemia, unspecified: Secondary | ICD-10-CM | POA: Diagnosis not present

## 2021-06-17 DIAGNOSIS — Z23 Encounter for immunization: Secondary | ICD-10-CM | POA: Diagnosis not present

## 2021-06-17 DIAGNOSIS — N186 End stage renal disease: Secondary | ICD-10-CM | POA: Diagnosis not present

## 2021-06-23 ENCOUNTER — Ambulatory Visit (INDEPENDENT_AMBULATORY_CARE_PROVIDER_SITE_OTHER): Payer: PPO | Admitting: Internal Medicine

## 2021-06-23 ENCOUNTER — Encounter: Payer: Self-pay | Admitting: *Deleted

## 2021-06-23 ENCOUNTER — Encounter: Payer: Self-pay | Admitting: Internal Medicine

## 2021-06-23 ENCOUNTER — Other Ambulatory Visit: Payer: Self-pay

## 2021-06-23 VITALS — BP 122/72 | HR 82 | Resp 18 | Ht 72.0 in | Wt 236.6 lb

## 2021-06-23 DIAGNOSIS — I25118 Atherosclerotic heart disease of native coronary artery with other forms of angina pectoris: Secondary | ICD-10-CM

## 2021-06-23 DIAGNOSIS — N186 End stage renal disease: Secondary | ICD-10-CM

## 2021-06-23 DIAGNOSIS — I10 Essential (primary) hypertension: Secondary | ICD-10-CM | POA: Diagnosis not present

## 2021-06-23 DIAGNOSIS — E1169 Type 2 diabetes mellitus with other specified complication: Secondary | ICD-10-CM | POA: Diagnosis not present

## 2021-06-23 DIAGNOSIS — K219 Gastro-esophageal reflux disease without esophagitis: Secondary | ICD-10-CM | POA: Diagnosis not present

## 2021-06-23 DIAGNOSIS — J4530 Mild persistent asthma, uncomplicated: Secondary | ICD-10-CM

## 2021-06-23 DIAGNOSIS — I2510 Atherosclerotic heart disease of native coronary artery without angina pectoris: Secondary | ICD-10-CM | POA: Insufficient documentation

## 2021-06-23 DIAGNOSIS — E669 Obesity, unspecified: Secondary | ICD-10-CM | POA: Diagnosis not present

## 2021-06-23 NOTE — Patient Instructions (Signed)
Please continue to take medications as prescribed. ? ?Please continue to follow renal diet as advised by dialysis center. ? ?Please check with your Nephrologist about Allopurinol. ?

## 2021-06-26 ENCOUNTER — Ambulatory Visit: Payer: PPO | Admitting: Internal Medicine

## 2021-06-26 DIAGNOSIS — K219 Gastro-esophageal reflux disease without esophagitis: Secondary | ICD-10-CM | POA: Insufficient documentation

## 2021-06-26 NOTE — Assessment & Plan Note (Signed)
On HD - MWF ?On sevelamer ?

## 2021-06-26 NOTE — Assessment & Plan Note (Signed)
Well controlled with pantoprazole 

## 2021-06-26 NOTE — Assessment & Plan Note (Signed)
Well controlled with albuterol as needed ?

## 2021-06-26 NOTE — Assessment & Plan Note (Signed)
S/p stent placement in 2022 On DAPT with aspirin and Brilinta, on statin Denies any anginal chest pain or dyspnea currently Followed by cardiology 

## 2021-06-26 NOTE — Progress Notes (Signed)
New Patient Office Visit  Subjective:  Patient ID: [name] [name], male    DOB: 1950/10/13  Age: 71 y.o. MRN: [id number]  CC:  Chief Complaint  Patient presents with   New Patient (Initial Visit)    New patient was being seen by dr Sherrie Sport in [place] has had cough for 3 weeks 3 weeks ago had stent put in heart     HPI [name] [name] is a 71 y.o. male with past medical history of CAD s/p stent placement, ESRD on HD, type 2 DM, HTN and asthma who presents for establishing care.  CAD and HTN: He had stent placement in 12/22, and is currently on DAPT and statin.  He denies any chest pain, dyspnea or palpitations currently.  His BP is well controlled with Coreg.  He also takes statin for HLD.  Type II DM: He was on insulin regimen in the past, but is currently diet controlled since he has lost about 100 pounds and is on HD for ESRD now.  He denies any fatigue, polyuria or polydipsia currently.  He has AV fistula for HD.  He denies any urinary complaint currently.  He takes pantoprazole for GERD.  Denies any dysphagia or odynophagia currently.  He also has history of asthma, for which she uses albuterol inhaler as needed for dyspnea or wheezing.  He has had COVID and pneumococcal vaccines.  Past Medical History:  Diagnosis Date   Asthma    CHF (congestive heart failure) ([place])    Depression    Diabetes mellitus without complication ([place])    Dyspnea    Stage 4 chronic kidney disease ([place])     Past Surgical History:  Procedure Laterality Date   ABCESS DRAINAGE     on back - possible spider bite   AV FISTULA PLACEMENT Left 12/23/2020   Procedure: LEFT ARM ARTERIOVENOUS (AV) FISTULA CREATION;  Surgeon: Rosetta Posner, MD;  Location: [facility];  Service: Vascular;  Laterality: Left;   [place] Left 02/10/2021   Procedure: LEFT ARM SECOND STAGE BASILIC VEIN TRANSPOSITION;  Surgeon: Rosetta Posner, MD;  Location: [facility];  Service: Vascular;  Laterality: Left;   COLONOSCOPY      CORONARY STENT INTERVENTION N/A 05/26/2021   Procedure: CORONARY STENT INTERVENTION;  Surgeon: Troy Sine, MD;  Location: [place] [facility];  Service: Cardiovascular;  Laterality: N/A;   INSERTION OF DIALYSIS CATHETER Right 12/23/2020   Procedure: INSERTION OF PALINDROME DIALYSIS CATHETER;  Surgeon: Rosetta Posner, MD;  Location: [facility];  Service: Vascular;  Laterality: Right;   LEFT HEART CATH AND CORONARY ANGIOGRAPHY N/A 05/26/2021   Procedure: LEFT HEART CATH AND CORONARY ANGIOGRAPHY;  Surgeon: Troy Sine, MD;  Location: [place] [facility];  Service: Cardiovascular;  Laterality: N/A;   REMOVAL OF A DIALYSIS CATHETER N/A 05/05/2021   Procedure: MINOR REMOVAL OF A TUNNELED DIALYSIS CATHETER;  Surgeon: Rosetta Posner, MD;  Location: [facility];  Service: Vascular;  Laterality: N/A;    Family History  Problem Relation Age of Onset   Breast cancer Mother        died 11   Emphysema Father        died 30   Diabetes Sister    Diabetes Brother     Social History   Socioeconomic History   Marital status: Married    Spouse name: Not on file   Number of children: Not on file   Years of education: Not on file   Highest  education level: Not on file  Occupational History   Not on file  Tobacco Use   Smoking status: Never   Smokeless tobacco: Never  Vaping Use   Vaping Use: Never used  Substance and Sexual Activity   Alcohol use: Not Currently   Drug use: Never   Sexual activity: Not Currently  Other Topics Concern   Not on file  Social History Narrative   Not on file   Social Determinants of Health   Financial Resource Strain: Low Risk    Difficulty of Paying Living Expenses: Not hard at all  Food Insecurity: No Food Insecurity   Worried About Charity fundraiser in the Last Year: Never true   [place] in the Last Year: Never true  Transportation Needs: No Transportation Needs   Lack of Transportation (Medical): No   Lack of Transportation (Non-Medical): No   Physical Activity: Inactive   Days of Exercise per Week: 0 days   Minutes of Exercise per Session: 0 min  Stress: No Stress Concern Present   Feeling of Stress : Not at all  Social Connections: Moderately Isolated   Frequency of Communication with Friends and Family: More than three times a week   Frequency of Social Gatherings with Friends and Family: More than three times a week   Attends Religious Services: Never   Marine scientist or Organizations: No   Attends Music therapist: Never   Marital Status: Married  Human resources officer Violence: Not At Risk   Fear of Current or Ex-Partner: No   Emotionally Abused: No   Physically Abused: No   Sexually Abused: No    ROS Review of Systems  Constitutional:  Negative for chills and fever.  HENT:  Negative for congestion and sore throat.   Eyes:  Negative for pain and discharge.  Respiratory:  Negative for cough and shortness of breath.   Cardiovascular:  Negative for chest pain and palpitations.  Gastrointestinal:  Negative for constipation, diarrhea, nausea and vomiting.  Endocrine: Negative for polydipsia and polyuria.  Genitourinary:  Negative for dysuria and hematuria.  Musculoskeletal:  Negative for neck pain and neck stiffness.  Skin:  Negative for rash.  Neurological:  Negative for dizziness, weakness, numbness and headaches.  Psychiatric/Behavioral:  Negative for agitation and behavioral problems.    Objective:   Today's Vitals: BP 122/72 (BP Location: Right Arm, Patient Position: Sitting, Cuff Size: Normal)    Pulse 82    Resp 18    Ht 6' (1.829 m)    Wt 236 lb 9.6 oz (107.3 kg)    SpO2 99%    BMI 32.09 kg/m   Physical Exam Vitals reviewed.  Constitutional:      General: He is not in acute distress.    Appearance: He is not diaphoretic.  HENT:     Head: Normocephalic and atraumatic.     Nose: Nose normal.     Mouth/Throat:     Mouth: Mucous membranes are moist.  Eyes:     General: No scleral  icterus.    Extraocular Movements: Extraocular movements intact.  Cardiovascular:     Rate and Rhythm: Normal rate and regular rhythm.     Pulses: Normal pulses.     Heart sounds: Normal heart sounds. No murmur heard. Pulmonary:     Breath sounds: Normal breath sounds. No wheezing or rales.  Musculoskeletal:     Cervical back: Neck supple. No tenderness.     Right lower leg: No  edema.     Left lower leg: No edema.  Skin:    General: Skin is warm.     Findings: No rash.     Comments: AV fistula on left UE  Neurological:     General: No focal deficit present.     Mental Status: He is alert and oriented to person, place, and time.     Sensory: No sensory deficit.     Motor: No weakness.  Psychiatric:        Mood and Affect: Mood normal.        Behavior: Behavior normal.    Assessment & Plan:   Problem List Items Addressed This Visit       Cardiovascular and Mediastinum   Essential hypertension    BP Readings from Last 1 Encounters:  06/23/21 122/72  Well-controlled with Coreg Counseled for compliance with the medications Advised DASH diet and moderate exercise/walking, at least 150 mins/week      Coronary artery disease of native heart with stable angina pectoris ([place])    S/p stent placement in 2022 On DAPT with aspirin and Brilinta, on statin Denies any anginal chest pain or dyspnea currently Followed by cardiology        Respiratory   Asthma, chronic, mild persistent, uncomplicated    Well controlled with albuterol as needed        Digestive   Gastroesophageal reflux disease    Well controlled with pantoprazole        Endocrine   Diabetes mellitus type 2 in obese [facility]) - Primary    Lab Results  Component Value Date   HGBA1C 6.7 (H) 05/26/2021  Was on insulin in the past, currently diet controlled since starting HD in 12/2020 Advised to follow diabetic diet On statin F/u CMP and lipid panel Diabetic foot exam: Today Diabetic eye exam: Advised to  follow up with Ophthalmology for diabetic eye exam        Genitourinary   ESRD (end stage renal disease) ([place])    On HD - MWF On sevelamer       Outpatient Encounter Medications as of 06/23/2021  Medication Sig   albuterol (VENTOLIN HFA) 108 (90 Base) MCG/ACT inhaler INHALE 2 PUFFS BY MOUTH EVERY 4 HOURS AS NEEDED ONLY  IF  YOU  CANT  CATCH  YOUR  BREATH   aspirin 81 MG chewable tablet Chew 1 tablet (81 mg total) by mouth daily.   atorvastatin (LIPITOR) 80 MG tablet Take 1 tablet (80 mg total) by mouth daily.   carvedilol (COREG) 3.125 MG tablet Take 1 tablet (3.125 mg total) by mouth 2 (two) times daily with a meal.   nitroGLYCERIN (NITROSTAT) 0.4 MG SL tablet Place 1 tablet (0.4 mg total) under the tongue every 5 (five) minutes as needed for chest pain.   pantoprazole (PROTONIX) 40 MG tablet Take 1 tablet (40 mg total) by mouth daily.   sevelamer (RENAGEL) 800 MG tablet Take 800 mg by mouth 3 (three) times daily with meals.   ticagrelor (BRILINTA) 90 MG TABS tablet Take 1 tablet (90 mg total) by mouth 2 (two) times daily.   [DISCONTINUED] allopurinol (ZYLOPRIM) 300 MG tablet Take 300 mg by mouth daily as needed (gout).   No facility-administered encounter medications on file as of 06/23/2021.    Follow-up: Return in about 6 months (around 12/24/2021) for ESRD and CAD.   Lindell Spar, MD

## 2021-06-26 NOTE — Assessment & Plan Note (Signed)
BP Readings from Last 1 Encounters:  ?06/23/21 122/72  ? ?Well-controlled with Coreg ?Counseled for compliance with the medications ?Advised DASH diet and moderate exercise/walking, at least 150 mins/week ?

## 2021-06-26 NOTE — Assessment & Plan Note (Signed)
Lab Results  ?Component Value Date  ? HGBA1C 6.7 (H) 05/26/2021  ? ?Was on insulin in the past, currently diet controlled since starting HD in 12/2020 ?Advised to follow diabetic diet ?On statin ?F/u CMP and lipid panel ?Diabetic foot exam: Today ?Diabetic eye exam: Advised to follow up with Ophthalmology for diabetic eye exam ?

## 2021-06-30 ENCOUNTER — Other Ambulatory Visit: Payer: Self-pay | Admitting: *Deleted

## 2021-06-30 MED ORDER — TICAGRELOR 90 MG PO TABS: 90.0000 mg | ORAL_TABLET | Freq: Two times a day (BID) | ORAL | 0 refills | Status: DC

## 2021-06-30 MED ORDER — PANTOPRAZOLE SODIUM 40 MG PO TBEC: 40.0000 mg | DELAYED_RELEASE_TABLET | Freq: Every day | ORAL | 0 refills | Status: DC

## 2021-06-30 MED ORDER — CARVEDILOL 3.125 MG PO TABS: 3.1250 mg | ORAL_TABLET | Freq: Two times a day (BID) | ORAL | 0 refills | Status: DC

## 2021-06-30 MED ORDER — ATORVASTATIN CALCIUM 80 MG PO TABS: 80.0000 mg | ORAL_TABLET | Freq: Every day | ORAL | 0 refills | Status: DC

## 2021-07-01 ENCOUNTER — Ambulatory Visit: Payer: PPO | Admitting: Student

## 2021-07-06 ENCOUNTER — Other Ambulatory Visit: Payer: Self-pay

## 2021-07-06 ENCOUNTER — Ambulatory Visit (INDEPENDENT_AMBULATORY_CARE_PROVIDER_SITE_OTHER): Payer: PPO

## 2021-07-06 DIAGNOSIS — Z0000 Encounter for general adult medical examination without abnormal findings: Secondary | ICD-10-CM

## 2021-07-06 NOTE — Patient Instructions (Signed)
?  [name] [name] , ?Thank you for taking time to come for your Medicare Wellness Visit. I appreciate your ongoing commitment to your health goals. Please review the following plan we discussed and let me know if I can assist you in the future.  ? ?These are the goals we discussed: ? Goals   ? ?  Patient Stated   ?  Patient states that he would like to lose more weight ?  ? ?  ?  ?This is a list of the screening recommended for you and due dates:  ?Health Maintenance  ?Topic Date Due  ? Pneumonia Vaccine (1 - PCV) Never done  ? Eye exam for diabetics  Never done  ? Urine Protein Check  Never done  ? Tetanus Vaccine  Never done  ? Zoster (Shingles) Vaccine (1 of 2) Never done  ? COVID-19 Vaccine (3 - Moderna risk series) 07/20/2019  ? Hemoglobin A1C  11/23/2021  ? Complete foot exam   06/24/2022  ? Colon Cancer Screening  11/08/2029  ? Flu Shot  Completed  ? Hepatitis C Screening: USPSTF Recommendation to screen - Ages 31-79 yo.  Completed  ? HPV Vaccine  Aged Out  ? ?

## 2021-07-06 NOTE — Progress Notes (Signed)
? ?Subjective:  ? [name] [name] is a 71 y.o. male who presents for an Initial Medicare Annual Wellness Visit. ?I connected with  [name] [name] on 07/06/21 by a audio enabled telemedicine application and verified that I am speaking with the correct person using two identifiers. ? ?Patient Location: Home ? ?Provider Location: Office/Clinic ? ?I discussed the limitations of evaluation and management by telemedicine. The patient expressed understanding and agreed to proceed.  ?Review of Systems    ? ?  ? ?   ?Objective:  ?  ?There were no vitals filed for this visit. ?There is no height or weight on file to calculate BMI. ? ?Advanced Directives 05/25/2021 05/21/2021 02/10/2021 01/13/2021 12/23/2020 12/19/2020 12/17/2020  ?Does Patient Have a Medical Advance Directive? No No No No No No No  ?Would patient like information on creating a medical advance directive? No - Patient declined No - Patient declined - No - Patient declined No - Patient declined No - Patient declined -  ? ? ?Current Medications (verified) ?Outpatient Encounter Medications as of 07/06/2021  ?Medication Sig  ? albuterol (VENTOLIN HFA) 108 (90 Base) MCG/ACT inhaler INHALE 2 PUFFS BY MOUTH EVERY 4 HOURS AS NEEDED ONLY  IF  YOU  CANT  CATCH  YOUR  BREATH  ? aspirin 81 MG chewable tablet Chew 1 tablet (81 mg total) by mouth daily.  ? atorvastatin (LIPITOR) 80 MG tablet Take 1 tablet (80 mg total) by mouth daily.  ? carvedilol (COREG) 3.125 MG tablet Take 1 tablet (3.125 mg total) by mouth 2 (two) times daily with a meal.  ? nitroGLYCERIN (NITROSTAT) 0.4 MG SL tablet Place 1 tablet (0.4 mg total) under the tongue every 5 (five) minutes as needed for chest pain.  ? pantoprazole (PROTONIX) 40 MG tablet Take 1 tablet (40 mg total) by mouth daily.  ? sevelamer (RENAGEL) 800 MG tablet Take 800 mg by mouth 3 (three) times daily with meals.  ? ticagrelor (BRILINTA) 90 MG TABS tablet Take 1 tablet (90 mg total) by mouth 2 (two) times daily.  ? ?No facility-administered  encounter medications on file as of 07/06/2021.  ? ? ?Allergies (verified) ?Patient has no known allergies.  ? ?History: ?Past Medical History:  ?Diagnosis Date  ? Asthma   ? CHF (congestive heart failure) ([place])   ? Depression   ? Diabetes mellitus without complication ([place])   ? Dyspnea   ? Stage 4 chronic kidney disease ([place])   ? ?Past Surgical History:  ?Procedure Laterality Date  ? ABCESS DRAINAGE    ? on back - possible spider bite  ? AV FISTULA PLACEMENT Left 12/23/2020  ? Procedure: LEFT ARM ARTERIOVENOUS (AV) FISTULA CREATION;  Surgeon: Rosetta Posner, MD;  Location: [facility];  Service: Vascular;  Laterality: Left;  ? BASCILIC VEIN TRANSPOSITION Left 02/10/2021  ? Procedure: LEFT ARM SECOND STAGE BASILIC VEIN TRANSPOSITION;  Surgeon: Rosetta Posner, MD;  Location: [facility];  Service: Vascular;  Laterality: Left;  ? COLONOSCOPY    ? CORONARY STENT INTERVENTION N/A 05/26/2021  ? Procedure: CORONARY STENT INTERVENTION;  Surgeon: Troy Sine, MD;  Location: [place] [facility];  Service: Cardiovascular;  Laterality: N/A;  ? INSERTION OF DIALYSIS CATHETER Right 12/23/2020  ? Procedure: INSERTION OF PALINDROME DIALYSIS CATHETER;  Surgeon: Rosetta Posner, MD;  Location: [facility];  Service: Vascular;  Laterality: Right;  ? LEFT HEART CATH AND CORONARY ANGIOGRAPHY N/A 05/26/2021  ? Procedure: LEFT HEART CATH AND CORONARY ANGIOGRAPHY;  Surgeon: Troy Sine, MD;  Location: [place] [facility];  Service: Cardiovascular;  Laterality: N/A;  ? REMOVAL OF A DIALYSIS CATHETER N/A 05/05/2021  ? Procedure: MINOR REMOVAL OF A TUNNELED DIALYSIS CATHETER;  Surgeon: Rosetta Posner, MD;  Location: [facility];  Service: Vascular;  Laterality: N/A;  ? ?Family History  ?Problem Relation Age of Onset  ? Breast cancer Mother   ?     died 63  ? Emphysema Father   ?     died 20  ? Diabetes Sister   ? Diabetes Brother   ? ?Social History  ? ?Socioeconomic History  ? Marital status: Married  ?  Spouse name: Not on file  ? Number of children: Not on file   ? Years of education: Not on file  ? Highest education level: Not on file  ?Occupational History  ? Not on file  ?Tobacco Use  ? Smoking status: Never  ? Smokeless tobacco: Never  ?Vaping Use  ? Vaping Use: Never used  ?Substance and Sexual Activity  ? Alcohol use: Not Currently  ? Drug use: Never  ? Sexual activity: Not Currently  ?Other Topics Concern  ? Not on file  ?Social History Narrative  ? Not on file  ? ?Social Determinants of Health  ? ?Financial Resource Strain: Low Risk   ? Difficulty of Paying Living Expenses: Not hard at all  ?Food Insecurity: No Food Insecurity  ? Worried About Charity fundraiser in the Last Year: Never true  ? Ran Out of Food in the Last Year: Never true  ?Transportation Needs: No Transportation Needs  ? Lack of Transportation (Medical): No  ? Lack of Transportation (Non-Medical): No  ?Physical Activity: Inactive  ? Days of Exercise per Week: 0 days  ? Minutes of Exercise per Session: 0 min  ?Stress: No Stress Concern Present  ? Feeling of Stress : Not at all  ?Social Connections: Moderately Isolated  ? Frequency of Communication with Friends and Family: More than three times a week  ? Frequency of Social Gatherings with Friends and Family: More than three times a week  ? Attends Religious Services: Never  ? Active Member of Clubs or Organizations: No  ? Attends Archivist Meetings: Never  ? Marital Status: Married  ? ? ?Tobacco Counseling ?Counseling given: Not Answered ? ? ?Clinical Intake: ? ?  ? ?  ? ?  ? ?  ? ?  ? ?Diabetic?yes ? ?  ? ?  ? ? ?Activities of Daily Living ?In your present state of health, do you have any difficulty performing the following activities: 05/26/2021 12/30/2020  ?Hearing? Y N  ?Vision? N N  ?Difficulty concentrating or making decisions? N N  ?Walking or climbing stairs? Y N  ?Dressing or bathing? N N  ?Doing errands, shopping? Y -  ?Some recent data might be hidden  ? ? ?Patient Care Team: ?Lindell Spar, MD as PCP - General (Internal  Medicine) ? ?Indicate any recent Medical Services you may have received from other than [facility] providers in the past year (date may be approximate). ? ?   ?Assessment:  ? This is a routine wellness examination for Edson. ? ?Hearing/Vision screen ?No results found. ? ?Dietary issues and exercise activities discussed: ?  ? ? Goals Addressed   ?None ?  ? ?Depression Screen ?PHQ 2/9 Scores 06/23/2021 12/17/2020  ?PHQ - 2 Score 0 0  ?  ?Fall Risk ?Fall Risk  06/23/2021 12/17/2020 11/16/2018  ?Falls in the past year? 0  0 (No Data)  ?Comment - - [facility]i Telephone Survey: data to providers prior to load  ?Number falls in past yr: 0 - (No Data)  ?Comment - - Emmi Telephone Survey Actual Response =   ?Injury with Fall? 0 - -  ?Risk for fall due to : No Fall Risks - -  ?Follow up Falls evaluation completed - -  ? ? ?FALL RISK PREVENTION PERTAINING TO THE HOME: ? ?Any stairs in or around the home? Yes  ?If so, are there any without handrails? No  ?Home free of loose throw rugs in walkways, pet beds, electrical cords, etc? Yes  ?Adequate lighting in your home to reduce risk of falls? Yes  ? ?ASSISTIVE DEVICES UTILIZED TO PREVENT FALLS: ? ?Life alert? No  ?Use of a cane, walker or w/c? No  ?Grab bars in the bathroom? Yes  ?Shower chair or bench in shower? No  ?Elevated toilet seat or a handicapped toilet? Yes  ? ?TIMED UP AND GO: ? ?Was the test performed? No .  ?Length of time to ambulate 10 feet:  sec.  ? ? ? ?Cognitive Function: ?  ?  ?  ? ?Immunizations ?Immunization History  ?Administered Date(s) Administered  ? Moderna Sars-Covid-2 Vaccination 05/25/2019, 06/22/2019  ? ? ?TDAP status: Due, Education has been provided regarding the importance of this vaccine. Advised may receive this vaccine at local pharmacy or Health Dept. Aware to provide a copy of the vaccination record if obtained from local pharmacy or Health Dept. Verbalized acceptance and understanding. ? ?Flu Vaccine status: Up to date ? ?Pneumococcal vaccine status: Due,  Education has been provided regarding the importance of this vaccine. Advised may receive this vaccine at local pharmacy or Health Dept. Aware to provide a copy of the vaccination record if obtained from local phar

## 2021-07-14 ENCOUNTER — Ambulatory Visit: Payer: PPO

## 2021-07-14 ENCOUNTER — Other Ambulatory Visit: Payer: Self-pay

## 2021-07-14 LAB — HM DIABETES EYE EXAM

## 2021-07-17 DIAGNOSIS — N186 End stage renal disease: Secondary | ICD-10-CM | POA: Diagnosis not present

## 2021-07-17 DIAGNOSIS — Z992 Dependence on renal dialysis: Secondary | ICD-10-CM | POA: Diagnosis not present

## 2021-07-20 DIAGNOSIS — N186 End stage renal disease: Secondary | ICD-10-CM | POA: Diagnosis not present

## 2021-07-20 DIAGNOSIS — D509 Iron deficiency anemia, unspecified: Secondary | ICD-10-CM | POA: Diagnosis not present

## 2021-07-20 DIAGNOSIS — Z992 Dependence on renal dialysis: Secondary | ICD-10-CM | POA: Diagnosis not present

## 2021-07-20 DIAGNOSIS — D631 Anemia in chronic kidney disease: Secondary | ICD-10-CM | POA: Diagnosis not present

## 2021-07-27 ENCOUNTER — Ambulatory Visit: Payer: PPO | Admitting: Internal Medicine

## 2021-07-31 ENCOUNTER — Other Ambulatory Visit: Payer: Self-pay | Admitting: Internal Medicine

## 2021-08-04 ENCOUNTER — Other Ambulatory Visit: Payer: Self-pay | Admitting: Internal Medicine

## 2021-08-09 NOTE — Progress Notes (Signed)
?Cardiology Office Note:   ? ?Date:  08/10/2021  ? ?ID:  [name] [name], DOB [birth year], MRN [id number] ? ?PCP:  Lindell Spar, MD ?  ?[facility] Providers ?Cardiologist:  None    ? ?Referring MD: Neale Burly, MD  ? ?CC:  Follow up HF ? ?History of Present Illness:   ? ?[name] [name] is a 71 y.o. male with a hx of HTN, HFrEF, ESRD, following up CP from 2/23 APPT. 05/26/21 Found to have CP.  We transferred him to [facility] and he has PCI of mLAD.  Still have diag disease planned for medical management. ? ?Patient notes that he is doing great.   ?[place]uns in [facility] and cuts the grass.  Walks and has no symptoms. ?There are no interval hospital/ED visit.   ? ?No chest pain or pressure .  No SOB/DOE and no PND/Orthopnea.  No weight gain or leg swelling.  No palpitations or syncope . ? ?In HD 124/68 On current regimen (just came from HD today). ?Had need no PRN Nitro. ? ? ?Past Medical History:  ?Diagnosis Date  ? Asthma   ? CHF (congestive heart failure) ([place])   ? Depression   ? Diabetes mellitus without complication ([place])   ? Dyspnea   ? Stage 4 chronic kidney disease ([place])   ? ? ?Past Surgical History:  ?Procedure Laterality Date  ? ABCESS DRAINAGE    ? on back - possible spider bite  ? AV FISTULA PLACEMENT Left 12/23/2020  ? Procedure: LEFT ARM ARTERIOVENOUS (AV) FISTULA CREATION;  Surgeon: Rosetta Posner, MD;  [facility]: [facility];  Service: Vascular;  Laterality: Left;  ? BASCILIC VEIN TRANSPOSITION Left 02/10/2021  ? Procedure: LEFT ARM SECOND STAGE BASILIC VEIN TRANSPOSITION;  Surgeon: Rosetta Posner, MD;  Location: [facility];  Service: Vascular;  Laterality: Left;  ? COLONOSCOPY    ? CORONARY STENT INTERVENTION N/A 05/26/2021  ? Procedure: CORONARY STENT INTERVENTION;  Surgeon: Troy Sine, MD;  Location: [place] [facility];  Service: Cardiovascular;  Laterality: N/A;  ? INSERTION OF DIALYSIS CATHETER Right 12/23/2020  ? Procedure: INSERTION OF PALINDROME DIALYSIS CATHETER;  Surgeon: Rosetta Posner, MD;  Location: [facility];   Service: Vascular;  Laterality: Right;  ? LEFT HEART CATH AND CORONARY ANGIOGRAPHY N/A 05/26/2021  ? Procedure: LEFT HEART CATH AND CORONARY ANGIOGRAPHY;  Surgeon: Troy Sine, MD;  Location: [place] [facility];  Service: Cardiovascular;  Laterality: N/A;  ? REMOVAL OF A DIALYSIS CATHETER N/A 05/05/2021  ? Procedure: MINOR REMOVAL OF A TUNNELED DIALYSIS CATHETER;  Surgeon: Rosetta Posner, MD;  Location: [facility];  Service: Vascular;  Laterality: N/A;  ? ? ?Current Medications: ?Current Meds  ?Medication Sig  ? albuterol (VENTOLIN HFA) 108 (90 Base) MCG/ACT inhaler INHALE 2 PUFFS BY MOUTH EVERY 4 HOURS AS NEEDED ONLY  IF  YOU  CANT  CATCH  YOUR  BREATH  ? aspirin 81 MG chewable tablet Chew 1 tablet (81 mg total) by mouth daily.  ? atorvastatin (LIPITOR) 80 MG tablet Take 1 tablet by mouth once daily  ? BRILINTA 90 MG TABS tablet Take 1 tablet by mouth twice daily  ? carvedilol (COREG) 3.125 MG tablet TAKE 1 TABLET BY MOUTH TWICE DAILY WITH A MEAL  ? nitroGLYCERIN (NITROSTAT) 0.4 MG SL tablet Place 1 tablet (0.4 mg total) under the tongue every 5 (five) minutes as needed for chest pain.  ? pantoprazole (PROTONIX) 40 MG tablet Take 1 tablet by mouth once daily  ?  sevelamer (RENAGEL) 800 MG tablet Take 800 mg by mouth 3 (three) times daily with meals.  ?  ? ?Allergies:   Patient has no known allergies.  ? ?Social History  ? ?Socioeconomic History  ? Marital status: Married  ?  Spouse name: Not on file  ? Number of children: Not on file  ? Years of education: Not on file  ? Highest education level: Not on file  ?Occupational History  ? Not on file  ?Tobacco Use  ? Smoking status: Never  ? Smokeless tobacco: Never  ?Vaping Use  ? Vaping Use: Never used  ?Substance and Sexual Activity  ? Alcohol use: Not Currently  ? Drug use: Never  ? Sexual activity: Not Currently  ?Other Topics Concern  ? Not on file  ?Social History Narrative  ? Not on file  ? ?Social Determinants of Health  ? ?Financial Resource Strain: Low Risk   ?  Difficulty of Paying Living Expenses: Not hard at all  ?Food Insecurity: No Food Insecurity  ? Worried About Charity fundraiser in the Last Year: Never true  ? Ran Out of Food in the Last Year: Never true  ?Transportation Needs: No Transportation Needs  ? Lack of Transportation (Medical): No  ? Lack of Transportation (Non-Medical): No  ?Physical Activity: Inactive  ? Days of Exercise per Week: 0 days  ? Minutes of Exercise per Session: 0 min  ?Stress: No Stress Concern Present  ? Feeling of Stress : Not at all  ?Social Connections: Moderately Isolated  ? Frequency of Communication with Friends and Family: More than three times a week  ? Frequency of Social Gatherings with Friends and Family: More than three times a week  ? Attends Religious Services: Never  ? Active Member of Clubs or Organizations: No  ? Attends Archivist Meetings: Never  ? Marital Status: Married  ?  ? ?Family History: ?The patient's family history includes Breast cancer in his mother; Diabetes in his brother and sister; Emphysema in his father. ? ?ROS:   ?Please see the history of present illness.    ? All other systems reviewed and are negative. ? ?EKGs/Labs/Other Studies Reviewed:   ? ?The following studies were reviewed today: ? ?Transthoracic Echocardiogram: ?Date: 05/27/21 ?Results: ? 1. Mild hypokinesis of the mid anteroseptal/inferoseptal wall. Left  ?ventricular ejection fraction, by estimation, is 50%. The left  ?ventricle has low normal function. The left ventricle has no regional wall  ?motion abnormalities. Left ventricular  ?diastolic parameters are consistent with Grade I diastolic dysfunction  ?(impaired relaxation).  ? 2. Right ventricular systolic function is normal. The right ventricular  ?size is normal.  ? 3. No evidence of mitral valve regurgitation.  ? 4. Aortic valve regurgitation is mild.  ? 5. The inferior vena cava is normal in size with greater than 50%  ?respiratory variability, suggesting right atrial  pressure of 3 mmHg.  ? ?Left/Right Heart Catheterizations: ?Date: 05/26/21 ?Results: ?Prox RCA lesion is 30% stenosed. ?  Prox LAD to Mid LAD lesion is 30% stenosed. ?  1st Diag lesion is 80% stenosed. ?  Mid LAD lesion is 95% stenosed. ?  2nd Diag lesion is 85% stenosed. ?  2nd Sept lesion is 80% stenosed. ?  A drug-eluting stent was successfully placed. ?  Post intervention, there is a 0% residual stenosis. ?  ?Predominant single-vessel coronary artery disease with 30% diffuse irregularity of the proximal LAD with ostial diagonal stenoses in small caliber vessels and 95% focal  proximal to mid stenosis with mild luminal irregularity of the mid LAD; normal left circumflex coronary artery, and mild irregularity in the RCA with 25% smooth mid stenosis. ?  ?LVEDP 10 mmHg ?  ?Successful PCI to the 95% LAD stenosis with ultimate insertion of a 3.0 x 18 mm Medtronic Onyx frontiers stent postdilated to 3.10 with the stenosis being reduced to oh percent.  There is ostial narrowing of the septal and small caliber diagonal vessel within the stented  segment. ?  ?RECOMMENDATION: ?DAPT for minimum of 1 year.  Medical therapy for mild concomitant CAD.  Aggressive lipid-lowering therapy with target LDL less than 55.  The patient undergoes dialysis on Monday Wednesday and Fridays. ? ? ?Recent Labs: ?01/13/2021: B Natriuretic Peptide 262.0 ?05/14/2021: ALT 20 ?05/27/2021: BUN 64; Creatinine, Ser 11.26; Hemoglobin 10.0; Platelets 198; Potassium 4.0; Sodium 134  ?Recent Lipid Panel ?   ?Component Value Date/Time  ? CHOL 124 05/27/2021 0447  ? TRIG 77 05/27/2021 0447  ? HDL 36 (L) 05/27/2021 0447  ? CHOLHDL 3.4 05/27/2021 0447  ? VLDL 15 05/27/2021 0447  ? [place] 73 05/27/2021 0447  ? ?    ? ?Physical Exam:   ? ?VS:  BP (!) 144/60   Pulse 76   Ht 6' (1.829 m)   Wt 238 lb (108 kg)   SpO2 95%   BMI 32.28 kg/m?    ? ?Wt Readings from Last 3 Encounters:  ?08/10/21 238 lb (108 kg)  ?06/23/21 236 lb 9.6 oz (107.3 kg)  ?05/27/21 239 lb 13.8  oz (108.8 kg)  ?  ?Gen: No distress  ?Neck: No JVD ?Cardiac: No Rubs or Gallops, no Murmur, RRR +2 radial pulses; L arm fistula ?Respiratory: Brief inspiratory wheeze; then resolved, normal rate and effort ?GI: So

## 2021-08-10 ENCOUNTER — Encounter: Payer: Self-pay | Admitting: Internal Medicine

## 2021-08-10 ENCOUNTER — Ambulatory Visit (INDEPENDENT_AMBULATORY_CARE_PROVIDER_SITE_OTHER): Payer: PPO | Admitting: Internal Medicine

## 2021-08-10 VITALS — BP 144/60 | HR 76 | Ht 72.0 in | Wt 238.0 lb

## 2021-08-10 DIAGNOSIS — I25118 Atherosclerotic heart disease of native coronary artery with other forms of angina pectoris: Secondary | ICD-10-CM

## 2021-08-10 DIAGNOSIS — N186 End stage renal disease: Secondary | ICD-10-CM

## 2021-08-10 DIAGNOSIS — I5032 Chronic diastolic (congestive) heart failure: Secondary | ICD-10-CM | POA: Diagnosis not present

## 2021-08-10 NOTE — Patient Instructions (Signed)
Labwork: ?1 Week: ?Fasting Lipids ?ALT ? ?Follow-Up: ?Follow up in the fall with Dr. Gasper Sells ? ?Any Other Special Instructions Will Be Listed Below (If Applicable). ? ? ? ? ?If you need a refill on your cardiac medications before your next appointment, please call your pharmacy. ? ?

## 2021-08-14 ENCOUNTER — Other Ambulatory Visit (HOSPITAL_COMMUNITY)
Admission: RE | Admit: 2021-08-14 | Discharge: 2021-08-14 | Disposition: A | Discharge status: Home / Self Care | Payer: PPO | Source: Ambulatory Visit | Attending: Internal Medicine | Admitting: Internal Medicine

## 2021-08-14 DIAGNOSIS — I25118 Atherosclerotic heart disease of native coronary artery with other forms of angina pectoris: Secondary | ICD-10-CM | POA: Insufficient documentation

## 2021-08-14 LAB — LIPID PANEL
Cholesterol: 122 mg/dL (ref 0–200)
HDL: 49 mg/dL (ref >40)
LDL Cholesterol: 56 mg/dL (ref 0–99)
Total CHOL/HDL Ratio: 2.5 RATIO
Triglycerides: 83 mg/dL (ref <150)
VLDL: 17 mg/dL (ref 0–40)

## 2021-08-14 LAB — ALT: ALT: 17 U/L (ref 0–44)

## 2021-08-16 DIAGNOSIS — Z992 Dependence on renal dialysis: Secondary | ICD-10-CM | POA: Diagnosis not present

## 2021-08-16 DIAGNOSIS — N186 End stage renal disease: Secondary | ICD-10-CM | POA: Diagnosis not present

## 2021-08-17 DIAGNOSIS — D509 Iron deficiency anemia, unspecified: Secondary | ICD-10-CM | POA: Diagnosis not present

## 2021-08-17 DIAGNOSIS — D631 Anemia in chronic kidney disease: Secondary | ICD-10-CM | POA: Diagnosis not present

## 2021-08-17 DIAGNOSIS — N186 End stage renal disease: Secondary | ICD-10-CM | POA: Diagnosis not present

## 2021-08-17 DIAGNOSIS — Z992 Dependence on renal dialysis: Secondary | ICD-10-CM | POA: Diagnosis not present

## 2021-09-07 ENCOUNTER — Other Ambulatory Visit: Payer: Self-pay | Admitting: Internal Medicine

## 2021-09-16 DIAGNOSIS — N186 End stage renal disease: Secondary | ICD-10-CM | POA: Diagnosis not present

## 2021-09-16 DIAGNOSIS — Z992 Dependence on renal dialysis: Secondary | ICD-10-CM | POA: Diagnosis not present

## 2021-09-17 ENCOUNTER — Other Ambulatory Visit (HOSPITAL_COMMUNITY): Payer: PPO

## 2021-09-18 DIAGNOSIS — D631 Anemia in chronic kidney disease: Secondary | ICD-10-CM | POA: Diagnosis not present

## 2021-09-18 DIAGNOSIS — N186 End stage renal disease: Secondary | ICD-10-CM | POA: Diagnosis not present

## 2021-09-18 DIAGNOSIS — Z992 Dependence on renal dialysis: Secondary | ICD-10-CM | POA: Diagnosis not present

## 2021-09-18 DIAGNOSIS — D509 Iron deficiency anemia, unspecified: Secondary | ICD-10-CM | POA: Diagnosis not present

## 2021-09-18 DIAGNOSIS — Z23 Encounter for immunization: Secondary | ICD-10-CM | POA: Diagnosis not present

## 2021-09-24 ENCOUNTER — Ambulatory Visit (HOSPITAL_COMMUNITY): Payer: PPO | Admitting: Hematology

## 2021-10-12 LAB — HEMOGLOBIN A1C: Hemoglobin A1C: 5.9

## 2021-10-15 ENCOUNTER — Other Ambulatory Visit: Payer: Self-pay | Admitting: Internal Medicine

## 2021-10-16 DIAGNOSIS — Z992 Dependence on renal dialysis: Secondary | ICD-10-CM | POA: Diagnosis not present

## 2021-10-16 DIAGNOSIS — N186 End stage renal disease: Secondary | ICD-10-CM | POA: Diagnosis not present

## 2021-10-19 DIAGNOSIS — N186 End stage renal disease: Secondary | ICD-10-CM | POA: Diagnosis not present

## 2021-10-19 DIAGNOSIS — D631 Anemia in chronic kidney disease: Secondary | ICD-10-CM | POA: Diagnosis not present

## 2021-10-19 DIAGNOSIS — D509 Iron deficiency anemia, unspecified: Secondary | ICD-10-CM | POA: Diagnosis not present

## 2021-10-19 DIAGNOSIS — Z23 Encounter for immunization: Secondary | ICD-10-CM | POA: Diagnosis not present

## 2021-10-19 DIAGNOSIS — Z992 Dependence on renal dialysis: Secondary | ICD-10-CM | POA: Diagnosis not present

## 2021-11-05 ENCOUNTER — Other Ambulatory Visit: Payer: Self-pay

## 2021-11-05 ENCOUNTER — Encounter (HOSPITAL_COMMUNITY): Payer: Self-pay

## 2021-11-05 ENCOUNTER — Emergency Department (HOSPITAL_COMMUNITY)
Admission: EM | Admit: 2021-11-05 | Discharge: 2021-11-06 | Disposition: A | Discharge status: Home / Self Care | Payer: PPO | Attending: Emergency Medicine | Admitting: Emergency Medicine

## 2021-11-05 DIAGNOSIS — Z951 Presence of aortocoronary bypass graft: Secondary | ICD-10-CM | POA: Diagnosis not present

## 2021-11-05 DIAGNOSIS — R1031 Right lower quadrant pain: Secondary | ICD-10-CM | POA: Insufficient documentation

## 2021-11-05 DIAGNOSIS — R112 Nausea with vomiting, unspecified: Secondary | ICD-10-CM | POA: Diagnosis not present

## 2021-11-05 DIAGNOSIS — R109 Unspecified abdominal pain: Secondary | ICD-10-CM

## 2021-11-05 DIAGNOSIS — J45909 Unspecified asthma, uncomplicated: Secondary | ICD-10-CM | POA: Insufficient documentation

## 2021-11-05 DIAGNOSIS — K5730 Diverticulosis of large intestine without perforation or abscess without bleeding: Secondary | ICD-10-CM | POA: Diagnosis not present

## 2021-11-05 DIAGNOSIS — N400 Enlarged prostate without lower urinary tract symptoms: Secondary | ICD-10-CM | POA: Diagnosis not present

## 2021-11-05 DIAGNOSIS — E1122 Type 2 diabetes mellitus with diabetic chronic kidney disease: Secondary | ICD-10-CM | POA: Insufficient documentation

## 2021-11-05 DIAGNOSIS — I509 Heart failure, unspecified: Secondary | ICD-10-CM | POA: Insufficient documentation

## 2021-11-05 DIAGNOSIS — R1011 Right upper quadrant pain: Secondary | ICD-10-CM | POA: Diagnosis not present

## 2021-11-05 DIAGNOSIS — N184 Chronic kidney disease, stage 4 (severe): Secondary | ICD-10-CM | POA: Diagnosis not present

## 2021-11-05 LAB — COMPREHENSIVE METABOLIC PANEL WITH GFR
ALT: 16 U/L (ref 0–44)
AST: 18 U/L (ref 15–41)
Albumin: 4.1 g/dL (ref 3.5–5.0)
Alkaline Phosphatase: 94 U/L (ref 38–126)
Anion gap: 15 (ref 5–15)
BUN: 63 mg/dL — ABNORMAL HIGH (ref 8–23)
CO2: 26 mmol/L (ref 22–32)
Calcium: 9.3 mg/dL (ref 8.9–10.3)
Chloride: 97 mmol/L — ABNORMAL LOW (ref 98–111)
Creatinine, Ser: 9.15 mg/dL — ABNORMAL HIGH (ref 0.61–1.24)
GFR, Estimated: 6 mL/min — ABNORMAL LOW
Glucose, Bld: 112 mg/dL — ABNORMAL HIGH (ref 70–99)
Potassium: 4.6 mmol/L (ref 3.5–5.1)
Sodium: 138 mmol/L (ref 135–145)
Total Bilirubin: 0.7 mg/dL (ref 0.3–1.2)
Total Protein: 7.8 g/dL (ref 6.5–8.1)

## 2021-11-05 LAB — CBC
HCT: 38.9 % — ABNORMAL LOW (ref 39.0–52.0)
Hemoglobin: 12.2 g/dL — ABNORMAL LOW (ref 13.0–17.0)
MCH: 28.3 pg (ref 26.0–34.0)
MCHC: 31.4 g/dL (ref 30.0–36.0)
MCV: 90.3 fL (ref 80.0–100.0)
Platelets: 201 10*3/uL (ref 150–400)
RBC: 4.31 MIL/uL (ref 4.22–5.81)
RDW: 14.4 % (ref 11.5–15.5)
WBC: 6.5 10*3/uL (ref 4.0–10.5)
nRBC: 0 % (ref 0.0–0.2)

## 2021-11-05 LAB — LIPASE, BLOOD: Lipase: 72 U/L — ABNORMAL HIGH (ref 11–51)

## 2021-11-05 NOTE — ED Triage Notes (Signed)
Sharp pain on right side, started on Tuesday. N/v. Dialysis MWF.

## 2021-11-06 ENCOUNTER — Emergency Department (HOSPITAL_COMMUNITY): Payer: PPO

## 2021-11-06 ENCOUNTER — Encounter (HOSPITAL_COMMUNITY): Payer: Self-pay | Admitting: Radiology

## 2021-11-06 DIAGNOSIS — N400 Enlarged prostate without lower urinary tract symptoms: Secondary | ICD-10-CM | POA: Diagnosis not present

## 2021-11-06 DIAGNOSIS — K5730 Diverticulosis of large intestine without perforation or abscess without bleeding: Secondary | ICD-10-CM | POA: Diagnosis not present

## 2021-11-06 MED ORDER — SODIUM CHLORIDE 0.9 % IV BOLUS
500.0000 mL | Freq: Once | INTRAVENOUS | Status: AC
  Administered 2021-11-06: 500 mL via INTRAVENOUS

## 2021-11-06 MED ORDER — IOHEXOL 300 MG/ML  SOLN
100.0000 mL | Freq: Once | INTRAMUSCULAR | Status: AC | PRN
  Administered 2021-11-06: 100 mL via INTRAVENOUS

## 2021-11-06 NOTE — ED Provider Notes (Signed)
[facility] Emergency Department Provider Note MRN:  [id number]  Arrival date & time: 11/06/21     Chief Complaint   Abdominal Pain   History of Present Illness   [name] [name] is a 71 y.o. year-old male with a history of CKD, diabetes, CHF presenting to the ED with chief complaint of abdominal pain.  Right upper quadrant abdominal pain over the past 2 days.  Persistent, associated with nausea and vomiting this evening.  Had some right lower quadrant abdominal pain as well yesterday but not currently.  Denies fever, no constipation or diarrhea, no testicular pain.  Review of Systems  A thorough review of systems was obtained and all systems are negative except as noted in the HPI and PMH.   Patient's Health History    Past Medical History:  Diagnosis Date   Asthma    CHF (congestive heart failure) ([place])    Depression    Diabetes mellitus without complication ([place])    Dyspnea    Stage 4 chronic kidney disease ([place])     Past Surgical History:  Procedure Laterality Date   ABCESS DRAINAGE     on back - possible spider bite   AV FISTULA PLACEMENT Left 12/23/2020   Procedure: LEFT ARM ARTERIOVENOUS (AV) FISTULA CREATION;  Surgeon: Rosetta Posner, MD;  Location: [facility];  Service: Vascular;  Laterality: Left;   [place] Left 02/10/2021   Procedure: LEFT ARM SECOND STAGE BASILIC VEIN TRANSPOSITION;  Surgeon: Rosetta Posner, MD;  Location: [facility];  Service: Vascular;  Laterality: Left;   COLONOSCOPY     CORONARY STENT INTERVENTION N/A 05/26/2021   Procedure: CORONARY STENT INTERVENTION;  Surgeon: Troy Sine, MD;  Location: [place] [facility];  Service: Cardiovascular;  Laterality: N/A;   INSERTION OF DIALYSIS CATHETER Right 12/23/2020   Procedure: INSERTION OF PALINDROME DIALYSIS CATHETER;  Surgeon: Rosetta Posner, MD;  Location[facility];  Service: Vascular;  Laterality: Right;   LEFT HEART CATH AND CORONARY ANGIOGRAPHY N/A 05/26/2021    Procedure: LEFT HEART CATH AND CORONARY ANGIOGRAPHY;  Surgeon: Troy Sine, MD;  Location: [place] [facility];  Service: Cardiovascular;  Laterality: N/A;   REMOVAL OF A DIALYSIS CATHETER N/A 05/05/2021   Procedure: MINOR REMOVAL OF A TUNNELED DIALYSIS CATHETER;  Surgeon: Rosetta Posner, MD;  Location: [facility];  Service: Vascular;  Laterality: N/A;    Family History  Problem Relation Age of Onset   Breast cancer Mother        died 28   Emphysema Father        died 79   Diabetes Sister    Diabetes Brother     Social History   Socioeconomic History   Marital status: Married    Spouse name: Not on file   Number of children: Not on file   Years of education: Not on file   Highest education level: Not on file  Occupational History   Not on file  Tobacco Use   Smoking status: Never   Smokeless tobacco: Never  Vaping Use   Vaping Use: Never used  Substance and Sexual Activity   Alcohol use: Not Currently   Drug use: Never   Sexual activity: Not Currently  Other Topics Concern   Not on file  Social History Narrative   Not on file   Social Determinants of Health   Financial Resource Strain: Low Risk  (12/19/2020)   Overall Financial Resource Strain (CARDIA)    Difficulty  of Paying Living Expenses: Not hard at all  Food Insecurity: No Food Insecurity (12/19/2020)   Hunger Vital Sign    Worried About Running Out of Food in the Last Year: Never true    Ran Out of Food in the Last Year: Never true  Transportation Needs: No Transportation Needs (12/19/2020)   PRAPARE - Hydrologist (Medical): No    Lack of Transportation (Non-Medical): No  Physical Activity: Inactive (12/19/2020)   Exercise Vital Sign    Days of Exercise per Week: 0 days    Minutes of Exercise per Session: 0 min  Stress: No Stress Concern Present (12/19/2020)   [place]    Feeling of Stress : Not at all  Social  Connections: Moderately Isolated (12/19/2020)   Social Connection and Isolation Panel [NHANES]    Frequency of Communication with Friends and Family: More than three times a week    Frequency of Social Gatherings with Friends and Family: More than three times a week    Attends Religious Services: Never    Marine scientist or Organizations: No    Attends Archivist Meetings: Never    Marital Status: Married  Human resources officer Violence: Not At Risk (12/19/2020)   Humiliation, Afraid, Rape, and Kick questionnaire    Fear of Current or Ex-Partner: No    Emotionally Abused: No    Physically Abused: No    Sexually Abused: No     Physical Exam   Vitals:   11/06/21 0030 11/06/21 0045  BP: (!) 182/86   Pulse: 65 71  Resp: 18   Temp:    SpO2: 100% 100%    CONSTITUTIONAL: Well-appearing, NAD NEURO/PSYCH:  Alert and oriented x 3, no focal deficits EYES:  eyes equal and reactive ENT/NECK:  no LAD, no JVD CARDIO: Regular rate, well-perfused, normal S1 and S2 PULM:  CTAB no wheezing or rhonchi GI/GU:  non-distended, non-tender MSK/SPINE:  No gross deformities, no edema SKIN:  no rash, atraumatic   *Additional and/or pertinent findings included in MDM below  Diagnostic and Interventional Summary    EKG Interpretation  Date/Time:    Ventricular Rate:    PR Interval:    QRS Duration:   QT Interval:    QTC Calculation:   R Axis:     Text Interpretation:         Labs Reviewed  LIPASE, BLOOD - Abnormal; Notable for the following components:      Result Value   Lipase 72 (*)    All other components within normal limits  COMPREHENSIVE METABOLIC PANEL - Abnormal; Notable for the following components:   Chloride 97 (*)    Glucose, Bld 112 (*)    BUN 63 (*)    Creatinine, Ser 9.15 (*)    GFR, Estimated 6 (*)    All other components within normal limits  CBC - Abnormal; Notable for the following components:   Hemoglobin 12.2 (*)    HCT 38.9 (*)    All other  components within normal limits  URINALYSIS, ROUTINE W REFLEX MICROSCOPIC    CT ABDOMEN PELVIS W CONTRAST  Final Result      Medications  sodium chloride 0.9 % bolus 500 mL (500 mLs Intravenous New Bag/Given 11/06/21 0139)  iohexol (OMNIPAQUE) 300 MG/ML solution 100 mL (100 mLs Intravenous Contrast Given 11/06/21 0157)     Procedures  /  Critical Care Procedures  ED Course and Medical  Decision Making  Initial Impression and Ddx Differential diagnosis includes cholecystitis, pancreatitis, diverticulitis, appendicitis, enteritis, awaiting CT.  Past medical/surgical history that increases complexity of ED encounter: ESRD  Interpretation of Diagnostics I personally reviewed the laboratory assessment and my interpretation is as follows: No significant changes of blood counts and electrolytes, kidney function at or near recent baseline.  Minimally elevated lipase of unclear significance  CT imaging is reassuring, no signs of acute pancreatitis or other emergent process.  Constipation noted  Patient Reassessment and Ultimate Disposition/Management     Patient continues to look and feel well with normal vital signs, appropriate for discharge.  Patient management required discussion with the following services or consulting groups:  None  Complexity of Problems Addressed Acute illness or injury that poses threat of life of bodily function  Additional Data Reviewed and Analyzed Further history obtained from: Prior labs/imaging results  Additional Factors Impacting ED Encounter Risk None  Barth Kirks. [place] Small, [place] [email or web address]  Final Clinical Impressions(s) / ED Diagnoses     ICD-10-CM   1. Abdominal pain, unspecified abdominal location  R10.9       ED Discharge Orders     None        Discharge Instructions Discussed with and Provided to Patient:     Discharge Instructions      You were evaluated in the  Emergency Department and after careful evaluation, we did not find any emergent condition requiring admission or further testing in the hospital.  Your exam/testing today was overall reassuring.  Symptoms may be due to constipation.  Recommend over-the-counter MiraLAX to make sure you are having a daily stool  Please return to the Emergency Department if you experience any worsening of your condition.  Thank you for allowing [place] to be a part of your care.        Maudie Flakes, MD 11/06/21 [phone]

## 2021-11-06 NOTE — Discharge Instructions (Signed)
You were evaluated in the Emergency Department and after careful evaluation, we did not find any emergent condition requiring admission or further testing in the hospital.  Your exam/testing today was overall reassuring.  Symptoms may be due to constipation.  Recommend over-the-counter MiraLAX to make sure you are having a daily stool  Please return to the Emergency Department if you experience any worsening of your condition.  Thank you for allowing [place] to be a part of your care.

## 2021-11-16 DIAGNOSIS — N186 End stage renal disease: Secondary | ICD-10-CM | POA: Diagnosis not present

## 2021-11-16 DIAGNOSIS — Z992 Dependence on renal dialysis: Secondary | ICD-10-CM | POA: Diagnosis not present

## 2021-11-20 DIAGNOSIS — D509 Iron deficiency anemia, unspecified: Secondary | ICD-10-CM | POA: Diagnosis not present

## 2021-11-20 DIAGNOSIS — Z23 Encounter for immunization: Secondary | ICD-10-CM | POA: Diagnosis not present

## 2021-11-20 DIAGNOSIS — Z992 Dependence on renal dialysis: Secondary | ICD-10-CM | POA: Diagnosis not present

## 2021-11-20 DIAGNOSIS — N186 End stage renal disease: Secondary | ICD-10-CM | POA: Diagnosis not present

## 2021-11-20 DIAGNOSIS — D631 Anemia in chronic kidney disease: Secondary | ICD-10-CM | POA: Diagnosis not present

## 2021-12-17 DIAGNOSIS — N186 End stage renal disease: Secondary | ICD-10-CM | POA: Diagnosis not present

## 2021-12-17 DIAGNOSIS — Z992 Dependence on renal dialysis: Secondary | ICD-10-CM | POA: Diagnosis not present

## 2021-12-18 DIAGNOSIS — N186 End stage renal disease: Secondary | ICD-10-CM | POA: Diagnosis not present

## 2021-12-18 DIAGNOSIS — Z992 Dependence on renal dialysis: Secondary | ICD-10-CM | POA: Diagnosis not present

## 2021-12-18 DIAGNOSIS — D631 Anemia in chronic kidney disease: Secondary | ICD-10-CM | POA: Diagnosis not present

## 2021-12-18 DIAGNOSIS — D509 Iron deficiency anemia, unspecified: Secondary | ICD-10-CM | POA: Diagnosis not present

## 2021-12-18 DIAGNOSIS — Z23 Encounter for immunization: Secondary | ICD-10-CM | POA: Diagnosis not present

## 2021-12-24 ENCOUNTER — Encounter: Payer: Self-pay | Admitting: Internal Medicine

## 2021-12-24 ENCOUNTER — Encounter: Payer: Self-pay | Admitting: *Deleted

## 2021-12-24 ENCOUNTER — Ambulatory Visit (INDEPENDENT_AMBULATORY_CARE_PROVIDER_SITE_OTHER): Payer: PPO | Admitting: Internal Medicine

## 2021-12-24 VITALS — BP 132/74 | HR 86 | Resp 18 | Ht 71.5 in | Wt 234.2 lb

## 2021-12-24 DIAGNOSIS — E669 Obesity, unspecified: Secondary | ICD-10-CM | POA: Diagnosis not present

## 2021-12-24 DIAGNOSIS — I10 Essential (primary) hypertension: Secondary | ICD-10-CM | POA: Diagnosis not present

## 2021-12-24 DIAGNOSIS — I5032 Chronic diastolic (congestive) heart failure: Secondary | ICD-10-CM | POA: Diagnosis not present

## 2021-12-24 DIAGNOSIS — J4530 Mild persistent asthma, uncomplicated: Secondary | ICD-10-CM

## 2021-12-24 DIAGNOSIS — E1169 Type 2 diabetes mellitus with other specified complication: Secondary | ICD-10-CM | POA: Diagnosis not present

## 2021-12-24 DIAGNOSIS — N186 End stage renal disease: Secondary | ICD-10-CM

## 2021-12-24 DIAGNOSIS — I25118 Atherosclerotic heart disease of native coronary artery with other forms of angina pectoris: Secondary | ICD-10-CM | POA: Diagnosis not present

## 2021-12-24 DIAGNOSIS — H40033 Anatomical narrow angle, bilateral: Secondary | ICD-10-CM | POA: Diagnosis not present

## 2021-12-24 DIAGNOSIS — H2513 Age-related nuclear cataract, bilateral: Secondary | ICD-10-CM | POA: Diagnosis not present

## 2021-12-24 LAB — HM DIABETES EYE EXAM

## 2021-12-24 MED ORDER — BENZONATATE 100 MG PO CAPS: 100.0000 mg | ORAL_CAPSULE | Freq: Two times a day (BID) | ORAL | 2 refills | Status: DC | PRN

## 2021-12-24 NOTE — Progress Notes (Signed)
Established Patient Office Visit  Subjective:  Patient ID: [name] [name], male    DOB: 11/16/1950  Age: 71 y.o. MRN: [id number]  CC:  Chief Complaint  Patient presents with   Follow-up    6 month follow up ESRD and CAD patient has had dry hacking cough also patient was having right side pain and sometimes he still has this pain    HPI [name] [name] is a 71 y.o. male with past medical history of CAD s/p stent placement, ESRD on HD, type 2 DM, HTN and asthma who presents for f/u of his chronic medical conditions.  CAD and HTN: He had stent placement in 12/22, and is currently on DAPT and statin.  He denies any chest pain, dyspnea or palpitations currently.  His BP is well controlled with Coreg.  He also takes statin for HLD.  Type II DM: He was on insulin regimen in the past, but is currently diet controlled since he has lost about 100 pounds and is on HD for ESRD now.  He denies any fatigue, polyuria or polydipsia currently.  He has AV fistula for HD.  He denies any urinary complaint currently.   He takes pantoprazole for GERD.  Denies any dysphagia or odynophagia currently.  He c/o dry cough for the last 2 months, but denies any fever, chills, dyspnea, wheezing, nasal congestion or sore throat. He c/o mild right sided back pain while coughing. Denies any hemoptysis.     Past Medical History:  Diagnosis Date   Asthma    CHF (congestive heart failure) ([place])    Depression    Diabetes mellitus without complication ([place])    Dyspnea    Stage 4 chronic kidney disease ([place])     Past Surgical History:  Procedure Laterality Date   ABCESS DRAINAGE     on back - possible spider bite   AV FISTULA PLACEMENT Left 12/23/2020   Procedure: LEFT ARM ARTERIOVENOUS (AV) FISTULA CREATION;  Surgeon: Rosetta Posner, MD;  Location: [facility];  Service: Vascular;  Laterality: Left;   [place] Left 02/10/2021   Procedure: LEFT ARM SECOND STAGE BASILIC VEIN TRANSPOSITION;  Surgeon:  Rosetta Posner, MD;  Location: [facility];  Service: Vascular;  Laterality: Left;   COLONOSCOPY     CORONARY STENT INTERVENTION N/A 05/26/2021   Procedure: CORONARY STENT INTERVENTION;  Surgeon: Troy Sine, MD;  Location: [place] [facility];  Service: Cardiovascular;  Laterality: N/A;   INSERTION OF DIALYSIS CATHETER Right 12/23/2020   Procedure: INSERTION OF PALINDROME DIALYSIS CATHETER;  Surgeon: Rosetta Posner, MD;  Location: [facility];  Service: Vascular;  Laterality: Right;   LEFT HEART CATH AND CORONARY ANGIOGRAPHY N/A 05/26/2021   Procedure: LEFT HEART CATH AND CORONARY ANGIOGRAPHY;  Surgeon: Troy Sine, MD;  Location: [place] [facility];  Service: Cardiovascular;  Laterality: N/A;   REMOVAL OF A DIALYSIS CATHETER N/A 05/05/2021   Procedure: MINOR REMOVAL OF A TUNNELED DIALYSIS CATHETER;  Surgeon: Rosetta Posner, MD;  Location: [facility];  Service: Vascular;  Laterality: N/A;    Family History  Problem Relation Age of Onset   Breast cancer Mother        died 24   Emphysema Father        died 33   Diabetes Sister    Diabetes Brother     Social History   Socioeconomic History   Marital status: Married    Spouse name: Not on file   Number of children:  Not on file   Years of education: Not on file   Highest education level: Not on file  Occupational History   Not on file  Tobacco Use   Smoking status: Never   Smokeless tobacco: Never  Vaping Use   Vaping Use: Never used  Substance and Sexual Activity   Alcohol use: Not Currently   Drug use: Never   Sexual activity: Not Currently  Other Topics Concern   Not on file  Social History Narrative   Not on file   Social Determinants of Health   Financial Resource Strain: Low Risk  (12/19/2020)   Overall Financial Resource Strain (CARDIA)    Difficulty of Paying Living Expenses: Not hard at all  Food Insecurity: No Food Insecurity (12/19/2020)   Hunger Vital Sign    Worried About Running Out of Food in the Last Year: Never true     [place] in the Last Year: Never true  Transportation Needs: No Transportation Needs (12/19/2020)   PRAPARE - Hydrologist (Medical): No    Lack of Transportation (Non-Medical): No  Physical Activity: Inactive (12/19/2020)   Exercise Vital Sign    Days of Exercise per Week: 0 days    Minutes of Exercise per Session: 0 min  Stress: No Stress Concern Present (12/19/2020)   [place]    Feeling of Stress : Not at all  Social Connections: Moderately Isolated (12/19/2020)   Social Connection and Isolation Panel [NHANES]    Frequency of Communication with Friends and Family: More than three times a week    Frequency of Social Gatherings with Friends and Family: More than three times a week    Attends Religious Services: Never    Marine scientist or Organizations: No    Attends Archivist Meetings: Never    Marital Status: Married  Human resources officer Violence: Not At Risk (12/19/2020)   Humiliation, Afraid, Rape, and Kick questionnaire    Fear of Current or Ex-Partner: No    Emotionally Abused: No    Physically Abused: No    Sexually Abused: No    Outpatient Medications Prior to Visit  Medication Sig Dispense Refill   albuterol (VENTOLIN HFA) 108 (90 Base) MCG/ACT inhaler INHALE 2 PUFFS BY MOUTH EVERY 4 HOURS AS NEEDED ONLY  IF  YOU  CANT  CATCH  YOUR  BREATH 8 g 0   aspirin 81 MG chewable tablet Chew 1 tablet (81 mg total) by mouth daily. 30 tablet 11   atorvastatin (LIPITOR) 80 MG tablet Take 1 tablet by mouth once daily 30 tablet 5   BRILINTA 90 MG TABS tablet Take 1 tablet by mouth twice daily 60 tablet 5   carvedilol (COREG) 3.125 MG tablet TAKE 1 TABLET BY MOUTH TWICE DAILY WITH A MEAL 60 tablet 5   nitroGLYCERIN (NITROSTAT) 0.4 MG SL tablet Place 1 tablet (0.4 mg total) under the tongue every 5 (five) minutes as needed for chest pain. 30 tablet 12   pantoprazole (PROTONIX)  40 MG tablet Take 1 tablet by mouth once daily 30 tablet 5   sevelamer (RENAGEL) 800 MG tablet Take 800 mg by mouth 3 (three) times daily with meals.     No facility-administered medications prior to visit.    No Known Allergies  ROS Review of Systems  Constitutional:  Negative for chills and fever.  HENT:  Negative for congestion and sore throat.   Eyes:  Negative for pain and discharge.  Respiratory:  Positive for cough. Negative for shortness of breath.   Cardiovascular:  Negative for chest pain and palpitations.  Gastrointestinal:  Negative for constipation, diarrhea, nausea and vomiting.  Endocrine: Negative for polydipsia and polyuria.  Genitourinary:  Negative for dysuria and hematuria.  Musculoskeletal:  Negative for neck pain and neck stiffness.  Skin:  Negative for rash.  Neurological:  Negative for dizziness, weakness, numbness and headaches.  Psychiatric/Behavioral:  Negative for agitation and behavioral problems.       Objective:    Physical Exam Vitals reviewed.  Constitutional:      General: He is not in acute distress.    Appearance: He is not diaphoretic.  HENT:     Head: Normocephalic and atraumatic.     Nose: Nose normal.     Mouth/Throat:     Mouth: Mucous membranes are moist.  Eyes:     General: No scleral icterus.    Extraocular Movements: Extraocular movements intact.  Cardiovascular:     Rate and Rhythm: Normal rate and regular rhythm.     Pulses: Normal pulses.     Heart sounds: Normal heart sounds. No murmur heard. Pulmonary:     Breath sounds: Normal breath sounds. No wheezing or rales.  Musculoskeletal:     Cervical back: Neck supple. No tenderness.     Right lower leg: No edema.     Left lower leg: No edema.  Skin:    General: Skin is warm.     Findings: No rash.     Comments: AV fistula on left UE  Neurological:     General: No focal deficit present.     Mental Status: He is alert and oriented to person, place, and time.      Sensory: No sensory deficit.     Motor: No weakness.  Psychiatric:        Mood and Affect: Mood normal.        Behavior: Behavior normal.     BP 132/74 (BP Location: Right Arm, Patient Position: Sitting, Cuff Size: Normal)   Pulse 86   Resp 18   Ht 5' 11.5" (1.816 m)   Wt 234 lb 3.2 oz (106.2 kg)   SpO2 96%   BMI 32.21 kg/m  Wt Readings from Last 3 Encounters:  12/24/21 234 lb 3.2 oz (106.2 kg)  11/05/21 220 lb (99.8 kg)  08/10/21 238 lb (108 kg)    Lab Results  Component Value Date   TSH 1.96 04/24/2018   Lab Results  Component Value Date   WBC 6.5 11/05/2021   HGB 12.2 (L) 11/05/2021   HCT 38.9 (L) 11/05/2021   MCV 90.3 11/05/2021   PLT 201 11/05/2021   Lab Results  Component Value Date   NA 138 11/05/2021   K 4.6 11/05/2021   CO2 26 11/05/2021   GLUCOSE 112 (H) 11/05/2021   BUN 63 (H) 11/05/2021   CREATININE 9.15 (H) 11/05/2021   BILITOT 0.7 11/05/2021   ALKPHOS 94 11/05/2021   AST 18 11/05/2021   ALT 16 11/05/2021   PROT 7.8 11/05/2021   ALBUMIN 4.1 11/05/2021   CALCIUM 9.3 11/05/2021   ANIONGAP 15 11/05/2021   GFR 33.04 (L) 04/24/2018   Lab Results  Component Value Date   CHOL 122 08/14/2021   Lab Results  Component Value Date   HDL 49 08/14/2021   Lab Results  Component Value Date   LDLCALC 56 08/14/2021   Lab Results  Component Value Date  TRIG 83 08/14/2021   Lab Results  Component Value Date   CHOLHDL 2.5 08/14/2021   Lab Results  Component Value Date   HGBA1C 5.9 10/12/2021      Assessment & Plan:   Problem List Items Addressed This Visit       Cardiovascular and Mediastinum   Essential hypertension    BP Readings from Last 1 Encounters:  12/24/21 132/74  Well-controlled with Coreg Counseled for compliance with the medications Advised DASH diet and moderate exercise/walking, at least 150 mins/week      Coronary artery disease of native heart with stable angina pectoris ([place])    S/p stent placement in 2022 On DAPT  with aspirin and Brilinta, on statin Denies any anginal chest pain or dyspnea currently Followed by cardiology      Chronic diastolic heart failure ([place])    Has h/o CAD On Coreg Appears euvolemic currently        Respiratory   Asthma, chronic, mild persistent, uncomplicated    Well controlled with albuterol as needed Tessalon PRN for cough      Relevant Medications   benzonatate (TESSALON) 100 MG capsule     Endocrine   Diabetes mellitus type 2 in obese [facility])    Lab Results  Component Value Date   HGBA1C 5.9 10/12/2021  Was on insulin in the past, currently diet controlled since starting HD in 12/2020 Advised to follow diabetic diet On statin F/u CMP and lipid panel Diabetic eye exam: Advised to follow up with Ophthalmology for diabetic eye exam      Relevant Orders   Microalbumin / creatinine urine ratio     Genitourinary   ESRD (end stage renal disease) ([place]) - Primary    On HD - MWF On sevelamer Blood tests from HD center reviewed       Meds ordered this encounter  Medications   benzonatate (TESSALON) 100 MG capsule    Sig: Take 1 capsule (100 mg total) by mouth 2 (two) times daily as needed for cough.    Dispense:  30 capsule    Refill:  2    Follow-up: Return in about 6 months (around 06/24/2022) for Annual physical.    Lindell Spar, MD

## 2021-12-24 NOTE — Patient Instructions (Addendum)
Please take Benzonatate as needed for cough. Use albuterol inhaler for shortness of breath or wheezing.  Please continue to take other medications as prescribed.  Please continue to follow low salt diet as suggested by dialysis center.  Please consider getting Shingrix and Tdap vaccines at your local pharmacy.

## 2021-12-25 NOTE — Assessment & Plan Note (Signed)
Lab Results  Component Value Date   HGBA1C 5.9 10/12/2021   Was on insulin in the past, currently diet controlled since starting HD in 12/2020 Advised to follow diabetic diet On statin F/u CMP and lipid panel Diabetic eye exam: Advised to follow up with Ophthalmology for diabetic eye exam 

## 2021-12-25 NOTE — Assessment & Plan Note (Signed)
S/p stent placement in 2022 On DAPT with aspirin and Brilinta, on statin Denies any anginal chest pain or dyspnea currently Followed by cardiology

## 2021-12-25 NOTE — Assessment & Plan Note (Addendum)
On HD - MWF On sevelamer Blood tests from HD center reviewed

## 2021-12-25 NOTE — Assessment & Plan Note (Addendum)
Has h/o CAD On Coreg Appears euvolemic currently

## 2021-12-25 NOTE — Assessment & Plan Note (Signed)
Well controlled with albuterol as needed Tessalon PRN for cough

## 2021-12-25 NOTE — Assessment & Plan Note (Signed)
BP Readings from Last 1 Encounters:  12/24/21 132/74   Well-controlled with Coreg Counseled for compliance with the medications Advised DASH diet and moderate exercise/walking, at least 150 mins/week

## 2021-12-29 DIAGNOSIS — E1169 Type 2 diabetes mellitus with other specified complication: Secondary | ICD-10-CM | POA: Diagnosis not present

## 2021-12-29 DIAGNOSIS — E669 Obesity, unspecified: Secondary | ICD-10-CM | POA: Diagnosis not present

## 2021-12-31 LAB — MICROALBUMIN / CREATININE URINE RATIO
Creatinine, Urine: 51 mg/dL
Microalb/Creat Ratio: 1087 mg/g creat — ABNORMAL HIGH (ref 0–29)
Microalbumin, Urine: 554.4 ug/mL

## 2022-01-16 DIAGNOSIS — N186 End stage renal disease: Secondary | ICD-10-CM | POA: Diagnosis not present

## 2022-01-16 DIAGNOSIS — Z992 Dependence on renal dialysis: Secondary | ICD-10-CM | POA: Diagnosis not present

## 2022-01-18 DIAGNOSIS — Z23 Encounter for immunization: Secondary | ICD-10-CM | POA: Diagnosis not present

## 2022-01-18 DIAGNOSIS — D509 Iron deficiency anemia, unspecified: Secondary | ICD-10-CM | POA: Diagnosis not present

## 2022-01-18 DIAGNOSIS — Z992 Dependence on renal dialysis: Secondary | ICD-10-CM | POA: Diagnosis not present

## 2022-01-18 DIAGNOSIS — D631 Anemia in chronic kidney disease: Secondary | ICD-10-CM | POA: Diagnosis not present

## 2022-01-18 DIAGNOSIS — N186 End stage renal disease: Secondary | ICD-10-CM | POA: Diagnosis not present

## 2022-02-16 DIAGNOSIS — Z992 Dependence on renal dialysis: Secondary | ICD-10-CM | POA: Diagnosis not present

## 2022-02-16 DIAGNOSIS — N186 End stage renal disease: Secondary | ICD-10-CM | POA: Diagnosis not present

## 2022-02-17 DIAGNOSIS — N186 End stage renal disease: Secondary | ICD-10-CM | POA: Diagnosis not present

## 2022-02-17 DIAGNOSIS — D631 Anemia in chronic kidney disease: Secondary | ICD-10-CM | POA: Diagnosis not present

## 2022-02-17 DIAGNOSIS — D509 Iron deficiency anemia, unspecified: Secondary | ICD-10-CM | POA: Diagnosis not present

## 2022-02-17 DIAGNOSIS — Z992 Dependence on renal dialysis: Secondary | ICD-10-CM | POA: Diagnosis not present

## 2022-02-24 ENCOUNTER — Other Ambulatory Visit: Payer: Self-pay | Admitting: Internal Medicine

## 2022-02-25 ENCOUNTER — Other Ambulatory Visit: Payer: Self-pay

## 2022-02-25 ENCOUNTER — Telehealth: Payer: Self-pay | Admitting: Internal Medicine

## 2022-02-25 MED ORDER — TICAGRELOR 90 MG PO TABS: 90.0000 mg | ORAL_TABLET | Freq: Two times a day (BID) | ORAL | 0 refills | Status: DC

## 2022-02-25 NOTE — Telephone Encounter (Signed)
Patient called [name] has not yet received this medicine.  BRILINTA 90 MG TABS tablet   Pharmacy: [facility]

## 2022-02-25 NOTE — Telephone Encounter (Signed)
Refills sent

## 2022-03-18 DIAGNOSIS — N186 End stage renal disease: Secondary | ICD-10-CM | POA: Diagnosis not present

## 2022-03-18 DIAGNOSIS — Z992 Dependence on renal dialysis: Secondary | ICD-10-CM | POA: Diagnosis not present

## 2022-03-19 DIAGNOSIS — D509 Iron deficiency anemia, unspecified: Secondary | ICD-10-CM | POA: Diagnosis not present

## 2022-03-19 DIAGNOSIS — Z23 Encounter for immunization: Secondary | ICD-10-CM | POA: Diagnosis not present

## 2022-03-19 DIAGNOSIS — N186 End stage renal disease: Secondary | ICD-10-CM | POA: Diagnosis not present

## 2022-03-19 DIAGNOSIS — D631 Anemia in chronic kidney disease: Secondary | ICD-10-CM | POA: Diagnosis not present

## 2022-03-19 DIAGNOSIS — Z992 Dependence on renal dialysis: Secondary | ICD-10-CM | POA: Diagnosis not present

## 2022-04-14 LAB — HEMOGLOBIN A1C: Hemoglobin A1C: 5.4

## 2022-04-18 DIAGNOSIS — Z992 Dependence on renal dialysis: Secondary | ICD-10-CM | POA: Diagnosis not present

## 2022-04-18 DIAGNOSIS — N186 End stage renal disease: Secondary | ICD-10-CM | POA: Diagnosis not present

## 2022-04-19 DIAGNOSIS — D509 Iron deficiency anemia, unspecified: Secondary | ICD-10-CM | POA: Diagnosis not present

## 2022-04-19 DIAGNOSIS — D631 Anemia in chronic kidney disease: Secondary | ICD-10-CM | POA: Diagnosis not present

## 2022-04-19 DIAGNOSIS — N186 End stage renal disease: Secondary | ICD-10-CM | POA: Diagnosis not present

## 2022-04-19 DIAGNOSIS — Z992 Dependence on renal dialysis: Secondary | ICD-10-CM | POA: Diagnosis not present

## 2022-04-20 ENCOUNTER — Other Ambulatory Visit: Payer: Self-pay | Admitting: Internal Medicine

## 2022-04-27 DIAGNOSIS — L400 Psoriasis vulgaris: Secondary | ICD-10-CM | POA: Diagnosis not present

## 2022-04-27 DIAGNOSIS — L853 Xerosis cutis: Secondary | ICD-10-CM | POA: Diagnosis not present

## 2022-05-19 DIAGNOSIS — N186 End stage renal disease: Secondary | ICD-10-CM | POA: Diagnosis not present

## 2022-05-19 DIAGNOSIS — Z992 Dependence on renal dialysis: Secondary | ICD-10-CM | POA: Diagnosis not present

## 2022-05-21 DIAGNOSIS — D509 Iron deficiency anemia, unspecified: Secondary | ICD-10-CM | POA: Diagnosis not present

## 2022-05-21 DIAGNOSIS — N186 End stage renal disease: Secondary | ICD-10-CM | POA: Diagnosis not present

## 2022-05-21 DIAGNOSIS — Z992 Dependence on renal dialysis: Secondary | ICD-10-CM | POA: Diagnosis not present

## 2022-05-21 DIAGNOSIS — D631 Anemia in chronic kidney disease: Secondary | ICD-10-CM | POA: Diagnosis not present

## 2022-05-26 ENCOUNTER — Ambulatory Visit: Payer: PPO | Attending: Physician Assistant | Admitting: Physician Assistant

## 2022-05-26 ENCOUNTER — Ambulatory Visit: Payer: PPO | Attending: Physician Assistant

## 2022-05-26 ENCOUNTER — Encounter: Payer: Self-pay | Admitting: Physician Assistant

## 2022-05-26 VITALS — BP 132/70 | HR 84 | Ht 72.0 in | Wt 236.0 lb

## 2022-05-26 DIAGNOSIS — Z992 Dependence on renal dialysis: Secondary | ICD-10-CM | POA: Diagnosis not present

## 2022-05-26 DIAGNOSIS — N186 End stage renal disease: Secondary | ICD-10-CM | POA: Diagnosis not present

## 2022-05-26 DIAGNOSIS — I5022 Chronic systolic (congestive) heart failure: Secondary | ICD-10-CM | POA: Diagnosis not present

## 2022-05-26 DIAGNOSIS — I2510 Atherosclerotic heart disease of native coronary artery without angina pectoris: Secondary | ICD-10-CM | POA: Diagnosis not present

## 2022-05-26 DIAGNOSIS — I493 Ventricular premature depolarization: Secondary | ICD-10-CM

## 2022-05-26 DIAGNOSIS — D649 Anemia, unspecified: Secondary | ICD-10-CM | POA: Diagnosis not present

## 2022-05-26 NOTE — Progress Notes (Signed)
Cardiology Office Note    Date:  05/26/2022   ID:  [name] [name], DOB March 21, 1951, MRN [id number]  PCP:  Lindell Spar, MD  Cardiologist:  Werner Lean, MD  Electrophysiologist:  None   Chief Complaint: f/u CAD  History of Present Illness:   [name] [name] is a 72 y.o. male with history of CAD s/p DES to LAD 05/2021, chronic HFrEF with improved LV function, ESRD on HD, DM2, asthma, obesity with prior significant weight loss, MGUS, chronic edema seen for follow-up.  Prior to establishing care with cardiology, he'd had an echo in 12/2020 demonstrating EF 35-40%, appears ordered by oncology, not clear on follow-up from this. He also reportedly had hospitalizations for CHF in the past as well. Our team met him in 05/2021 in setting of NSTEMI. Cath 05/2021 showed multivessel disease (30% prox RCA, 30% p-mLAD, 80% D1, 95% mLAD, 85% D2, 80% Sept2, DES placed to mLAD with recommendation for medical therapy for residual disease. Echo at that time showed EF 50-55%, mild hypokinesis of the mid anteroseptal/inferoseptal wall, G1DD, mild AI.   He returns for follow-up today, the 1 year anniversary of his PCI. He reports he is doing well without any CP, SOB, palpitations, syncope. His edema appears at baseline. He is tolerating HD well except periodically does see SBPs in the 90s at the very end of HD. They have not had to adjust his medicines just yet. His EKG demonstrates occasional PVCs today that he cannot feel.    Labwork independently reviewed: Scan 11/2021 K 4.8, albumin 4.1 10/2021 Hgb 12.2, plt 201 07/2021 LDL 56, ALT 17  Past History   Past Medical History:  Diagnosis Date   Asthma    CAD (coronary artery disease)    Chronic edema    Chronic HFrEF (heart failure with reduced ejection fraction) ([facility])    Depression    Diabetes mellitus ([place])    ESRD on hemodialysis ([facility])    MGUS (monoclonal gammopathy of unknown significance)    Obesity    prior weight loss    Past Surgical  History:  Procedure Laterality Date   ABCESS DRAINAGE     on back - possible spider bite   AV FISTULA PLACEMENT Left 12/23/2020   Procedure: LEFT ARM ARTERIOVENOUS (AV) FISTULA CREATION;  Surgeon: Rosetta Posner, MD;  Location: [facility];  Service: Vascular;  Laterality: Left;   [place] Left 02/10/2021   Procedure: LEFT ARM SECOND STAGE BASILIC VEIN TRANSPOSITION;  Surgeon: Rosetta Posner, MD;  Location: [facility];  Service: Vascular;  Laterality: Left;   COLONOSCOPY     CORONARY STENT INTERVENTION N/A 05/26/2021   Procedure: CORONARY STENT INTERVENTION;  Surgeon: Troy Sine, MD;  Location: [place] [facility];  Service: Cardiovascular;  Laterality: N/A;   INSERTION OF DIALYSIS CATHETER Right 12/23/2020   Procedure: INSERTION OF PALINDROME DIALYSIS CATHETER;  Surgeon: Rosetta Posner, MD;  Location: [facility];  Service: Vascular;  Laterality: Right;   LEFT HEART CATH AND CORONARY ANGIOGRAPHY N/A 05/26/2021   Procedure: LEFT HEART CATH AND CORONARY ANGIOGRAPHY;  Surgeon: Troy Sine, MD;  Location: [place] [facility];  Service: Cardiovascular;  Laterality: N/A;   REMOVAL OF A DIALYSIS CATHETER N/A 05/05/2021   Procedure: MINOR REMOVAL OF A TUNNELED DIALYSIS CATHETER;  Surgeon: Rosetta Posner, MD;  Location: [facility];  Service: Vascular;  Laterality: N/A;    Current Medications: Current Meds  Medication Sig   albuterol (VENTOLIN HFA) 108 (90 Base) MCG/ACT  inhaler INHALE 2 PUFFS BY MOUTH EVERY 4 HOURS AS NEEDED ONLY  IF  YOU  CANT  CATCH  YOUR  BREATH   aspirin 81 MG chewable tablet Chew 1 tablet (81 mg total) by mouth daily.   atorvastatin (LIPITOR) 80 MG tablet Take 1 tablet by mouth once daily   benzonatate (TESSALON) 100 MG capsule Take 1 capsule (100 mg total) by mouth 2 (two) times daily as needed for cough.   carvedilol (COREG) 3.125 MG tablet TAKE 1 TABLET BY MOUTH TWICE DAILY WITH A MEAL   nitroGLYCERIN (NITROSTAT) 0.4 MG SL tablet Place 1 tablet (0.4 mg total) under the tongue  every 5 (five) minutes as needed for chest pain.   pantoprazole (PROTONIX) 40 MG tablet Take 1 tablet by mouth once daily   sevelamer (RENAGEL) 800 MG tablet Take 800 mg by mouth 3 (three) times daily with meals.   ticagrelor (BRILINTA) 90 MG TABS tablet Take 1 tablet (90 mg total) by mouth 2 (two) times daily.      Allergies:   Patient has no known allergies.   Social History   Socioeconomic History   Marital status: Married    Spouse name: Not on file   Number of children: Not on file   Years of education: Not on file   Highest education level: Not on file  Occupational History   Not on file  Tobacco Use   Smoking status: Never   Smokeless tobacco: Never  Vaping Use   Vaping Use: Never used  Substance and Sexual Activity   Alcohol use: Not Currently   Drug use: Never   Sexual activity: Not Currently  Other Topics Concern   Not on file  Social History Narrative   Not on file   Social Determinants of Health   Financial Resource Strain: Low Risk  (12/19/2020)   Overall Financial Resource Strain (CARDIA)    Difficulty of Paying Living Expenses: Not hard at all  Food Insecurity: No Food Insecurity (12/19/2020)   Hunger Vital Sign    Worried About Running Out of Food in the Last Year: Never true    [place] in the Last Year: Never true  Transportation Needs: No Transportation Needs (12/19/2020)   PRAPARE - Hydrologist (Medical): No    Lack of Transportation (Non-Medical): No  Physical Activity: Inactive (12/19/2020)   Exercise Vital Sign    Days of Exercise per Week: 0 days    Minutes of Exercise per Session: 0 min  Stress: No Stress Concern Present (12/19/2020)   [place]    Feeling of Stress : Not at all  Social Connections: Moderately Isolated (12/19/2020)   Social Connection and Isolation Panel [NHANES]    Frequency of Communication with Friends and Family: More than  three times a week    Frequency of Social Gatherings with Friends and Family: More than three times a week    Attends Religious Services: Never    Marine scientist or Organizations: No    Attends Music therapist: Never    Marital Status: Married     Family History:  The patient's family history includes Breast cancer in his mother; Diabetes in his brother and sister; Emphysema in his father.  ROS:   Please see the history of present illness.  All other systems are reviewed and otherwise negative.    EKG(s)/Additional Testing   EKG:  EKG is ordered  today, personally reviewed, demonstrating NSR 84bpm with what appear to be either PVCs or fusion complex, nonspecific STTW changes  CV Studies: Cardiac studies reviewed are outlined and summarized above. Otherwise please see EMR for full report.  Recent Labs: 11/05/2021: ALT 16; BUN 63; Creatinine, Ser 9.15; Hemoglobin 12.2; Platelets 201; Potassium 4.6; Sodium 138  Recent Lipid Panel    Component Value Date/Time   CHOL 122 08/14/2021 1215   TRIG 83 08/14/2021 1215   HDL 49 08/14/2021 1215   CHOLHDL 2.5 08/14/2021 1215   VLDL 17 08/14/2021 1215   LDLCALC 56 08/14/2021 1215    PHYSICAL EXAM:    VS:  BP 132/70   Pulse 84   Ht 6' (1.829 m)   Wt 236 lb (107 kg)   BMI 32.01 kg/m   BMI: Body mass index is 32.01 kg/m.  GEN: Well nourished, well developed male in no acute distress HEENT: normocephalic, atraumatic Neck: no JVD, carotid bruits, or masses Cardiac: RRR with intermittent ectopy; no murmurs, rubs, or gallops, soft chronic appearing BLE edema  Respiratory:  clear to auscultation bilaterally, normal work of breathing GI: soft, nontender, nondistended, + BS MS: no deformity or atrophy Skin: warm and dry, no rash Neuro:  Alert and Oriented x 3, Strength and sensation are intact, follows commands Psych: euthymic mood, full affect  Wt Readings from Last 3 Encounters:  05/26/22 236 lb (107 kg)   12/24/21 234 lb 3.2 oz (106.2 kg)  11/05/21 220 lb (99.8 kg)     ASSESSMENT & PLAN:   1. CAD - doing very well without any recurrent anginal symptoms or dyspnea. I reached out Dr. Gasper Sells before his visit regarding plan for DAPT. Per our discussion, we will stop ASA + Brilinta and transition to clopidogrel '75mg'$  once daily monotherapy. Continue atorvastatin, carvedilol. Will also update lipids/CMET when he returns for echo (as below) since he ate lunch today.  2. PVCs - noted on EKG. When I search his chart he's had occasional PVCs so this is not entirely new. Today they are relatively frequent on exam. He is fortunately completely asymptomatic. Given his h/o LV dysfunction (which was interestingly improved around time of PCI compared to 2022), we will repeat echocardiogram to ensure stable along with 3 day Zio to quantify frequency. If PVC burden is high would need to consider switching carvedilol to metoprolol as it sounds like his BP would not support titration of carvedilol. If LV dysfunction has returned, I would plan to discuss repeat ischemic eval with MD though fortunately not having any recurrent symptoms to suggest this - would have low threshold to refer to EP if he has a combination of frequent PVCs and recurrent LV dysfunction. Will get routine labs when he returns for echo.  3. Chronic HFrEF with improved EF, mild AI - given frequent PVCs seen on EKG, will update echocardiogram to ensure LVEF stable from prior. This will also incidentally reassess his aortic insufficiency. Volume status managed by dialysis. BP acceptable today.  4. ESRD on HD - followed by nephrology.   5. Mild anemia (prior MGUS) - check CBC when he returns for labs.    Disposition: F/u with Dr. Dellia Cloud (transition from Dr. Gasper Sells who was temporarily covering [place]) or APP in 6 months (unless patient chooses to stay with Dr. Gasper Sells in [place] location), sooner if testing abnormal  above.   Medication Adjustments/Labs and Tests Ordered: Current medicines are reviewed at length with the patient today.  Concerns regarding medicines are outlined above. Medication  changes, Labs and Tests ordered today are summarized above and listed in the Patient Instructions accessible in Encounters.    Signed, Charlie Pitter, PA-C  05/26/2022 1:41 PM    [facility] Location in [place]. [place], [place] [address] Ph: [phone]; Fax [phone]

## 2022-05-26 NOTE — Patient Instructions (Addendum)
Medication Instructions:  Your physician has recommended you make the following change in your medication:  -Stop Aspirin  -Stop Brilinta -Start Plavix (Clopidogrel) 75 mg tablet once daily   Labwork: When you come for your Echo: -CMET -MAG -Lipid Panel- Nothing to eat/drink 6 hours prior to lab -CBC -TSH  Testing/Procedures: Your physician has requested that you have an echocardiogram. Echocardiography is a painless test that uses sound waves to create images of your heart. It provides your doctor with information about the size and shape of your heart and how well your heart's chambers and valves are working. This procedure takes approximately one hour. There are no restrictions for this procedure. Please do NOT wear cologne, perfume, aftershave, or lotions (deodorant is allowed). Please arrive 15 minutes prior to your appointment time.   Follow-Up: Follow up with Dr. Dellia Cloud in 6 months.   Any Other Special Instructions Will Be Listed Below (If Applicable).     If you need a refill on your cardiac medications before your next appointment, please call your pharmacy.

## 2022-05-27 ENCOUNTER — Telehealth: Payer: Self-pay | Admitting: Internal Medicine

## 2022-05-27 DIAGNOSIS — I493 Ventricular premature depolarization: Secondary | ICD-10-CM

## 2022-05-27 MED ORDER — CLOPIDOGREL BISULFATE 75 MG PO TABS: 75.0000 mg | ORAL_TABLET | Freq: Every day | ORAL | 11 refills | Status: DC

## 2022-05-27 NOTE — Telephone Encounter (Signed)
Patient states during his appointment with Melina Copa she discussed having a medication called in, but he contacted his pharmacy and they have not received a prescription. Patient does not remember the name of the medication.

## 2022-05-27 NOTE — Telephone Encounter (Signed)
Spoke to the patient, according to the encounter from 2/8 from [place], [place] : Start Plavix (Clopidogrel) 75 mg tablet once daily . Verified and sent the prescription to the pharmacy. Patient voiced understanding.

## 2022-05-28 ENCOUNTER — Other Ambulatory Visit: Payer: Self-pay

## 2022-05-28 NOTE — Telephone Encounter (Signed)
Spoke to patient who verbalized he understood to stop ASA/Brilinta and to start Plavix which was picked up at his local pharmacy. Patient had no further questions or concerns at this time.

## 2022-05-28 NOTE — Telephone Encounter (Signed)
Noted. It looks like the information made it to the patient's AVS to stop the ASA+Brilinta and start clopidogrel/Plavix as discussed in our visit, but the med list still needs updating. Pt called to get his Plavix rx which was sent in below, but ASA + Brilinta are still listed -  please call him to reinforce that Plavix is taking the place of ASA + Brilinta, not in addition to those two. We discussed this thoroughly at his visit but just to be sure since his med list still had ASA + Brilinta on it which need to be removed. Thank you so much!

## 2022-06-09 DIAGNOSIS — I493 Ventricular premature depolarization: Secondary | ICD-10-CM | POA: Diagnosis not present

## 2022-06-11 ENCOUNTER — Telehealth: Payer: Self-pay

## 2022-06-11 DIAGNOSIS — I4729 Other ventricular tachycardia: Secondary | ICD-10-CM

## 2022-06-11 DIAGNOSIS — I493 Ventricular premature depolarization: Secondary | ICD-10-CM

## 2022-06-11 MED ORDER — METOPROLOL SUCCINATE ER 25 MG PO TB24: 25.0000 mg | ORAL_TABLET | Freq: Two times a day (BID) | ORAL | 1 refills | Status: DC

## 2022-06-11 NOTE — Telephone Encounter (Signed)
-----   Message from Charlie Pitter, [place] sent at 06/11/2022  9:07 AM EST ----- Please let pt know monitor showed frequent skips in heartbeat as well as some in succession. Recommendations: - switch carvedilol to Toprol 59m BID - if he finds he is having low blood pressures at dialysis with this change, let [place] and we can adjust the dosing on HD days. Chose BID dosing so that we can make this change if needed. - remind to get labs when he can, was supposed to get with echo, included lipid panel - refer to EP for management of frequent PVCs/NSVT

## 2022-06-11 NOTE — Telephone Encounter (Signed)
I discussed monitor results with patient.  He will stop coreg and start toprol xl 25 mg bid. He will let us know if he has hypotension during HD.  I reminded him of lab work, he plans to do on day of echo scheduled on 06/22/22   He prefers [facility] so I place EP consult here.

## 2022-06-17 DIAGNOSIS — N186 End stage renal disease: Secondary | ICD-10-CM | POA: Diagnosis not present

## 2022-06-17 DIAGNOSIS — Z992 Dependence on renal dialysis: Secondary | ICD-10-CM | POA: Diagnosis not present

## 2022-06-18 DIAGNOSIS — N2581 Secondary hyperparathyroidism of renal origin: Secondary | ICD-10-CM | POA: Diagnosis not present

## 2022-06-18 DIAGNOSIS — D509 Iron deficiency anemia, unspecified: Secondary | ICD-10-CM | POA: Diagnosis not present

## 2022-06-18 DIAGNOSIS — D631 Anemia in chronic kidney disease: Secondary | ICD-10-CM | POA: Diagnosis not present

## 2022-06-18 DIAGNOSIS — N186 End stage renal disease: Secondary | ICD-10-CM | POA: Diagnosis not present

## 2022-06-18 DIAGNOSIS — Z23 Encounter for immunization: Secondary | ICD-10-CM | POA: Diagnosis not present

## 2022-06-18 DIAGNOSIS — Z992 Dependence on renal dialysis: Secondary | ICD-10-CM | POA: Diagnosis not present

## 2022-06-22 ENCOUNTER — Ambulatory Visit (HOSPITAL_COMMUNITY)
Admission: RE | Admit: 2022-06-22 | Discharge: 2022-06-22 | Disposition: A | Discharge status: Home / Self Care | Payer: PPO | Source: Ambulatory Visit | Attending: Physician Assistant | Admitting: Physician Assistant

## 2022-06-22 DIAGNOSIS — I493 Ventricular premature depolarization: Secondary | ICD-10-CM | POA: Diagnosis not present

## 2022-06-22 DIAGNOSIS — I5022 Chronic systolic (congestive) heart failure: Secondary | ICD-10-CM | POA: Diagnosis not present

## 2022-06-22 LAB — ECHOCARDIOGRAM COMPLETE
Area-P 1/2: 2.91 cm2
Est EF: 55
S' Lateral: 3.6 cm

## 2022-06-22 NOTE — Progress Notes (Signed)
*  PRELIMINARY RESULTS* Echocardiogram 2D Echocardiogram has been performed.  [name] [name] 06/22/2022, 12:21 PM

## 2022-06-24 ENCOUNTER — Ambulatory Visit (INDEPENDENT_AMBULATORY_CARE_PROVIDER_SITE_OTHER): Payer: PPO | Admitting: Internal Medicine

## 2022-06-24 ENCOUNTER — Encounter: Payer: Self-pay | Admitting: Internal Medicine

## 2022-06-24 VITALS — BP 120/70 | HR 75 | Ht 72.0 in | Wt 241.2 lb

## 2022-06-24 DIAGNOSIS — I10 Essential (primary) hypertension: Secondary | ICD-10-CM

## 2022-06-24 DIAGNOSIS — N186 End stage renal disease: Secondary | ICD-10-CM

## 2022-06-24 DIAGNOSIS — E1169 Type 2 diabetes mellitus with other specified complication: Secondary | ICD-10-CM | POA: Diagnosis not present

## 2022-06-24 DIAGNOSIS — E669 Obesity, unspecified: Secondary | ICD-10-CM | POA: Diagnosis not present

## 2022-06-24 DIAGNOSIS — J4530 Mild persistent asthma, uncomplicated: Secondary | ICD-10-CM

## 2022-06-24 DIAGNOSIS — K219 Gastro-esophageal reflux disease without esophagitis: Secondary | ICD-10-CM

## 2022-06-24 DIAGNOSIS — Z0001 Encounter for general adult medical examination with abnormal findings: Secondary | ICD-10-CM | POA: Insufficient documentation

## 2022-06-24 DIAGNOSIS — I25118 Atherosclerotic heart disease of native coronary artery with other forms of angina pectoris: Secondary | ICD-10-CM | POA: Diagnosis not present

## 2022-06-24 DIAGNOSIS — Z992 Dependence on renal dialysis: Secondary | ICD-10-CM

## 2022-06-24 DIAGNOSIS — I5032 Chronic diastolic (congestive) heart failure: Secondary | ICD-10-CM | POA: Diagnosis not present

## 2022-06-24 DIAGNOSIS — Z125 Encounter for screening for malignant neoplasm of prostate: Secondary | ICD-10-CM | POA: Diagnosis not present

## 2022-06-24 MED ORDER — PANTOPRAZOLE SODIUM 40 MG PO TBEC
40.0000 mg | DELAYED_RELEASE_TABLET | Freq: Every day | ORAL | 3 refills | Status: DC
Start: 2022-06-24 — End: 2023-06-27

## 2022-06-24 MED ORDER — ALBUTEROL SULFATE HFA 108 (90 BASE) MCG/ACT IN AERS: 1.0000 | INHALATION_SPRAY | Freq: Four times a day (QID) | RESPIRATORY_TRACT | 2 refills | Status: DC | PRN

## 2022-06-24 MED ORDER — ATORVASTATIN CALCIUM 80 MG PO TABS: 80.0000 mg | ORAL_TABLET | Freq: Every day | ORAL | 3 refills | Status: DC

## 2022-06-24 NOTE — Assessment & Plan Note (Addendum)
Well controlled with albuterol as needed Chronic cough likely due to asthma Tessalon PRN for cough

## 2022-06-24 NOTE — Assessment & Plan Note (Addendum)
BP Readings from Last 1 Encounters:  06/24/22 120/70   Well-controlled with Metoprolol Counseled for compliance with the medications Advised DASH diet and moderate exercise/walking, at least 150 mins/week

## 2022-06-24 NOTE — Assessment & Plan Note (Signed)
Ordered PSA after discussing its limitations for prostate cancer screening, including false positive results leading to additional investigations.

## 2022-06-24 NOTE — Progress Notes (Signed)
Established Patient Office Visit  Subjective:  Patient ID: [name] [name], male    DOB: 10-04-1950  Age: 72 y.o. MRN: [id number]  CC:  Chief Complaint  Patient presents with   Annual Exam    HPI [name] [name] is a 72 y.o. male with past medical history of CAD s/p stent placement, ESRD on HD, type 2 DM, HTN and asthma who presents for annual physical.  CAD and HTN: He had stent placement in 12/22, and is currently on DAPT and statin.  He denies any chest pain, dyspnea or palpitations currently.  His BP is well controlled with metoprolol.  He was recently switched from Coreg to metoprolol due to PVCs.  He is planned to see EP Cardiology. He also takes statin for HLD.  Type II DM: He was on insulin regimen in the past, but is currently diet controlled since he has lost about 100 pounds and is on HD for ESRD now.  He denies any fatigue, polyuria or polydipsia currently.  ESRD:  He has AV fistula for HD.  He denies any urinary complaint currently.   He takes pantoprazole for GERD.  Denies any dysphagia or odynophagia currently.      Past Medical History:  Diagnosis Date   Asthma    CAD (coronary artery disease)    Chronic edema    Chronic HFrEF (heart failure with reduced ejection fraction) (HCC)    Depression    Diabetes mellitus ([place])    ESRD on hemodialysis (HCC)    MGUS (monoclonal gammopathy of unknown significance)    Obesity    prior weight loss    Past Surgical History:  Procedure Laterality Date   ABCESS DRAINAGE     on back - possible spider bite   AV FISTULA PLACEMENT Left 12/23/2020   Procedure: LEFT ARM ARTERIOVENOUS (AV) FISTULA CREATION;  Surgeon: Rosetta Posner, MD;  Location: [facility];  Service: Vascular;  Laterality: Left;   [place] Left 02/10/2021   Procedure: LEFT ARM SECOND STAGE BASILIC VEIN TRANSPOSITION;  Surgeon: Rosetta Posner, MD;  Location: [facility];  Service: Vascular;  Laterality: Left;   COLONOSCOPY     CORONARY STENT  INTERVENTION N/A 05/26/2021   Procedure: CORONARY STENT INTERVENTION;  Surgeon: Troy Sine, MD;  Location: [facility];  Service: Cardiovascular;  Laterality: N/A;   INSERTION OF DIALYSIS CATHETER Right 12/23/2020   Procedure: INSERTION OF PALINDROME DIALYSIS CATHETER;  Surgeon: Rosetta Posner, MD;  Location: [facility];  Service: Vascular;  Laterality: Right;   LEFT HEART CATH AND CORONARY ANGIOGRAPHY N/A 05/26/2021   Procedure: LEFT HEART CATH AND CORONARY ANGIOGRAPHY;  Surgeon: Troy Sine, MD;  Location: [facility];  Service: Cardiovascular;  Laterality: N/A;   REMOVAL OF A DIALYSIS CATHETER N/A 05/05/2021   Procedure: MINOR REMOVAL OF A TUNNELED DIALYSIS CATHETER;  Surgeon: Rosetta Posner, MD;  Location: [facility];  Service: Vascular;  Laterality: N/A;    Family History  Problem Relation Age of Onset   Breast cancer Mother        died 54   Emphysema Father        died 25   Diabetes Sister    Diabetes Brother     Social History   Socioeconomic History   Marital status: Married    Spouse name: Not on file   Number of children: Not on file   Years of education: Not on file   Highest education level: Not on file  Occupational History   Not on file  Tobacco Use   Smoking status: Never   Smokeless tobacco: Never  Vaping Use   Vaping Use: Never used  Substance and Sexual Activity   Alcohol use: Not Currently   Drug use: Never   Sexual activity: Not Currently  Other Topics Concern   Not on file  Social History Narrative   Not on file   Social Determinants of Health   Financial Resource Strain: Low Risk  (12/19/2020)   Overall Financial Resource Strain (CARDIA)    Difficulty of Paying Living Expenses: Not hard at all  Food Insecurity: No Food Insecurity (12/19/2020)   Hunger Vital Sign    Worried About Running Out of Food in the Last Year: Never true    [place] in the Last Year: Never true  Transportation Needs: No Transportation Needs (12/19/2020)    PRAPARE - Hydrologist (Medical): No    Lack of Transportation (Non-Medical): No  Physical Activity: Inactive (12/19/2020)   Exercise Vital Sign    Days of Exercise per Week: 0 days    Minutes of Exercise per Session: 0 min  Stress: No Stress Concern Present (12/19/2020)   [place]    Feeling of Stress : Not at all  Social Connections: Moderately Isolated (12/19/2020)   Social Connection and Isolation Panel [NHANES]    Frequency of Communication with Friends and Family: More than three times a week    Frequency of Social Gatherings with Friends and Family: More than three times a week    Attends Religious Services: Never    Marine scientist or Organizations: No    Attends Archivist Meetings: Never    Marital Status: Married  Human resources officer Violence: Not At Risk (12/19/2020)   Humiliation, Afraid, Rape, and Kick questionnaire    Fear of Current or Ex-Partner: No    Emotionally Abused: No    Physically Abused: No    Sexually Abused: No    Outpatient Medications Prior to Visit  Medication Sig Dispense Refill   benzonatate (TESSALON) 100 MG capsule Take 1 capsule (100 mg total) by mouth 2 (two) times daily as needed for cough. 30 capsule 2   clopidogrel (PLAVIX) 75 MG tablet Take 1 tablet (75 mg total) by mouth daily. 30 tablet 11   metoprolol succinate (TOPROL XL) 25 MG 24 hr tablet Take 1 tablet (25 mg total) by mouth in the morning and at bedtime. 180 tablet 1   nitroGLYCERIN (NITROSTAT) 0.4 MG SL tablet Place 1 tablet (0.4 mg total) under the tongue every 5 (five) minutes as needed for chest pain. 30 tablet 12   sevelamer (RENAGEL) 800 MG tablet Take 800 mg by mouth 3 (three) times daily with meals.     albuterol (VENTOLIN HFA) 108 (90 Base) MCG/ACT inhaler INHALE 2 PUFFS BY MOUTH EVERY 4 HOURS AS NEEDED ONLY  IF  YOU  CANT  CATCH  YOUR  BREATH 8 g 0   atorvastatin (LIPITOR) 80  MG tablet Take 1 tablet by mouth once daily 30 tablet 5   pantoprazole (PROTONIX) 40 MG tablet Take 1 tablet by mouth once daily 90 tablet 0   No facility-administered medications prior to visit.    No Known Allergies  ROS Review of Systems  Constitutional:  Negative for chills and fever.  HENT:  Negative for congestion and sore throat.   Eyes:  Negative for  pain and discharge.  Respiratory:  Positive for cough. Negative for shortness of breath.   Cardiovascular:  Negative for chest pain and palpitations.  Gastrointestinal:  Negative for constipation, diarrhea, nausea and vomiting.  Endocrine: Negative for polydipsia and polyuria.  Genitourinary:  Negative for dysuria and hematuria.  Musculoskeletal:  Negative for neck pain and neck stiffness.  Skin:  Negative for rash.  Neurological:  Negative for dizziness, weakness, numbness and headaches.  Psychiatric/Behavioral:  Negative for agitation and behavioral problems.       Objective:    Physical Exam Vitals reviewed.  Constitutional:      General: He is not in acute distress.    Appearance: He is not diaphoretic.  HENT:     Head: Normocephalic and atraumatic.     Nose: Nose normal.     Mouth/Throat:     Mouth: Mucous membranes are moist.  Eyes:     General: No scleral icterus.    Extraocular Movements: Extraocular movements intact.  Cardiovascular:     Rate and Rhythm: Normal rate and regular rhythm.     Pulses: Normal pulses.     Heart sounds: Normal heart sounds. No murmur heard. Pulmonary:     Breath sounds: Normal breath sounds. No wheezing or rales.  Abdominal:     Palpations: Abdomen is soft.     Tenderness: There is no abdominal tenderness.  Musculoskeletal:     Cervical back: Neck supple. No tenderness.     Right lower leg: No edema.     Left lower leg: No edema.  Skin:    General: Skin is warm.     Findings: No rash.     Comments: AV fistula on left UE  Neurological:     General: No focal deficit  present.     Mental Status: He is alert and oriented to person, place, and time.     Sensory: No sensory deficit.     Motor: No weakness.  Psychiatric:        Mood and Affect: Mood normal.        Behavior: Behavior normal.     BP 120/70 (BP Location: Right Arm, Cuff Size: Normal)   Pulse 75   Ht 6' (1.829 m)   Wt 241 lb 3.2 oz (109.4 kg)   SpO2 94%   BMI 32.71 kg/m  Wt Readings from Last 3 Encounters:  06/24/22 241 lb 3.2 oz (109.4 kg)  05/26/22 236 lb (107 kg)  12/24/21 234 lb 3.2 oz (106.2 kg)    Lab Results  Component Value Date   TSH 1.96 04/24/2018   Lab Results  Component Value Date   WBC 6.5 11/05/2021   HGB 12.2 (L) 11/05/2021   HCT 38.9 (L) 11/05/2021   MCV 90.3 11/05/2021   PLT 201 11/05/2021   Lab Results  Component Value Date   NA 138 11/05/2021   K 4.6 11/05/2021   CO2 26 11/05/2021   GLUCOSE 112 (H) 11/05/2021   BUN 63 (H) 11/05/2021   CREATININE 9.15 (H) 11/05/2021   BILITOT 0.7 11/05/2021   ALKPHOS 94 11/05/2021   AST 18 11/05/2021   ALT 16 11/05/2021   PROT 7.8 11/05/2021   ALBUMIN 4.1 11/05/2021   CALCIUM 9.3 11/05/2021   ANIONGAP 15 11/05/2021   GFR 33.04 (L) 04/24/2018   Lab Results  Component Value Date   CHOL 122 08/14/2021   Lab Results  Component Value Date   HDL 49 08/14/2021   Lab Results  Component Value Date  [place] 56 08/14/2021   Lab Results  Component Value Date   TRIG 83 08/14/2021   Lab Results  Component Value Date   CHOLHDL 2.5 08/14/2021   Lab Results  Component Value Date   HGBA1C 5.4 04/14/2022      Assessment & Plan:   Problem List Items Addressed This Visit       Cardiovascular and Mediastinum   Essential hypertension    BP Readings from Last 1 Encounters:  06/24/22 120/70  Well-controlled with Metoprolol Counseled for compliance with the medications Advised DASH diet and moderate exercise/walking, at least 150 mins/week      Relevant Medications   atorvastatin (LIPITOR) 80 MG  tablet   Other Relevant Orders   CBC   CMP14+EGFR   TSH   Coronary artery disease of native heart with stable angina pectoris ([place])    S/p stent placement in 2022 On Plavix and statin Denies any anginal chest pain or dyspnea currently Followed by cardiology      Relevant Medications   atorvastatin (LIPITOR) 80 MG tablet   Other Relevant Orders   Lipid Profile   Magnesium   Chronic diastolic heart failure (HCC)    Has h/o CAD On Metoprolol Appears euvolemic currently      Relevant Medications   atorvastatin (LIPITOR) 80 MG tablet   Other Relevant Orders   Magnesium     Respiratory   Asthma, chronic, mild persistent, uncomplicated    Well controlled with albuterol as needed Chronic cough likely due to asthma Tessalon PRN for cough      Relevant Medications   albuterol (VENTOLIN HFA) 108 (90 Base) MCG/ACT inhaler     Digestive   Gastroesophageal reflux disease    Well controlled with pantoprazole      Relevant Medications   pantoprazole (PROTONIX) 40 MG tablet     Endocrine   Diabetes mellitus type 2 in obese [facility])    Lab Results  Component Value Date   HGBA1C 5.9 10/12/2021  Was on insulin in the past, currently diet controlled since starting HD in 12/2020 Advised to follow diabetic diet On statin F/u CMP and lipid panel Diabetic eye exam: Advised to follow up with Ophthalmology for diabetic eye exam      Relevant Medications   atorvastatin (LIPITOR) 80 MG tablet     Genitourinary   ESRD (end stage renal disease) ([place])    On HD - MWF Has urine output, denies urinary complaints On sevelamer Blood tests from HD center reviewed      Relevant Orders   CBC   CMP14+EGFR     Other   Dependence on renal dialysis ([place])    On HD - MWF for ESRD On sevelamer      Relevant Orders   Magnesium   Encounter for general adult medical examination with abnormal findings - Primary    Physical exam as documented. Fasting blood tests today. Has had Shingrix  and Tdap vaccines at local pharmacy.      Screening for prostate cancer    Ordered PSA after discussing its limitations for prostate cancer screening, including false positive results leading to additional investigations.      Relevant Orders   PSA    Meds ordered this encounter  Medications   albuterol (VENTOLIN HFA) 108 (90 Base) MCG/ACT inhaler    Sig: Inhale 1-2 puffs into the lungs every 6 (six) hours as needed for wheezing or shortness of breath.    Dispense:  8 g  Refill:  2    Needs appt for refills   pantoprazole (PROTONIX) 40 MG tablet    Sig: Take 1 tablet (40 mg total) by mouth daily.    Dispense:  90 tablet    Refill:  3   atorvastatin (LIPITOR) 80 MG tablet    Sig: Take 1 tablet (80 mg total) by mouth daily.    Dispense:  90 tablet    Refill:  3    Follow-up: Return in about 6 months (around 12/25/2022) for HTN and DM.    Lindell Spar, MD

## 2022-06-24 NOTE — Assessment & Plan Note (Signed)
Physical exam as documented. Fasting blood tests today. Has had Shingrix and Tdap vaccines at local pharmacy.

## 2022-06-24 NOTE — Assessment & Plan Note (Signed)
Well controlled with pantoprazole

## 2022-06-24 NOTE — Assessment & Plan Note (Addendum)
On HD - MWF for ESRD On sevelamer

## 2022-06-24 NOTE — Assessment & Plan Note (Signed)
Lab Results  Component Value Date   HGBA1C 5.9 10/12/2021   Was on insulin in the past, currently diet controlled since starting HD in 12/2020 Advised to follow diabetic diet On statin F/u CMP and lipid panel Diabetic eye exam: Advised to follow up with Ophthalmology for diabetic eye exam

## 2022-06-24 NOTE — Patient Instructions (Signed)
Please continue to take medications as prescribed.  Please continue to follow renal diet and perform moderate exercise/walking at least 150 mins/week.

## 2022-06-24 NOTE — Assessment & Plan Note (Signed)
S/p stent placement in 2022 On Plavix and statin Denies any anginal chest pain or dyspnea currently Followed by cardiology

## 2022-06-24 NOTE — Assessment & Plan Note (Addendum)
Has h/o CAD On Metoprolol Appears euvolemic currently

## 2022-06-24 NOTE — Assessment & Plan Note (Addendum)
On HD - MWF Has urine output, denies urinary complaints On sevelamer Blood tests from HD center reviewed

## 2022-06-25 ENCOUNTER — Telehealth: Payer: Self-pay

## 2022-06-25 NOTE — Telephone Encounter (Signed)
Patient notified and verbalized understanding. Patient had no questions or concerns at this time.  

## 2022-06-25 NOTE — Telephone Encounter (Signed)
-----   Message from Jeanann Lewandowsky, [place] sent at 06/25/2022  9:54 AM EST -----  ----- Message ----- From: Jeanann Lewandowsky, RMA Sent: 06/25/2022   6:23 AM EST To: [facility] Triage    ----- Message ----- From: Almyra Deforest, PA Sent: 06/24/2022   5:17 PM EST To: Jeanann Lewandowsky, RMA; Jacqulynn Cadet, CMA  Covering for Dayna Dunn's inbox. Reassuring result, normal pumping function of heart, no significant valve issue.   Will review Echo with Dayna once she is back, although there is mild wall motion abnormality on this echo, however the same wall motion abnormality was also seen on previous echo in Feb 2023 after Mr. Ouderkirk's last heart attack admission. I do not think this is changed. Wall motion is in the inferoseptal location, previous stent was placed in LAD, the right coronary artery had minimal disease.

## 2022-06-26 LAB — MAGNESIUM: Magnesium: 2.2 mg/dL (ref 1.6–2.3)

## 2022-06-26 LAB — CMP14+EGFR
ALT: 20 IU/L (ref 0–44)
AST: 18 IU/L (ref 0–40)
Albumin/Globulin Ratio: 1.6 (ref 1.2–2.2)
Albumin: 4.6 g/dL (ref 3.8–4.8)
Alkaline Phosphatase: 124 IU/L — ABNORMAL HIGH (ref 44–121)
BUN/Creatinine Ratio: 5 — ABNORMAL LOW (ref 10–24)
BUN: 39 mg/dL — ABNORMAL HIGH (ref 8–27)
Bilirubin Total: 0.3 mg/dL (ref 0.0–1.2)
CO2: 27 mmol/L (ref 20–29)
Calcium: 10.1 mg/dL (ref 8.6–10.2)
Chloride: 99 mmol/L (ref 96–106)
Creatinine, Ser: 8.23 mg/dL — ABNORMAL HIGH (ref 0.76–1.27)
Globulin, Total: 2.8 g/dL (ref 1.5–4.5)
Glucose: 101 mg/dL — ABNORMAL HIGH (ref 70–99)
Potassium: 5.2 mmol/L (ref 3.5–5.2)
Sodium: 144 mmol/L (ref 134–144)
Total Protein: 7.4 g/dL (ref 6.0–8.5)
eGFR: 6 mL/min/{1.73_m2} — ABNORMAL LOW (ref >59)

## 2022-06-26 LAB — CBC
Hematocrit: 36.7 % — ABNORMAL LOW (ref 37.5–51.0)
Hemoglobin: 11.9 g/dL — ABNORMAL LOW (ref 13.0–17.7)
MCH: 28.7 pg (ref 26.6–33.0)
MCHC: 32.4 g/dL (ref 31.5–35.7)
MCV: 89 fL (ref 79–97)
Platelets: 201 10*3/uL (ref 150–450)
RBC: 4.14 x10E6/uL (ref 4.14–5.80)
RDW: 13 % (ref 11.6–15.4)
WBC: 5.1 10*3/uL (ref 3.4–10.8)

## 2022-06-26 LAB — LIPID PANEL
Chol/HDL Ratio: 2.6 ratio (ref 0.0–5.0)
Cholesterol, Total: 145 mg/dL (ref 100–199)
HDL: 55 mg/dL (ref >39)
LDL Chol Calc (NIH): 74 mg/dL (ref 0–99)
Triglycerides: 82 mg/dL (ref 0–149)
VLDL Cholesterol Cal: 16 mg/dL (ref 5–40)

## 2022-06-26 LAB — PSA: Prostate Specific Ag, Serum: 2.5 ng/mL (ref 0.0–4.0)

## 2022-06-26 LAB — TSH: TSH: 1.04 u[IU]/mL (ref 0.450–4.500)

## 2022-07-13 ENCOUNTER — Ambulatory Visit (INDEPENDENT_AMBULATORY_CARE_PROVIDER_SITE_OTHER): Payer: PPO | Admitting: Internal Medicine

## 2022-07-13 ENCOUNTER — Encounter: Payer: Self-pay | Admitting: Internal Medicine

## 2022-07-13 DIAGNOSIS — Z0000 Encounter for general adult medical examination without abnormal findings: Secondary | ICD-10-CM | POA: Diagnosis not present

## 2022-07-13 NOTE — Patient Instructions (Signed)
  [name] , Thank you for taking time to come for your Medicare Wellness Visit. I appreciate your ongoing commitment to your health goals. Please review the following plan we discussed and let me know if I can assist you in the future.   These are the goals we discussed: Patient has lost 100 lbs, he is going to continue maintain his weight goal. He is going to obtain records for his vaccinations from [facility] drug store.    This is a list of the screening recommended for you and due dates:  Health Maintenance  Topic Date Due   DTaP/Tdap/Td vaccine (1 - Tdap) Never done   Zoster (Shingles) Vaccine (1 of 2) Never done   COVID-19 Vaccine (4 - 2023-24 season) 12/18/2021   Pneumonia Vaccine (2 of 2 - PCV) 12/29/2021   Medicare Annual Wellness Visit  07/07/2022   Hemoglobin A1C  10/14/2022   Eye exam for diabetics  12/25/2022   Complete foot exam   06/24/2023   Colon Cancer Screening  11/08/2029   Flu Shot  Completed   Hepatitis C Screening: USPSTF Recommendation to screen - Ages 18-79 yo.  Completed   HPV Vaccine  Aged Out

## 2022-07-13 NOTE — Progress Notes (Signed)
Subjective:  This is a telephone encounter between [name] [name] and [name] [name] on 07/13/2022 for AWV. The visit was conducted with the patient located at home and [name] [name] at [facility]. The patient's identity was confirmed using their DOB and current address. The patient has consented to being evaluated through a telephone encounter and understands the associated risks (an examination cannot be done and the patient may need to come in for an appointment) / benefits (allows the patient to remain at home, decreasing exposure to coronavirus).     [name] [name] is a 72 y.o. male who presents for Medicare Annual/Subsequent preventive examination.  Review of Systems    Review of Systems  All other systems reviewed and are negative.   Objective:    There were no vitals filed for this visit. There is no height or weight on file to calculate BMI.     07/13/2022    8:02 AM 11/05/2021    9:40 PM 07/06/2021    4:36 PM 05/25/2021    2:58 PM 05/21/2021    3:04 PM 02/10/2021    7:59 AM 01/13/2021    9:01 AM  Advanced Directives  Does Patient Have a Medical Advance Directive? No Yes No No No No No  Type of Social research officer, government;Living will       Does patient want to make changes to medical advance directive?  No - Patient declined       Copy of [place] in Chart?  No - copy requested       Would patient like information on creating a medical advance directive? Yes (ED - Information included in AVS)  Yes (ED - Information included in AVS) No - Patient declined No - Patient declined  No - Patient declined    Current Medications (verified) Outpatient Encounter Medications as of 07/13/2022  Medication Sig   albuterol (VENTOLIN HFA) 108 (90 Base) MCG/ACT inhaler Inhale 1-2 puffs into the lungs every 6 (six) hours as needed for wheezing or shortness of breath.   atorvastatin (LIPITOR) 80 MG tablet Take 1 tablet (80 mg total) by mouth daily.   benzonatate  (TESSALON) 100 MG capsule Take 1 capsule (100 mg total) by mouth 2 (two) times daily as needed for cough.   clopidogrel (PLAVIX) 75 MG tablet Take 1 tablet (75 mg total) by mouth daily.   metoprolol succinate (TOPROL XL) 25 MG 24 hr tablet Take 1 tablet (25 mg total) by mouth in the morning and at bedtime.   nitroGLYCERIN (NITROSTAT) 0.4 MG SL tablet Place 1 tablet (0.4 mg total) under the tongue every 5 (five) minutes as needed for chest pain.   pantoprazole (PROTONIX) 40 MG tablet Take 1 tablet (40 mg total) by mouth daily.   sevelamer (RENAGEL) 800 MG tablet Take 800 mg by mouth 3 (three) times daily with meals.   No facility-administered encounter medications on file as of 07/13/2022.    Allergies (verified) Patient has no known allergies.   History: Past Medical History:  Diagnosis Date   Asthma    CAD (coronary artery disease)    Chronic edema    Chronic HFrEF (heart failure with reduced ejection fraction) (HCC)    Depression    Diabetes mellitus ([place])    ESRD on hemodialysis (HCC)    MGUS (monoclonal gammopathy of unknown significance)    Obesity    prior weight loss   Past Surgical History:  Procedure Laterality Date   ABCESS DRAINAGE  on back - possible spider bite   AV FISTULA PLACEMENT Left 12/23/2020   Procedure: LEFT ARM ARTERIOVENOUS (AV) FISTULA CREATION;  Surgeon: Rosetta Posner, MD;  Location: [facility];  Service: Vascular;  Laterality: Left;   [place] Left 02/10/2021   Procedure: LEFT ARM SECOND STAGE BASILIC VEIN TRANSPOSITION;  Surgeon: Rosetta Posner, MD;  Location: [facility];  Service: Vascular;  Laterality: Left;   COLONOSCOPY     CORONARY STENT INTERVENTION N/A 05/26/2021   Procedure: CORONARY STENT INTERVENTION;  Surgeon: Troy Sine, MD;  Location: [place] [facility];  Service: Cardiovascular;  Laterality: N/A;   INSERTION OF DIALYSIS CATHETER Right 12/23/2020   Procedure: INSERTION OF PALINDROME DIALYSIS CATHETER;  Surgeon: Rosetta Posner,  MD;  Location: [facility];  Service: Vascular;  Laterality: Right;   LEFT HEART CATH AND CORONARY ANGIOGRAPHY N/A 05/26/2021   Procedure: LEFT HEART CATH AND CORONARY ANGIOGRAPHY;  Surgeon: Troy Sine, MD;  Location: [address] [facility];  Service: Cardiovascular;  Laterality: N/A;   REMOVAL OF A DIALYSIS CATHETER N/A 05/05/2021   Procedure: MINOR REMOVAL OF A TUNNELED DIALYSIS CATHETER;  Surgeon: Rosetta Posner, MD;  Location: [facility];  Service: Vascular;  Laterality: N/A;   Family History  Problem Relation Age of Onset   Breast cancer Mother        died 37   Emphysema Father        died 37   Diabetes Sister    Diabetes Brother    Social History   Socioeconomic History   Marital status: Married    Spouse name: Not on file   Number of children: Not on file   Years of education: Not on file   Highest education level: Not on file  Occupational History   Not on file  Tobacco Use   Smoking status: Never   Smokeless tobacco: Never  Vaping Use   Vaping Use: Never used  Substance and Sexual Activity   Alcohol use: Not Currently   Drug use: Never   Sexual activity: Not Currently  Other Topics Concern   Not on file  Social History Narrative   Not on file   Social Determinants of Health   Financial Resource Strain: Low Risk  (12/19/2020)   Overall Financial Resource Strain (CARDIA)    Difficulty of Paying Living Expenses: Not hard at all  Food Insecurity: No Food Insecurity (12/19/2020)   Hunger Vital Sign    Worried About Running Out of Food in the Last Year: Never true    [place] in the Last Year: Never true  Transportation Needs: No Transportation Needs (12/19/2020)   PRAPARE - Hydrologist (Medical): No    Lack of Transportation (Non-Medical): No  Physical Activity: Inactive (12/19/2020)   Exercise Vital Sign    Days of Exercise per Week: 0 days    Minutes of Exercise per Session: 0 min  Stress: No Stress Concern Present (12/19/2020)    [place]    Feeling of Stress : Not at all  Social Connections: Moderately Isolated (12/19/2020)   Social Connection and Isolation Panel [NHANES]    Frequency of Communication with Friends and Family: More than three times a week    Frequency of Social Gatherings with Friends and Family: More than three times a week    Attends Religious Services: Never    Marine scientist or Organizations: No  Attends Archivist Meetings: Never    Marital Status: Married    Tobacco Counseling Counseling given: Not Answered   Clinical Intake:  Pre-visit preparation completed: Yes  Pain : No/denies pain     Diabetes: Yes CBG done?: No Did pt. bring in CBG monitor from home?: No  How often do you need to have someone help you when you read instructions, pamphlets, or other written materials from your doctor or pharmacy?: 1 - Never What is the last grade level you completed in school?: 12th grade    Activities of Daily Living    07/13/2022    8:05 AM  In your present state of health, do you have any difficulty performing the following activities:  Hearing? 0  Vision? 0  Walking or climbing stairs? 0  Dressing or bathing? 0  Doing errands, shopping? 0    Patient Care Team: Lindell Spar, MD as PCP - General (Internal Medicine) Werner Lean, MD as PCP - Cardiology (Cardiology)  Indicate any recent Medical Services you may have received from other than [facility] providers in the past year (date may be approximate).     Assessment:   This is a routine wellness examination for Gram.  Hearing/Vision screen No results found.  Dietary issues and exercise activities discussed:     Goals Addressed   None    Depression Screen    07/13/2022    8:05 AM 06/24/2022    1:08 PM 12/24/2021    1:54 PM 07/06/2021    4:37 PM 06/23/2021    1:56 PM 12/17/2020    2:27 PM  PHQ 2/9 Scores  PHQ - 2 Score 0  0 0 0 0 0    Fall Risk    07/13/2022    8:05 AM 06/24/2022    1:08 PM 12/24/2021    1:54 PM 07/06/2021    4:36 PM 06/23/2021    1:56 PM  [place] in the past year? 0 0 0 0 0  Number falls in past yr:  0 0 0 0  Injury with Fall?  0 0 0 0  Risk for fall due to :   No Fall Risks No Fall Risks No Fall Risks  Follow up   Falls evaluation completed Falls evaluation completed Falls evaluation completed    [place]:  Any stairs in or around the home? Yes  If so, are there any without handrails? Yes  Home free of loose throw rugs in walkways, pet beds, electrical cords, etc? Yes  Adequate lighting in your home to reduce risk of falls? Yes   ASSISTIVE DEVICES UTILIZED TO PREVENT FALLS:  Life alert? No  Use of a cane, walker or w/c? No  Grab bars in the bathroom? No  Shower chair or bench in shower? No  Elevated toilet seat or a handicapped toilet? No    Cognitive Function:        07/13/2022    8:05 AM 07/06/2021    4:39 PM  6CIT Screen  What Year? 0 points 0 points  What month? 0 points 0 points  What time? 0 points 0 points  Count back from 20 0 points 0 points  Months in reverse 0 points 0 points  Repeat phrase 0 points 2 points  Total Score 0 points 2 points    Immunizations Immunization History  Administered Date(s) Administered   Fluad Quad(high Dose 65+) 12/24/2021   Moderna Covid-19 Vaccine Bivalent Booster  79yrs & up 02/25/2021   Moderna Sars-Covid-2 Vaccination 05/25/2019, 06/22/2019   Pneumococcal Polysaccharide-23 12/29/2020    TDAP status: Due, Education has been provided regarding the importance of this vaccine. Advised may receive this vaccine at local pharmacy or Health Dept. Aware to provide a copy of the vaccination record if obtained from local pharmacy or Health Dept. Verbalized acceptance and understanding.  Flu Vaccine status: Up to date  Pneumococcal vaccine status: Up to date  Covid-19 vaccine status:  Completed vaccines  Qualifies for Shingles Vaccine? Yes   Zostavax completed No   Shingrix Completed?: Yes  Patient is going to obtain records from [facility] and send to office today  Screening Tests Health Maintenance  Topic Date Due   DTaP/Tdap/Td (1 - Tdap) Never done   Zoster Vaccines- Shingrix (1 of 2) Never done   COVID-19 Vaccine (4 - 2023-24 season) 12/18/2021   Pneumonia Vaccine 80+ Years old (2 of 2 - PCV) 12/29/2021   Medicare Annual Wellness (AWV)  07/07/2022   HEMOGLOBIN A1C  10/14/2022   OPHTHALMOLOGY EXAM  12/25/2022   FOOT EXAM  06/24/2023   COLONOSCOPY (Pts 45-74yrs Insurance coverage will need to be confirmed)  11/08/2029   INFLUENZA VACCINE  Completed   Hepatitis C Screening  Completed   HPV VACCINES  Aged Out    Health Maintenance  Health Maintenance Due  Topic Date Due   DTaP/Tdap/Td (1 - Tdap) Never done   Zoster Vaccines- Shingrix (1 of 2) Never done   COVID-19 Vaccine (4 - 2023-24 season) 12/18/2021   Pneumonia Vaccine 68+ Years old (2 of 2 - PCV) 12/29/2021   Medicare Annual Wellness (AWV)  07/07/2022    Colorectal cancer screening: Type of screening: Colonoscopy. Completed 11/09/2019. Repeat every 10 years  Lung Cancer Screening: (Low Dose CT Chest recommended if Age 68-80 years, 30 pack-year currently smoking OR have quit w/in 15years.) does not qualify.    Additional Screening:  Hepatitis C Screening: does not qualify; Completed 07/22/2020  Vision Screening: Recommended annual ophthalmology exams for early detection of glaucoma and other disorders of the eye. Is the patient up to date with their annual eye exam?  Yes  Who is the provider or what is the name of the office in which the patient attends annual eye exams? Dr.Lee, [facility] If pt is not established with a provider, would they like to be referred to a provider to establish care? No .   Dental Screening: Recommended annual dental exams for proper oral hygiene  Community  Resource Referral / Chronic Care Management: CRR required this visit?  No   CCM required this visit?  No      Plan:     I have personally reviewed and noted the following in the patient's chart:   Medical and social history Use of alcohol, tobacco or illicit drugs  Current medications and supplements including opioid prescriptions. Patient is not currently taking opioid prescriptions. Functional ability and status Nutritional status Physical activity Advanced directives List of other physicians Hospitalizations, surgeries, and ER visits in previous 12 months Vitals Screenings to include cognitive, depression, and falls Referrals and appointments  In addition, I have reviewed and discussed with patient certain preventive protocols, quality metrics, and best practice recommendations. A written personalized care plan for preventive services as well as general preventive health recommendations were provided to patient.     [name] Dy, MD   07/13/2022

## 2022-07-18 DIAGNOSIS — N186 End stage renal disease: Secondary | ICD-10-CM | POA: Diagnosis not present

## 2022-07-18 DIAGNOSIS — Z992 Dependence on renal dialysis: Secondary | ICD-10-CM | POA: Diagnosis not present

## 2022-07-19 DIAGNOSIS — Z992 Dependence on renal dialysis: Secondary | ICD-10-CM | POA: Diagnosis not present

## 2022-07-19 DIAGNOSIS — N2581 Secondary hyperparathyroidism of renal origin: Secondary | ICD-10-CM | POA: Diagnosis not present

## 2022-07-19 DIAGNOSIS — D631 Anemia in chronic kidney disease: Secondary | ICD-10-CM | POA: Diagnosis not present

## 2022-07-19 DIAGNOSIS — N186 End stage renal disease: Secondary | ICD-10-CM | POA: Diagnosis not present

## 2022-07-19 DIAGNOSIS — D509 Iron deficiency anemia, unspecified: Secondary | ICD-10-CM | POA: Diagnosis not present

## 2022-08-10 ENCOUNTER — Ambulatory Visit: Payer: PPO | Attending: Internal Medicine | Admitting: Internal Medicine

## 2022-08-10 ENCOUNTER — Encounter: Payer: Self-pay | Admitting: Internal Medicine

## 2022-08-10 VITALS — BP 118/74 | HR 71 | Ht 72.0 in | Wt 242.0 lb

## 2022-08-10 DIAGNOSIS — I493 Ventricular premature depolarization: Secondary | ICD-10-CM

## 2022-08-10 NOTE — Progress Notes (Signed)
HPI [name] [name] is referred today by Ronie Spies, PA-C for evaluation of PVC's. He is a pleasant 72 yo man with an ICM, s/p PCI, ESRD on HD, class 2 CHF, EF 40%, on GDMT. He was found to have PVC's (10K), and NSVT. He is asymptomatic. He is able to mow his yard. He does not have trouble with HD. He notes that he has lost over 100 lbs. No syncope.  No Known Allergies   Current Outpatient Medications  Medication Sig Dispense Refill   albuterol (VENTOLIN HFA) 108 (90 Base) MCG/ACT inhaler Inhale 1-2 puffs into the lungs every 6 (six) hours as needed for wheezing or shortness of breath. 8 g 2   atorvastatin (LIPITOR) 80 MG tablet Take 1 tablet (80 mg total) by mouth daily. 90 tablet 3   benzonatate (TESSALON) 100 MG capsule Take 1 capsule (100 mg total) by mouth 2 (two) times daily as needed for cough. 30 capsule 2   metoprolol succinate (TOPROL XL) 25 MG 24 hr tablet Take 1 tablet (25 mg total) by mouth in the morning and at bedtime. 180 tablet 1   nitroGLYCERIN (NITROSTAT) 0.4 MG SL tablet Place 1 tablet (0.4 mg total) under the tongue every 5 (five) minutes as needed for chest pain. 30 tablet 12   pantoprazole (PROTONIX) 40 MG tablet Take 1 tablet (40 mg total) by mouth daily. 90 tablet 3   sevelamer (RENAGEL) 800 MG tablet Take 800 mg by mouth 3 (three) times daily with meals.     clopidogrel (PLAVIX) 75 MG tablet Take 1 tablet (75 mg total) by mouth daily. (Patient not taking: Reported on 08/10/2022) 30 tablet 11   No current facility-administered medications for this visit.     Past Medical History:  Diagnosis Date   Asthma    CAD (coronary artery disease)    Chronic edema    Chronic HFrEF (heart failure with reduced ejection fraction)    Depression    Diabetes mellitus    ESRD on hemodialysis    MGUS (monoclonal gammopathy of unknown significance)    Obesity    prior weight loss    ROS:   All systems reviewed and negative except as noted in the HPI.   Past Surgical  History:  Procedure Laterality Date   ABCESS DRAINAGE     on back - possible spider bite   AV FISTULA PLACEMENT Left 12/23/2020   Procedure: LEFT ARM ARTERIOVENOUS (AV) FISTULA CREATION;  Surgeon: Larina Earthly, MD;  Location: [facility];  Service: Vascular;  Laterality: Left;   BASCILIC VEIN TRANSPOSITION Left 02/10/2021   Procedure: LEFT ARM SECOND STAGE BASILIC VEIN TRANSPOSITION;  Surgeon: Larina Earthly, MD;  Location: [facility];  Service: Vascular;  Laterality: Left;   COLONOSCOPY     CORONARY STENT INTERVENTION N/A 05/26/2021   Procedure: CORONARY STENT INTERVENTION;  Surgeon: Lennette Bihari, MD;  [facility];  Service: Cardiovascular;  Laterality: N/A;   INSERTION OF DIALYSIS CATHETER Right 12/23/2020   Procedure: INSERTION OF PALINDROME DIALYSIS CATHETER;  Surgeon: Larina Earthly, MD;  Location: [facility];  Service: Vascular;  Laterality: Right;   LEFT HEART CATH AND CORONARY ANGIOGRAPHY N/A 05/26/2021   Procedure: LEFT HEART CATH AND CORONARY ANGIOGRAPHY;  Surgeon: Lennette Bihari, MD;  [facility];  Service: Cardiovascular;  Laterality: N/A;   REMOVAL OF A DIALYSIS CATHETER N/A 05/05/2021   Procedure: MINOR REMOVAL OF A TUNNELED DIALYSIS CATHETER;  Surgeon: Arbie Cookey,  Kristen Loader, MD;  Location: [facility];  Service: Vascular;  Laterality: N/A;     Family History  Problem Relation Age of Onset   Breast cancer Mother        died 33   Emphysema Father        died 34   Diabetes Sister    Diabetes Brother      Social History   Socioeconomic History   Marital status: Married    Spouse name: Not on file   Number of children: Not on file   Years of education: Not on file   Highest education level: Not on file  Occupational History   Not on file  Tobacco Use   Smoking status: Never   Smokeless tobacco: Never  Vaping Use   Vaping Use: Never used  Substance and Sexual Activity   Alcohol use: Not Currently   Drug use: Never   Sexual activity: Not Currently   Other Topics Concern   Not on file  Social History Narrative   Not on file   Social Determinants of Health   Financial Resource Strain: Low Risk  (12/19/2020)   Overall Financial Resource Strain (CARDIA)    Difficulty of Paying Living Expenses: Not hard at all  Food Insecurity: No Food Insecurity (12/19/2020)   Hunger Vital Sign    Worried About Running Out of Food in the Last Year: Never true    Ran Out of Food in the Last Year: Never true  Transportation Needs: No Transportation Needs (12/19/2020)   PRAPARE - Administrator, Civil Service (Medical): No    Lack of Transportation (Non-Medical): No  Physical Activity: Inactive (12/19/2020)   Exercise Vital Sign    Days of Exercise per Week: 0 days    Minutes of Exercise per Session: 0 min  Stress: No Stress Concern Present (12/19/2020)   [employer] of Occupational Health - Occupational Stress Questionnaire    Feeling of Stress : Not at all  Social Connections: Moderately Isolated (12/19/2020)   Social Connection and Isolation Panel [NHANES]    Frequency of Communication with Friends and Family: More than three times a week    Frequency of Social Gatherings with Friends and Family: More than three times a week    Attends Religious Services: Never    Database administrator or Organizations: No    Attends Banker Meetings: Never    Marital Status: Married  Catering manager Violence: Not At Risk (12/19/2020)   Humiliation, Afraid, Rape, and Kick questionnaire    Fear of Current or Ex-Partner: No    Emotionally Abused: No    Physically Abused: No    Sexually Abused: No     BP 118/74   Pulse 71   Ht 6' (1.829 m)   Wt 242 lb (109.8 kg)   SpO2 98%   BMI 32.82 kg/m   Physical Exam:  Well appearing NAD HEENT: Unremarkable Neck:  No JVD, no thyromegally Lymphatics:  No adenopathy Back:  No CVA tenderness Lungs:  Clear with no wheezes HEART:  IRegular rate rhythm, no murmurs, no rubs, no clicks Abd:   soft, positive bowel sounds, no organomegally, no rebound, no guarding Ext:  2 plus pulses, no edema, no cyanosis, no clubbing Skin:  No rashes no nodules Neuro:  CN II through XII intact, motor grossly intact   Assess/Plan PVC's - with 10K burden and being asymptomatic, there is no indicated treatment other than continued maximal tolerated dose of his  beta blocker.  CAD - he denies anginal symptoms and is able to do his yard work. No pain after HD. Chronic systolic heart failure - continue GDMT. ESRD - He tolerated HD without difficulty. I encouraged him to avoid excess fluid intake.  Sharlot Gowda Amilcar Reever,MD

## 2022-08-10 NOTE — Patient Instructions (Signed)
Medication Instructions:  Your physician recommends that you continue on your current medications as directed. Please refer to the Current Medication list given to you today.  *If you need a refill on your cardiac medications before your next appointment, please call your pharmacy*   Lab Work: NONE   If you have labs (blood work) drawn today and your tests are completely normal, you will receive your results only by: MyChart Message (if you have MyChart) OR A paper copy in the mail If you have any lab test that is abnormal or we need to change your treatment, we will call you to review the results.   Testing/Procedures: NONE    Follow-Up: At [facility], you and your health needs are our priority.  As part of our continuing mission to provide you with exceptional heart care, we have created designated Provider Care Teams.  These Care Teams include your primary Cardiologist (physician) and Advanced Practice Providers (APPs -  Physician Assistants and Nurse Practitioners) who all work together to provide you with the care you need, when you need it.  We recommend signing up for the patient portal called "MyChart".  Sign up information is provided on this After Visit Summary.  MyChart is used to connect with patients for Virtual Visits (Telemedicine).  Patients are able to view lab/test results, encounter notes, upcoming appointments, etc.  Non-urgent messages can be sent to your provider as well.   To learn more about what you can do with MyChart, go to [email or web address].    Your next appointment:    As Needed   Provider:   Gregg Taylor, MD    Other Instructions Thank you for choosing [facility]!    

## 2022-08-17 DIAGNOSIS — Z992 Dependence on renal dialysis: Secondary | ICD-10-CM | POA: Diagnosis not present

## 2022-08-17 DIAGNOSIS — N186 End stage renal disease: Secondary | ICD-10-CM | POA: Diagnosis not present

## 2022-08-18 DIAGNOSIS — D631 Anemia in chronic kidney disease: Secondary | ICD-10-CM | POA: Diagnosis not present

## 2022-08-18 DIAGNOSIS — N186 End stage renal disease: Secondary | ICD-10-CM | POA: Diagnosis not present

## 2022-08-18 DIAGNOSIS — D509 Iron deficiency anemia, unspecified: Secondary | ICD-10-CM | POA: Diagnosis not present

## 2022-08-18 DIAGNOSIS — N2581 Secondary hyperparathyroidism of renal origin: Secondary | ICD-10-CM | POA: Diagnosis not present

## 2022-08-18 DIAGNOSIS — Z992 Dependence on renal dialysis: Secondary | ICD-10-CM | POA: Diagnosis not present

## 2022-08-20 DIAGNOSIS — N186 End stage renal disease: Secondary | ICD-10-CM | POA: Diagnosis not present

## 2022-08-20 DIAGNOSIS — Z992 Dependence on renal dialysis: Secondary | ICD-10-CM | POA: Diagnosis not present

## 2022-09-18 DIAGNOSIS — N186 End stage renal disease: Secondary | ICD-10-CM | POA: Diagnosis not present

## 2022-09-18 DIAGNOSIS — Z992 Dependence on renal dialysis: Secondary | ICD-10-CM | POA: Diagnosis not present

## 2022-09-20 DIAGNOSIS — D631 Anemia in chronic kidney disease: Secondary | ICD-10-CM | POA: Diagnosis not present

## 2022-09-20 DIAGNOSIS — N186 End stage renal disease: Secondary | ICD-10-CM | POA: Diagnosis not present

## 2022-09-20 DIAGNOSIS — N2581 Secondary hyperparathyroidism of renal origin: Secondary | ICD-10-CM | POA: Diagnosis not present

## 2022-09-20 DIAGNOSIS — D509 Iron deficiency anemia, unspecified: Secondary | ICD-10-CM | POA: Diagnosis not present

## 2022-09-20 DIAGNOSIS — Z992 Dependence on renal dialysis: Secondary | ICD-10-CM | POA: Diagnosis not present

## 2022-10-11 LAB — HEMOGLOBIN A1C: Hemoglobin A1C: 5.7

## 2022-10-18 ENCOUNTER — Telehealth: Payer: Self-pay | Admitting: Internal Medicine

## 2022-10-18 ENCOUNTER — Other Ambulatory Visit: Payer: Self-pay

## 2022-10-18 DIAGNOSIS — Z992 Dependence on renal dialysis: Secondary | ICD-10-CM | POA: Diagnosis not present

## 2022-10-18 DIAGNOSIS — N186 End stage renal disease: Secondary | ICD-10-CM | POA: Diagnosis not present

## 2022-10-18 DIAGNOSIS — J4530 Mild persistent asthma, uncomplicated: Secondary | ICD-10-CM

## 2022-10-18 DIAGNOSIS — N2581 Secondary hyperparathyroidism of renal origin: Secondary | ICD-10-CM | POA: Diagnosis not present

## 2022-10-18 DIAGNOSIS — D631 Anemia in chronic kidney disease: Secondary | ICD-10-CM | POA: Diagnosis not present

## 2022-10-18 DIAGNOSIS — D509 Iron deficiency anemia, unspecified: Secondary | ICD-10-CM | POA: Diagnosis not present

## 2022-10-18 MED ORDER — ALBUTEROL SULFATE HFA 108 (90 BASE) MCG/ACT IN AERS: 1.0000 | INHALATION_SPRAY | Freq: Four times a day (QID) | RESPIRATORY_TRACT | 2 refills | Status: AC | PRN

## 2022-10-18 NOTE — Telephone Encounter (Signed)
Refills sent to pharmacy. 

## 2022-10-18 NOTE — Telephone Encounter (Signed)
Prescription Request  10/18/2022  LOV: 06/24/2022  What is the name of the medication or equipment? albuterol (VENTOLIN HFA) 108 (90 Base) MCG/ACT inhaler [[id number]   Have you contacted your pharmacy to request a refill? Yes   Which pharmacy would you like this sent to?  [facility]   Patient notified that their request is being sent to the clinical staff for review and that they should receive a response within 2 business days.   Please advise at Mobile [phone] (mobile)

## 2022-11-01 ENCOUNTER — Telehealth: Payer: Self-pay | Admitting: Internal Medicine

## 2022-11-01 ENCOUNTER — Other Ambulatory Visit: Payer: Self-pay | Admitting: Internal Medicine

## 2022-11-01 DIAGNOSIS — J4530 Mild persistent asthma, uncomplicated: Secondary | ICD-10-CM

## 2022-11-01 MED ORDER — BENZONATATE 100 MG PO CAPS: 100.0000 mg | ORAL_CAPSULE | Freq: Two times a day (BID) | ORAL | 2 refills | Status: DC | PRN

## 2022-11-01 NOTE — Telephone Encounter (Signed)
Patient is calling says his cough has returned and is wanting to know if he could have medication sent in for it. Please advise Thank You

## 2022-11-01 NOTE — Telephone Encounter (Signed)
Reviewed med list with pt. Pt reports not taking Toprol XL. Pt requested that medication be called in as a refill. Pt compliant with other meds.

## 2022-11-01 NOTE — Telephone Encounter (Signed)
Pt called in stating he has been a little confused on what medications he should be taking and would like for someone to go over his med list with him.

## 2022-11-01 NOTE — Telephone Encounter (Signed)
Spoke to patient , advised medication was refilled

## 2022-11-03 ENCOUNTER — Telehealth: Payer: Self-pay | Admitting: Physician Assistant

## 2022-11-03 ENCOUNTER — Other Ambulatory Visit: Payer: Self-pay

## 2022-11-03 ENCOUNTER — Encounter (HOSPITAL_COMMUNITY): Payer: Self-pay | Admitting: Emergency Medicine

## 2022-11-03 ENCOUNTER — Emergency Department (HOSPITAL_COMMUNITY)
Admission: EM | Admit: 2022-11-03 | Discharge: 2022-11-03 | Disposition: A | Discharge status: Home / Self Care | Payer: PPO | Attending: Emergency Medicine | Admitting: Emergency Medicine

## 2022-11-03 DIAGNOSIS — Z955 Presence of coronary angioplasty implant and graft: Secondary | ICD-10-CM | POA: Diagnosis not present

## 2022-11-03 DIAGNOSIS — Z992 Dependence on renal dialysis: Secondary | ICD-10-CM | POA: Diagnosis not present

## 2022-11-03 DIAGNOSIS — I509 Heart failure, unspecified: Secondary | ICD-10-CM | POA: Insufficient documentation

## 2022-11-03 DIAGNOSIS — I959 Hypotension, unspecified: Secondary | ICD-10-CM | POA: Insufficient documentation

## 2022-11-03 DIAGNOSIS — E1122 Type 2 diabetes mellitus with diabetic chronic kidney disease: Secondary | ICD-10-CM | POA: Insufficient documentation

## 2022-11-03 DIAGNOSIS — I2510 Atherosclerotic heart disease of native coronary artery without angina pectoris: Secondary | ICD-10-CM | POA: Diagnosis not present

## 2022-11-03 DIAGNOSIS — J45909 Unspecified asthma, uncomplicated: Secondary | ICD-10-CM | POA: Diagnosis not present

## 2022-11-03 DIAGNOSIS — N186 End stage renal disease: Secondary | ICD-10-CM | POA: Insufficient documentation

## 2022-11-03 LAB — CBC
HCT: 36.3 % — ABNORMAL LOW (ref 39.0–52.0)
Hemoglobin: 11.6 g/dL — ABNORMAL LOW (ref 13.0–17.0)
MCH: 29.5 pg (ref 26.0–34.0)
MCHC: 32 g/dL (ref 30.0–36.0)
MCV: 92.4 fL (ref 80.0–100.0)
Platelets: 195 10*3/uL (ref 150–400)
RBC: 3.93 MIL/uL — ABNORMAL LOW (ref 4.22–5.81)
RDW: 13.4 % (ref 11.5–15.5)
WBC: 5.3 10*3/uL (ref 4.0–10.5)
nRBC: 0 % (ref 0.0–0.2)

## 2022-11-03 LAB — COMPREHENSIVE METABOLIC PANEL
ALT: 16 U/L (ref 0–44)
AST: 18 U/L (ref 15–41)
Albumin: 3.3 g/dL — ABNORMAL LOW (ref 3.5–5.0)
Alkaline Phosphatase: 115 U/L (ref 38–126)
Anion gap: 12 (ref 5–15)
BUN: 34 mg/dL — ABNORMAL HIGH (ref 8–23)
CO2: 27 mmol/L (ref 22–32)
Calcium: 8.5 mg/dL — ABNORMAL LOW (ref 8.9–10.3)
Chloride: 95 mmol/L — ABNORMAL LOW (ref 98–111)
Creatinine, Ser: 6.82 mg/dL — ABNORMAL HIGH (ref 0.61–1.24)
GFR, Estimated: 8 mL/min — ABNORMAL LOW (ref >60)
Glucose, Bld: 175 mg/dL — ABNORMAL HIGH (ref 70–99)
Potassium: 3.6 mmol/L (ref 3.5–5.1)
Sodium: 134 mmol/L — ABNORMAL LOW (ref 135–145)
Total Bilirubin: 0.4 mg/dL (ref 0.3–1.2)
Total Protein: 6.7 g/dL (ref 6.5–8.1)

## 2022-11-03 MED ORDER — SODIUM CHLORIDE 0.9 % IV BOLUS: 250.0000 mL | Freq: Once | INTRAVENOUS | Status: DC

## 2022-11-03 NOTE — Telephone Encounter (Signed)
I spoke with [name] [name] in dialysis and she will ask patient to stop metoprolol and to got to the ED for evaluation of hypotension.

## 2022-11-03 NOTE — ED Triage Notes (Signed)
Pt via POV sent from [facility] due to hypotension 80s/40s average during treatment. Pt states they did not withdraw any fluid today but they did dialyze him. Pt is asymptomatic and has no physical complaints at this time.

## 2022-11-03 NOTE — Telephone Encounter (Signed)
With BP that low I would advise ED evaluation and consultation with nephrology as he is not on any other significant meds that we can hold aside from metoprolol which should not affect BP severely but can stop metoprolol for now. Please schedule patient for cardiology follow-up after ED visit as well. Thank you.

## 2022-11-03 NOTE — Discharge Instructions (Addendum)
Please drink slightly more water than the amount allowed per day and have your dialysis on Wednesday as scheduled. Please do not hesitate to return to emergency department if worrisome signs symptoms we discussed become apparent.

## 2022-11-03 NOTE — ED Provider Notes (Signed)
[place] EMERGENCY DEPARTMENT AT [facility] Provider Note   CSN: [id number] Arrival date & time: 11/03/22  1205     History  Chief Complaint  Patient presents with   Hypotension    [name] [name] is a 72 y.o. male history of ESRD on dialysis Monday Wednesday Friday presents today for evaluation of hypotension.  Patient was having dialysis this morning when his blood sugar was dropping into the 80s 90s systolically throughout the session.  Patient was sent here for reevaluation.  He denies any symptoms at this point.  He denies chest pain, shortness of breath, fever, nausea, vomiting, bowel changes, abdominal pain, back pain, blood in his stool or urine.  Per dialysis summary report, patient's blood pressure remain low into 80s and 90s throughout session.  About 3-3.5 liters of fluid was withdrawn.     HPI    Past Medical History:  Diagnosis Date   Asthma    CAD (coronary artery disease)    Chronic edema    Chronic HFrEF (heart failure with reduced ejection fraction) (HCC)    Depression    Diabetes mellitus (HCC)    ESRD on hemodialysis (HCC)    MGUS (monoclonal gammopathy of unknown significance)    Obesity    prior weight loss   Past Surgical History:  Procedure Laterality Date   ABCESS DRAINAGE     on back - possible spider bite   AV FISTULA PLACEMENT Left 12/23/2020   Procedure: LEFT ARM ARTERIOVENOUS (AV) FISTULA CREATION;  Surgeon: Larina Earthly, MD;  Location: [facility];  Service: Vascular;  Laterality: Left;   BASCILIC VEIN TRANSPOSITION Left 02/10/2021   Procedure: LEFT ARM SECOND STAGE BASILIC VEIN TRANSPOSITION;  Surgeon: Larina Earthly, MD;  Location: [facility] OR[facility];  Service: Vascular;  Laterality: Left;   COLONOSCOPY     CORONARY STENT INTERVENTION N/A 05/26/2021   Procedure: CORONARY STENT INTERVENTION;  Surgeon: Lennette Bihari, MD;  Location: [facility] INVASIVE CV LAB;  Service: Cardiovascular;  Laterality: N/A;   INSERTION OF DIALYSIS CATHETER Right 12/23/2020    Procedure: INSERTION OF PALINDROME DIALYSIS CATHETER;  Surgeon: Larina Earthly, MD;  Location: [facility];  Service: Vascular;  Laterality: Right;   LEFT HEART CATH AND CORONARY ANGIOGRAPHY N/A 05/26/2021   Procedure: LEFT HEART CATH AND CORONARY ANGIOGRAPHY;  Surgeon: Lennette Bihari, MD;  Location: [facility];  Service: Cardiovascular;  Laterality: N/A;   REMOVAL OF A DIALYSIS CATHETER N/A 05/05/2021   Procedure: MINOR REMOVAL OF A TUNNELED DIALYSIS CATHETER;  Surgeon: Larina Earthly, MD;  Location: [facility];  Service: Vascular;  Laterality: N/A;     Home Medications Prior to Admission medications   Medication Sig Start Date End Date Taking? Authorizing Provider  albuterol (VENTOLIN HFA) 108 (90 Base) MCG/ACT inhaler Inhale 1-2 puffs into the lungs every 6 (six) hours as needed for wheezing or shortness of breath. 10/18/22   Anabel Halon, MD  atorvastatin (LIPITOR) 80 MG tablet Take 1 tablet (80 mg total) by mouth daily. 06/24/22   Anabel Halon, MD  benzonatate (TESSALON) 100 MG capsule Take 1 capsule (100 mg total) by mouth 2 (two) times daily as needed for cough. 11/01/22   Anabel Halon, MD  clopidogrel (PLAVIX) 75 MG tablet Take 1 tablet (75 mg total) by mouth daily. Patient not taking: Reported on 08/10/2022 05/27/22 05/28/23  Laurann Montana, PA-C  nitroGLYCERIN (NITROSTAT) 0.4 MG SL tablet Place 1 tablet (0.4 mg total) under the tongue every  5 (five) minutes as needed for chest pain. 05/27/21   Marguerita Merles Latif, DO  pantoprazole (PROTONIX) 40 MG tablet Take 1 tablet (40 mg total) by mouth daily. 06/24/22   Anabel Halon, MD  sevelamer (RENAGEL) 800 MG tablet Take 800 mg by mouth 3 (three) times daily with meals.    [provider]      Allergies    Patient has no known allergies.    Review of Systems   Review of Systems Negative except as per HPI.  Physical Exam Updated Vital Signs BP (!) 108/51 (BP Location: Right Arm)   Pulse 65   Temp 98.5 F (36.9 C) (Oral)   Resp 18    Ht 6' (1.829 m)   Wt 109.8 kg   SpO2 98%   BMI 32.82 kg/m  Physical Exam Vitals and nursing note reviewed.  Constitutional:      Appearance: Normal appearance.  HENT:     Head: Normocephalic and atraumatic.     Mouth/Throat:     Mouth: Mucous membranes are moist.  Eyes:     General: No scleral icterus. Cardiovascular:     Rate and Rhythm: Normal rate and regular rhythm.     Pulses: Normal pulses.     Heart sounds: Normal heart sounds.  Pulmonary:     Effort: Pulmonary effort is normal.     Breath sounds: Normal breath sounds.  Abdominal:     General: Abdomen is flat.     Palpations: Abdomen is soft.     Tenderness: There is no abdominal tenderness.  Musculoskeletal:        General: No deformity.  Skin:    General: Skin is warm.     Findings: No rash.  Neurological:     General: No focal deficit present.     Mental Status: He is alert.  Psychiatric:        Mood and Affect: Mood normal.     ED Results / Procedures / Treatments   Labs (all labs ordered are listed, but only abnormal results are displayed) Labs Reviewed  CBC  COMPREHENSIVE METABOLIC PANEL    EKG EKG Interpretation Date/Time:  Wednesday November 03 2022 12:23:33 EDT Ventricular Rate:  70 PR Interval:  226 QRS Duration:  164 QT Interval:  519 QTC Calculation: 588 R Axis:   250  Text Interpretation: Sinus or ectopic atrial rhythm Probable left atrial enlargement Right bundle branch block Abnrm T, consider ischemia, anterolateral lds RBBB new since Feb 2023 Confirmed by Pricilla Loveless [id number]) on 11/03/2022 12:27:51 PM  Radiology No results found.  Procedures Procedures    Medications Ordered in ED Medications - No data to display  ED Course/ Medical Decision Making/ A&P                             Medical Decision Making Amount and/or Complexity of Data Reviewed Labs: ordered.   This patient presents to the ED for hypotension, this involves an extensive number of treatment options,  and is a complaint that carries with a high risk of complications and morbidity.  The differential diagnosis includes GI bleed, renal disease, medication side effect, sepsis.  This is not an exhaustive list.  Lab tests: I ordered and personally interpreted labs.  The pertinent results include: WBC unremarkable. Hbg unremarkable. Platelets unremarkable. Electrolytes unremarkable. BUN 34, creatinine 6.82 around baseline.   Problem list/ ED course/ Critical interventions/ Medical management: HPI: See above Vital signs  significant for blood pressure of 97/57.  Otherwise within normal range and stable throughout visit. Laboratory/imaging studies significant for: See above. On physical examination, patient is afebrile and appears in no acute distress.  Patient is awake alert, well-appearing. Not sepsis. He denies any symptoms at this point.  He denies any vision changes, headache, lightheadedness, chest pain, shortness of breath.  Unclear etiology however patient is currently asymptomatic.  Patient's blood pressure improved after fluid challenge to blood pressure of 114/53.  Patient states he is feeling fine.  Advised patient to drink somebody more fluid than the amount allow per day.  Advised patient to have dialysis as scheduled on Wednesday.  Strict return question discussed. I have reviewed the patient home medicines and have made adjustments as needed.  Cardiac monitoring/EKG: The patient was maintained on a cardiac monitor.  I personally reviewed and interpreted the cardiac monitor which showed an underlying rhythm of: sinus rhythm.  Additional history obtained: External records from outside source obtained and reviewed including: Chart review including previous notes, labs, imaging.  Consultations obtained:  Disposition Continued outpatient therapy. Follow-up with PCP recommended for reevaluation of symptoms. Treatment plan discussed with patient.  Pt acknowledged understanding was agreeable  to the plan. Worrisome signs and symptoms were discussed with patient, and patient acknowledged understanding to return to the ED if they noticed these signs and symptoms. Patient was stable upon discharge.   This chart was dictated using voice recognition software.  Despite best efforts to proofread,  errors can occur which can change the documentation meaning.          Final Clinical Impression(s) / ED Diagnoses Final diagnoses:  Hypotension, unspecified hypotension type    Rx / DC Orders ED Discharge Orders     None         Jeanelle Malling, [place] 11/03/22 1756    Pricilla Loveless, MD 11/05/22 [phone]

## 2022-11-03 NOTE — ED Notes (Signed)
2nd 250 ml of water given to pt.

## 2022-11-03 NOTE — Telephone Encounter (Signed)
Received a call from dialysis nurse and she states that the pt restarted taking Toprol XL and now his BP is 79/40 and 84/43. Pt feel tired but other than that he feels fine. She states that today they will clean his blood but will not pull any fluid off. Please advise.

## 2022-11-03 NOTE — ED Notes (Signed)
 of water given

## 2022-11-12 ENCOUNTER — Telehealth: Payer: Self-pay

## 2022-11-12 NOTE — Telephone Encounter (Signed)
Transition Care Management Unsuccessful Follow-up Telephone Call  Date of discharge and from where:  11/03/2022 [facility]  Attempts:  1st Attempt  Reason for unsuccessful TCM follow-up call:  Voice mail full  Jessenya Berdan [facility]  [facility] Population Health Community Resource Care Guide   [email or web address]  ?? [phone]   Website: [email or web address]  [email or web address]

## 2022-11-15 ENCOUNTER — Telehealth: Payer: Self-pay

## 2022-11-15 NOTE — Telephone Encounter (Signed)
Transition Care Management Follow-up Telephone Call Date of discharge and from where: 11/03/2022 [facility] How have you been since you were released from the hospital? Patient is feeling much better. Any questions or concerns? No  Items Reviewed: Did the pt receive and understand the discharge instructions provided? Yes  Medications obtained and verified? Yes  Other? No  Any new allergies since your discharge? No  Dietary orders reviewed? Yes Do you have support at home? Yes   Follow up appointments reviewed:  PCP Hospital f/u appt confirmed? No  Scheduled to see  on  @ . Specialist Hospital f/u appt confirmed? Yes  Scheduled to see Marjo Bicker, MD on 11/30/2022 @ [facility]. Are transportation arrangements needed? No  If their condition worsens, is the pt aware to call PCP or go to the Emergency Dept.? Yes Was the patient provided with contact information for the PCP's office or ED? Yes Was to pt encouraged to call back with questions or concerns? Yes  Kehaulani Fruin [facility]  [facility] Population Health Community Resource Care Guide   [email or web address]  ?? [phone]   Website: [email or web address]  [email or web address]

## 2022-11-18 DIAGNOSIS — N186 End stage renal disease: Secondary | ICD-10-CM | POA: Diagnosis not present

## 2022-11-18 DIAGNOSIS — Z992 Dependence on renal dialysis: Secondary | ICD-10-CM | POA: Diagnosis not present

## 2022-11-19 DIAGNOSIS — N2581 Secondary hyperparathyroidism of renal origin: Secondary | ICD-10-CM | POA: Diagnosis not present

## 2022-11-19 DIAGNOSIS — Z992 Dependence on renal dialysis: Secondary | ICD-10-CM | POA: Diagnosis not present

## 2022-11-19 DIAGNOSIS — D509 Iron deficiency anemia, unspecified: Secondary | ICD-10-CM | POA: Diagnosis not present

## 2022-11-19 DIAGNOSIS — N186 End stage renal disease: Secondary | ICD-10-CM | POA: Diagnosis not present

## 2022-11-19 DIAGNOSIS — D631 Anemia in chronic kidney disease: Secondary | ICD-10-CM | POA: Diagnosis not present

## 2022-11-30 ENCOUNTER — Encounter: Payer: Self-pay | Admitting: Internal Medicine

## 2022-11-30 ENCOUNTER — Ambulatory Visit: Payer: PPO | Attending: Internal Medicine | Admitting: Internal Medicine

## 2022-11-30 VITALS — BP 130/70 | HR 62 | Ht 72.0 in | Wt 245.0 lb

## 2022-11-30 DIAGNOSIS — E7849 Other hyperlipidemia: Secondary | ICD-10-CM

## 2022-11-30 DIAGNOSIS — E785 Hyperlipidemia, unspecified: Secondary | ICD-10-CM | POA: Insufficient documentation

## 2022-11-30 NOTE — Patient Instructions (Signed)
Medication Instructions:  Your physician recommends that you continue on your current medications as directed. Please refer to the Current Medication list given to you today.  *If you need a refill on your cardiac medications before your next appointment, please call your pharmacy*   Lab Work: None If you have labs (blood work) drawn today and your tests are completely normal, you will receive your results only by: [place] (if you have MyChart) OR A paper copy in the mail If you have any lab test that is abnormal or we need to change your treatment, we will call you to review the results.   Testing/Procedures: None   Follow-Up: At [facility], you and your health needs are our priority.  As part of our continuing mission to provide you with exceptional heart care, we have created designated Provider Care Teams.  These Care Teams include your primary Cardiologist (physician) and Advanced Practice Providers (APPs -  Physician Assistants and Nurse Practitioners) who all work together to provide you with the care you need, when you need it.  We recommend signing up for the patient portal called "MyChart".  Sign up information is provided on this After Visit Summary.  MyChart is used to connect with patients for Virtual Visits (Telemedicine).  Patients are able to view lab/test results, encounter notes, upcoming appointments, etc.  Non-urgent messages can be sent to your provider as well.   To learn more about what you can do with MyChart, go to [email or web address].    Your next appointment:   6 month(s)  Provider:   Claudina Lick, MD    Other Instructions

## 2022-11-30 NOTE — Progress Notes (Signed)
Cardiology Office Note  Date: 11/30/2022   ID: [name], DOB 1950/08/28, MRN [id number]  PCP:  [name] Halon, MD  Cardiologist:  [name] Constant, MD Electrophysiologist:  None   History of Present Illness: [name] [name] is a 72 y.o. 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  Overall doing great, no symptoms.  No angina, DOE, orthopnea, PND, leg swelling, palpitations, dizziness, presyncope, syncope.  Compliant with medications and has no side effects.  Past Medical History:  Diagnosis Date   Asthma    CAD (coronary artery disease)    Chronic edema    Chronic HFrEF (heart failure with reduced ejection fraction) (HCC)    Depression    Diabetes mellitus (HCC)    ESRD on hemodialysis (HCC)    MGUS (monoclonal gammopathy of unknown significance)    Obesity    prior weight loss    Past Surgical History:  Procedure Laterality Date   ABCESS DRAINAGE     on back - possible spider bite   AV FISTULA PLACEMENT Left 12/23/2020   Procedure: LEFT ARM ARTERIOVENOUS (AV) FISTULA CREATION;  Surgeon: [name] Earthly, MD;  Location: [facility];  Service: Vascular;  Laterality: Left;   BASCILIC VEIN TRANSPOSITION Left 02/10/2021   Procedure: LEFT ARM SECOND STAGE BASILIC VEIN TRANSPOSITION;  Surgeon: [name] Earthly, MD;  Location: [facility];  Service: Vascular;  Laterality: Left;   COLONOSCOPY     CORONARY STENT INTERVENTION N/A 05/26/2021   Procedure: CORONARY STENT INTERVENTION;  Surgeon: [name] Bihari, MD;  Location: [facility] INV[facility] CV LAB;  Service: Cardiovascular;  Laterality: N/A;   INSERTION OF DIALYSIS CATHETER Right 12/23/2020   Procedure: INSERTION OF PALINDROME DIALYSIS CATHETER;  Surgeon: [name] Earthly, MD;  Location: [facility];  Service: Vascular;  Laterality: Right;   LEFT HEART CATH AND CORONARY ANGIOGRAPHY N/A 05/26/2021   Procedure: LEFT HEART CATH AND CORONARY ANGIOGRAPHY;   Surgeon: [name] Bihari, MD;  Location: [facility];  Service: Cardiovascular;  Laterality: N/A;   REMOVAL OF A DIALYSIS CATHETER N/A 05/05/2021   Procedure: MINOR REMOVAL OF A TUNNELED DIALYSIS CATHETER;  Surgeon: [name] Earthly, MD;  Location: [facility];  Service: Vascular;  Laterality: N/A;    Current Outpatient Medications  Medication Sig Dispense Refill   albuterol (VENTOLIN HFA) 108 (90 Base) MCG/ACT inhaler Inhale 1-2 puffs into the lungs every 6 (six) hours as needed for wheezing or shortness of breath. 8 g 2   atorvastatin (LIPITOR) 80 MG tablet Take 1 tablet (80 mg total) by mouth daily. 90 tablet 3   benzonatate (TESSALON) 100 MG capsule Take 1 capsule (100 mg total) by mouth 2 (two) times daily as needed for cough. 30 capsule 2   clopidogrel (PLAVIX) 75 MG tablet Take 1 tablet (75 mg total) by mouth daily. 30 tablet 11   metoprolol succinate (TOPROL-XL) 25 MG 24 hr tablet Take 25 mg by mouth in the morning and at bedtime.     midodrine (PROAMATINE) 5 MG tablet Take 5 mg by mouth 3 (three) times a week.     nitroGLYCERIN (NITROSTAT) 0.4 MG SL tablet Place 1 tablet (0.4 mg total) under the tongue every 5 (five) minutes as needed for chest pain. 30 tablet 12   pantoprazole (PROTONIX) 40 MG tablet Take 1 tablet (40 mg total) by mouth daily. 90 tablet 3  sevelamer (RENAGEL) 800 MG tablet Take 800 mg by mouth 3 (three) times daily with meals.     No current facility-administered medications for this visit.   Allergies:  Patient has no known allergies.   Social History: The patient  reports that he has never smoked. He has never used smokeless tobacco. He reports that he does not currently use alcohol. He reports that he does not use drugs.   Family History: The patient's family history includes Breast cancer in his mother; Diabetes in his brother and sister; Emphysema in his father.   ROS:  Please see the history of present illness. Otherwise, complete review of systems is positive  for none.  All other systems are reviewed and negative.   Physical Exam: VS:  Pulse 62   Ht 6' (1.829 m)   Wt 245 lb (111.1 kg)   SpO2 96%   BMI 33.23 kg/m , BMI Body mass index is 33.23 kg/m.  Wt Readings from Last 3 Encounters:  11/30/22 245 lb (111.1 kg)  11/03/22 242 lb (109.8 kg)  08/10/22 242 lb (109.8 kg)    General: Patient appears comfortable at rest. HEENT: Conjunctiva and lids normal, oropharynx clear with moist mucosa. Neck: Supple, no elevated JVP or carotid bruits, no thyromegaly. Lungs: Clear to auscultation, nonlabored breathing at rest. Cardiac: Regular rate and rhythm, no S3 or significant systolic murmur, no pericardial rub. Abdomen: Soft, nontender, no hepatomegaly, bowel sounds present, no guarding or rebound. Extremities: No pitting edema, distal pulses 2+. Skin: Warm and dry. Musculoskeletal: No kyphosis. Neuropsychiatric: Alert and oriented x3, affect grossly appropriate.  Recent Labwork: 06/24/2022: Magnesium 2.2; TSH 1.040 11/03/2022: ALT 16; AST 18; BUN 34; Creatinine, Ser 6.82; Hemoglobin 11.6; Platelets 195; Potassium 3.6; Sodium 134     Component Value Date/Time   CHOL 145 06/24/2022 1359   TRIG 82 06/24/2022 1359   HDL 55 06/24/2022 1359   CHOLHDL 2.6 06/24/2022 1359   CHOLHDL 2.5 08/14/2021 1215   VLDL 17 08/14/2021 1215   LDLCALC 74 06/24/2022 1359     Assessment and Plan:  CAD s/p LAD PCI in 05/2021: Continue Plavix monotherapy, atorvastatin 80 mg nightly, SL NTG 0.4 mg as needed precautions for chest pain provided.  HLD, at goal: Continue atorvastatin 80 mg nightly, goal LDL less than 70.  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  ESRD DD: On midodrine 5 mg 3 times a week (30 minutes prior to dialysis).  Follows with nephrology.    Medication Adjustments/Labs and Tests Ordered: Current medicines are reviewed at length with the patient today.  Concerns regarding medicines are outlined above.     Disposition:  Follow up 6 months  Signed,  [name] Spurr, MD, 11/30/2022 2:06 PM    [facility] [address] [place], [place] [address]

## 2022-12-02 DIAGNOSIS — I509 Heart failure, unspecified: Secondary | ICD-10-CM | POA: Diagnosis not present

## 2022-12-09 DIAGNOSIS — N186 End stage renal disease: Secondary | ICD-10-CM | POA: Diagnosis not present

## 2022-12-09 DIAGNOSIS — Z01818 Encounter for other preprocedural examination: Secondary | ICD-10-CM | POA: Diagnosis not present

## 2022-12-09 DIAGNOSIS — I5189 Other ill-defined heart diseases: Secondary | ICD-10-CM | POA: Diagnosis not present

## 2022-12-19 DIAGNOSIS — Z992 Dependence on renal dialysis: Secondary | ICD-10-CM | POA: Diagnosis not present

## 2022-12-19 DIAGNOSIS — N186 End stage renal disease: Secondary | ICD-10-CM | POA: Diagnosis not present

## 2022-12-20 DIAGNOSIS — D509 Iron deficiency anemia, unspecified: Secondary | ICD-10-CM | POA: Diagnosis not present

## 2022-12-20 DIAGNOSIS — D631 Anemia in chronic kidney disease: Secondary | ICD-10-CM | POA: Diagnosis not present

## 2022-12-20 DIAGNOSIS — Z992 Dependence on renal dialysis: Secondary | ICD-10-CM | POA: Diagnosis not present

## 2022-12-20 DIAGNOSIS — N2581 Secondary hyperparathyroidism of renal origin: Secondary | ICD-10-CM | POA: Diagnosis not present

## 2022-12-20 DIAGNOSIS — N186 End stage renal disease: Secondary | ICD-10-CM | POA: Diagnosis not present

## 2022-12-21 ENCOUNTER — Encounter: Payer: Self-pay | Admitting: Internal Medicine

## 2022-12-22 ENCOUNTER — Telehealth: Payer: Self-pay

## 2022-12-22 DIAGNOSIS — I5022 Chronic systolic (congestive) heart failure: Secondary | ICD-10-CM

## 2022-12-22 NOTE — Telephone Encounter (Signed)
-----   Message from Vishnu P Mallipeddi sent at 12/21/2022  4:44 PM EDT ----- I reviewed the NM stress test performed at [facility] in 11/2022 as a part of pretransplant workup.  Stress test showed no evidence of ischemia but LVEF was down to 37%.  Echocardiogram from 3/24 showed normal LVEF.  Will obtain limited echocardiogram to confirm normal LVEF.

## 2022-12-22 NOTE — Telephone Encounter (Signed)
Patient notified and verbalized understanding. Pt agreeable with limited echocardiogram. Pt had no questions or concerns at this time

## 2022-12-23 ENCOUNTER — Telehealth: Payer: Self-pay

## 2022-12-23 NOTE — Telephone Encounter (Signed)
Spoke to pt who verbalized understanding. Pt had no questions at this time. Spoke to [employer] who stated that [place] office had an opening for Echo on 9/19. She will call pt to confirm this time works for him.

## 2022-12-23 NOTE — Telephone Encounter (Signed)
-----   Message from Vishnu P Mallipeddi sent at 12/23/2022  8:43 AM EDT ----- Regarding: Transplant There is a letter on my desk in [place] stating he is denied transplant due to cardiology and that nephrology at [facility] thinks he needs further work-up. Limited Echo is already ordered. If that shows normal LVEF, he should be cleared for transplant from our standpoint. Please let patient know.

## 2022-12-28 ENCOUNTER — Encounter: Payer: Self-pay | Admitting: Internal Medicine

## 2022-12-28 ENCOUNTER — Ambulatory Visit (INDEPENDENT_AMBULATORY_CARE_PROVIDER_SITE_OTHER): Payer: PPO | Admitting: Internal Medicine

## 2022-12-28 VITALS — BP 128/70 | HR 60 | Ht 72.0 in | Wt 245.6 lb

## 2022-12-28 DIAGNOSIS — I10 Essential (primary) hypertension: Secondary | ICD-10-CM

## 2022-12-28 DIAGNOSIS — Z992 Dependence on renal dialysis: Secondary | ICD-10-CM | POA: Diagnosis not present

## 2022-12-28 DIAGNOSIS — N186 End stage renal disease: Secondary | ICD-10-CM | POA: Diagnosis not present

## 2022-12-28 DIAGNOSIS — Z23 Encounter for immunization: Secondary | ICD-10-CM

## 2022-12-28 DIAGNOSIS — I872 Venous insufficiency (chronic) (peripheral): Secondary | ICD-10-CM | POA: Diagnosis not present

## 2022-12-28 DIAGNOSIS — I953 Hypotension of hemodialysis: Secondary | ICD-10-CM | POA: Insufficient documentation

## 2022-12-28 DIAGNOSIS — E669 Obesity, unspecified: Secondary | ICD-10-CM

## 2022-12-28 DIAGNOSIS — I25118 Atherosclerotic heart disease of native coronary artery with other forms of angina pectoris: Secondary | ICD-10-CM

## 2022-12-28 DIAGNOSIS — E1169 Type 2 diabetes mellitus with other specified complication: Secondary | ICD-10-CM | POA: Diagnosis not present

## 2022-12-28 MED ORDER — TRIAMCINOLONE ACETONIDE 0.1 % EX CREA: 1.0000 | TOPICAL_CREAM | Freq: Two times a day (BID) | CUTANEOUS | 0 refills | Status: DC

## 2022-12-28 NOTE — Assessment & Plan Note (Deleted)
Has h/o CAD On Metoprolol Appears euvolemic currently

## 2022-12-28 NOTE — Assessment & Plan Note (Signed)
Lab Results  Component Value Date   HGBA1C 5.7 10/11/2022   Was on insulin in the past, currently diet controlled since starting HD in 12/2020 Advised to follow diabetic diet On statin F/u CMP and lipid panel Diabetic eye exam: Advised to follow up with Ophthalmology for diabetic eye exam

## 2022-12-28 NOTE — Patient Instructions (Addendum)
Please continue to take medications as prescribed.  Please continue to follow renal diet and perform moderate exercise/walking at least 150 mins/week.  Please apply Kenalog cream over the dark skin over legs.

## 2022-12-28 NOTE — Assessment & Plan Note (Signed)
On Midodrine 5 mg before HD BP wnl now

## 2022-12-28 NOTE — Assessment & Plan Note (Signed)
BP Readings from Last 1 Encounters:  12/28/22 128/70   Well-controlled with Metoprolol Counseled for compliance with the medications Advised DASH diet and moderate exercise/walking, at least 150 mins/week

## 2022-12-28 NOTE — Assessment & Plan Note (Signed)
On HD - MWF for ESRD On sevelamer

## 2022-12-28 NOTE — Progress Notes (Addendum)
Established Patient Office Visit  Subjective:  Patient ID: [name] [name], male    DOB: [birth year]  Age: 72 y.o. MRN: [id number]  CC:  Chief Complaint  Patient presents with   Hypertension    Follow up 6 month   Diabetes    Follow up 6 months    HPI [name] [name] is a 72 y.o. male with past medical history of CAD s/p stent placement, ESRD on HD, type 2 DM, HTN and asthma who presents for f/u of his chronic medical conditions.  CAD and HTN: He had stent placement in 12/22, and is currently on DAPT and statin.  He denies any chest pain, dyspnea or palpitations currently.  His BP is well controlled currently.  He takes metoprolol for history of CAD.  He has been placed on midodrine before HD to avoid hypotension.  He also takes statin for HLD.  Type II DM: He was on insulin regimen in the past, but is currently diet controlled since he has lost about 100 pounds and is on HD for ESRD now.  He denies any fatigue, polyuria or polydipsia currently.  He has AV fistula for HD.  He denies any urinary complaint currently.  He is being evaluated for kidney transplant at [facility].  He had nuclear stress test in 08/24, which showed EF of 37%.  He was referred back to cardiology in [place] and is planned to get limited echo to confirm LVEF (His LVEF was 55% in 03/24).  He takes pantoprazole for GERD.  Denies any dysphagia or odynophagia currently.   Past Medical History:  Diagnosis Date   Asthma    CAD (coronary artery disease)    Chronic edema    Chronic HFrEF (heart failure with reduced ejection fraction) (HCC)    Depression    Diabetes mellitus (HCC)    ESRD on hemodialysis (HCC)    MGUS (monoclonal gammopathy of unknown significance)    Obesity    prior weight loss    Past Surgical History:  Procedure Laterality Date   ABCESS DRAINAGE     on back - possible spider bite   AV FISTULA PLACEMENT Left 12/23/2020   Procedure: LEFT ARM ARTERIOVENOUS (AV) FISTULA CREATION;   Surgeon: Larina Earthly, MD;  Location: [facility];  Service: Vascular;  Laterality: Left;   BASCILIC VEIN TRANSPOSITION Left 02/10/2021   Procedure: LEFT ARM SECOND STAGE BASILIC VEIN TRANSPOSITION;  Surgeon: Larina Earthly, MD;  Location: [facility];  Service: Vascular;  Laterality: Left;   COLONOSCOPY     CORONARY STENT INTERVENTION N/A 05/26/2021   Procedure: CORONARY STENT INTERVENTION;  Surgeon: Lennette Bihari, MD;  [facility];  Service: Cardiovascular;  Laterality: N/A;   INSERTION OF DIALYSIS CATHETER Right 12/23/2020   Procedure: INSERTION OF PALINDROME DIALYSIS CATHETER;  Surgeon: Larina Earthly, MD;  Location: [facility];  Service: Vascular;  Laterality: Right;   LEFT HEART CATH AND CORONARY ANGIOGRAPHY N/A 05/26/2021   Procedure: LEFT HEART CATH AND CORONARY ANGIOGRAPHY;  Surgeon: Lennette Bihari, MD;  [facility]: [facility];  Service: Cardiovascular;  Laterality: N/A;   REMOVAL OF A DIALYSIS CATHETER N/A 05/05/2021   Procedure: MINOR REMOVAL OF A TUNNELED DIALYSIS CATHETER;  Surgeon: Larina Earthly, MD;  Location: [facility];  Service: Vascular;  Laterality: N/A;    Family History  Problem Relation Age of Onset   Breast cancer Mother        died 50   Emphysema Father  died 11   Diabetes Sister    Diabetes Brother     Social History   Socioeconomic History   Marital status: Married    Spouse name: Not on file   Number of children: Not on file   Years of education: Not on file   Highest education level: Not on file  Occupational History   Not on file  Tobacco Use   Smoking status: Never   Smokeless tobacco: Never  Vaping Use   Vaping status: Never Used  Substance and Sexual Activity   Alcohol use: Not Currently   Drug use: Never   Sexual activity: Not Currently  Other Topics Concern   Not on file  Social History Narrative   Not on file   Social Determinants of Health   Financial Resource Strain: Low Risk  (12/19/2020)   Overall Financial Resource  Strain (CARDIA)    Difficulty of Paying Living Expenses: Not hard at all  Food Insecurity: No Food Insecurity (12/19/2020)   Hunger Vital Sign    Worried About Running Out of Food in the Last Year: Never true    Ran Out of Food in the Last Year: Never true  Transportation Needs: No Transportation Needs (12/19/2020)   PRAPARE - Administrator, Civil Service (Medical): No    Lack of Transportation (Non-Medical): No  Physical Activity: Inactive (12/19/2020)   Exercise Vital Sign    Days of Exercise per Week: 0 days    Minutes of Exercise per Session: 0 min  Stress: No Stress Concern Present (12/19/2020)   [employer] of Occupational Health - Occupational Stress Questionnaire    Feeling of Stress : Not at all  Social Connections: Moderately Isolated (12/19/2020)   Social Connection and Isolation Panel [NHANES]    Frequency of Communication with Friends and Family: More than three times a week    Frequency of Social Gatherings with Friends and Family: More than three times a week    Attends Religious Services: Never    Database administrator or Organizations: No    Attends Banker Meetings: Never    Marital Status: Married  Catering manager Violence: Not At Risk (12/19/2020)   Humiliation, Afraid, Rape, and Kick questionnaire    Fear of Current or Ex-Partner: No    Emotionally Abused: No    Physically Abused: No    Sexually Abused: No    Outpatient Medications Prior to Visit  Medication Sig Dispense Refill   albuterol (VENTOLIN HFA) 108 (90 Base) MCG/ACT inhaler Inhale 1-2 puffs into the lungs every 6 (six) hours as needed for wheezing or shortness of breath. 8 g 2   atorvastatin (LIPITOR) 80 MG tablet Take 1 tablet (80 mg total) by mouth daily. 90 tablet 3   benzonatate (TESSALON) 100 MG capsule Take 1 capsule (100 mg total) by mouth 2 (two) times daily as needed for cough. 30 capsule 2   clopidogrel (PLAVIX) 75 MG tablet Take 1 tablet (75 mg total) by mouth daily.  30 tablet 11   metoprolol succinate (TOPROL-XL) 25 MG 24 hr tablet Take 25 mg by mouth in the morning and at bedtime.     midodrine (PROAMATINE) 5 MG tablet Take 5 mg by mouth every other day. Before HD     nitroGLYCERIN (NITROSTAT) 0.4 MG SL tablet Place 1 tablet (0.4 mg total) under the tongue every 5 (five) minutes as needed for chest pain. 30 tablet 12   pantoprazole (PROTONIX) 40 MG tablet Take 1  tablet (40 mg total) by mouth daily. 90 tablet 3   sevelamer (RENAGEL) 800 MG tablet Take 800 mg by mouth 3 (three) times daily with meals.     No facility-administered medications prior to visit.    No Known Allergies  ROS Review of Systems  Constitutional:  Negative for chills and fever.  HENT:  Negative for congestion and sore throat.   Eyes:  Negative for pain and discharge.  Respiratory:  Negative for cough and shortness of breath.   Cardiovascular:  Negative for chest pain and palpitations.  Gastrointestinal:  Negative for constipation, diarrhea, nausea and vomiting.  Endocrine: Negative for polydipsia and polyuria.  Genitourinary:  Negative for dysuria and hematuria.  Musculoskeletal:  Negative for neck pain and neck stiffness.  Skin:  Negative for rash.  Neurological:  Negative for dizziness, weakness, numbness and headaches.  Psychiatric/Behavioral:  Negative for agitation and behavioral problems.       Objective:    Physical Exam Vitals reviewed.  Constitutional:      General: He is not in acute distress.    Appearance: He is not diaphoretic.  HENT:     Head: Normocephalic and atraumatic.     Nose: Nose normal.     Mouth/Throat:     Mouth: Mucous membranes are moist.  Eyes:     General: No scleral icterus.    Extraocular Movements: Extraocular movements intact.  Cardiovascular:     Rate and Rhythm: Normal rate and regular rhythm.     Pulses: Normal pulses.     Heart sounds: Normal heart sounds. No murmur heard. Pulmonary:     Breath sounds: Normal breath  sounds. No wheezing or rales.  Musculoskeletal:     Cervical back: Neck supple. No tenderness.     Right lower leg: No edema.     Left lower leg: No edema.  Skin:    General: Skin is warm.     Findings: No rash.     Comments: AV fistula on left UE  Neurological:     General: No focal deficit present.     Mental Status: He is alert and oriented to person, place, and time.     Sensory: No sensory deficit.     Motor: No weakness.  Psychiatric:        Mood and Affect: Mood normal.        Behavior: Behavior normal.     BP 128/70   Pulse 60   Ht 6' (1.829 m)   Wt 245 lb 9.6 oz (111.4 kg)   SpO2 98%   BMI 33.31 kg/m  Wt Readings from Last 3 Encounters:  12/28/22 245 lb 9.6 oz (111.4 kg)  11/30/22 245 lb (111.1 kg)  11/03/22 242 lb (109.8 kg)    Lab Results  Component Value Date   TSH 1.040 06/24/2022   Lab Results  Component Value Date   WBC 5.3 11/03/2022   HGB 11.6 (L) 11/03/2022   HCT 36.3 (L) 11/03/2022   MCV 92.4 11/03/2022   PLT 195 11/03/2022   Lab Results  Component Value Date   NA 134 (L) 11/03/2022   K 3.6 11/03/2022   CO2 27 11/03/2022   GLUCOSE 175 (H) 11/03/2022   BUN 34 (H) 11/03/2022   CREATININE 6.82 (H) 11/03/2022   BILITOT 0.4 11/03/2022   ALKPHOS 115 11/03/2022   AST 18 11/03/2022   ALT 16 11/03/2022   PROT 6.7 11/03/2022   ALBUMIN 3.3 (L) 11/03/2022   CALCIUM 8.5 (L) 11/03/2022  ANIONGAP 12 11/03/2022   EGFR 6 (L) 06/24/2022   GFR 33.04 (L) 04/24/2018   Lab Results  Component Value Date   CHOL 145 06/24/2022   Lab Results  Component Value Date   HDL 55 06/24/2022   Lab Results  Component Value Date   LDLCALC 74 06/24/2022   Lab Results  Component Value Date   TRIG 82 06/24/2022   Lab Results  Component Value Date   CHOLHDL 2.6 06/24/2022   Lab Results  Component Value Date   HGBA1C 5.7 10/11/2022      Assessment & Plan:   Problem List Items Addressed This Visit       Cardiovascular and Mediastinum   CAD  (coronary artery disease) - Primary (Chronic)    S/p stent placement in 2022 On Plavix and statin Denies any anginal chest pain or dyspnea currently - has SL NTG PRN Followed by cardiology      Essential hypertension    BP Readings from Last 1 Encounters:  12/28/22 128/70   Well-controlled with Metoprolol Counseled for compliance with the medications Advised DASH diet and moderate exercise/walking, at least 150 mins/week      Hemodialysis-associated hypotension    On Midodrine 5 mg before HD BP wnl now        Endocrine   Type 2 diabetes mellitus with obesity (HCC)    Lab Results  Component Value Date   HGBA1C 5.7 10/11/2022   Was on insulin in the past, currently diet controlled since starting HD in 12/2020 Advised to follow diabetic diet On statin F/u CMP and lipid panel Diabetic eye exam: Advised to follow up with Ophthalmology for diabetic eye exam        Musculoskeletal and Integument   Stasis dermatitis    Has darkening of skin over leg area due to chronic leg swelling in the past Kenalog cream prescribed      Relevant Medications   triamcinolone cream (KENALOG) 0.1 %     Genitourinary   ESRD (end stage renal disease) (HCC) (Chronic)    On HD - MWF Has urine output, denies urinary complaints On sevelamer Blood tests from HD center reviewed        Other   Dependence on renal dialysis (HCC)    On HD - MWF for ESRD On sevelamer      Other Visit Diagnoses     Encounter for immunization       Relevant Orders   Flu Vaccine Trivalent High Dose (Fluad) (Completed)        Meds ordered this encounter  Medications   triamcinolone cream (KENALOG) 0.1 %    Sig: Apply 1 Application topically 2 (two) times daily.    Dispense:  30 g    Refill:  0    Follow-up: Return in about 6 months (around 06/27/2023) for Annual physical.    Anabel Halon, MD

## 2022-12-28 NOTE — Assessment & Plan Note (Signed)
On HD - MWF Has urine output, denies urinary complaints On sevelamer Blood tests from HD center reviewed

## 2022-12-28 NOTE — Assessment & Plan Note (Addendum)
S/p stent placement in 2022 On Plavix and statin Denies any anginal chest pain or dyspnea currently - has SL NTG PRN Followed by cardiology

## 2022-12-28 NOTE — Assessment & Plan Note (Signed)
Has darkening of skin over leg area due to chronic leg swelling in the past Kenalog cream prescribed

## 2022-12-30 ENCOUNTER — Ambulatory Visit: Payer: PPO | Admitting: Internal Medicine

## 2023-01-06 ENCOUNTER — Ambulatory Visit: Payer: PPO | Attending: Internal Medicine

## 2023-01-06 DIAGNOSIS — I5022 Chronic systolic (congestive) heart failure: Secondary | ICD-10-CM

## 2023-01-07 LAB — ECHOCARDIOGRAM LIMITED
Area-P 1/2: 2.45 cm2
Calc EF: 46 %
S' Lateral: 3.7 cm
Single Plane A2C EF: 50.6 %
Single Plane A4C EF: 45.6 %

## 2023-01-18 ENCOUNTER — Ambulatory Visit: Payer: PPO | Attending: Internal Medicine | Admitting: Internal Medicine

## 2023-01-18 ENCOUNTER — Encounter: Payer: Self-pay | Admitting: Internal Medicine

## 2023-01-18 VITALS — BP 120/80 | HR 70 | Ht 72.0 in | Wt 249.2 lb

## 2023-01-18 DIAGNOSIS — N186 End stage renal disease: Secondary | ICD-10-CM | POA: Diagnosis not present

## 2023-01-18 DIAGNOSIS — E7849 Other hyperlipidemia: Secondary | ICD-10-CM | POA: Diagnosis not present

## 2023-01-18 DIAGNOSIS — Z79899 Other long term (current) drug therapy: Secondary | ICD-10-CM | POA: Diagnosis not present

## 2023-01-18 DIAGNOSIS — I5022 Chronic systolic (congestive) heart failure: Secondary | ICD-10-CM | POA: Diagnosis not present

## 2023-01-18 DIAGNOSIS — Z992 Dependence on renal dialysis: Secondary | ICD-10-CM | POA: Diagnosis not present

## 2023-01-18 NOTE — Progress Notes (Signed)
Cardiology Office Note  Date: 01/18/2023   ID: [name] [name], DOB [birth year], MRN [id number]  PCP:  [name] Halon, MD  Cardiologist:  [name] Bicker, MD Electrophysiologist:  None   History of Present Illness: [name] [name] is a 72 y.o. 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  Patient follows with kidney transplant team at [facility] in [place].  NM stress test was ordered by the team that showed no ischemia but LVEF was 36% for which the kidney transplant coordinator/nurse reached out to [place] to perform LHC due to abnormal stress test.  But there was no MD documentation stating the same.  I repeated limited echocardiogram that showed LVEF 50 to 55% and no regional wall motion abnormalities.  Patient is here for follow-up visit. Overall doing great  Overall doing great, no symptoms, no angina, DOE, orthopnea, PND, leg swelling, palpitations, dizziness, presyncope and syncope.  He does have chronic SOB but did not notice any recent worsening.  Compliant with medications and has no side effects.  Past Medical History:  Diagnosis Date   Asthma    CAD (coronary artery disease)    Chronic edema    Chronic HFrEF (heart failure with reduced ejection fraction) (HCC)    Depression    Diabetes mellitus (HCC)    ESRD on hemodialysis (HCC)    MGUS (monoclonal gammopathy of unknown significance)    Obesity    prior weight loss    Past Surgical History:  Procedure Laterality Date   ABCESS DRAINAGE     on back - possible spider bite   AV FISTULA PLACEMENT Left 12/23/2020   Procedure: LEFT ARM ARTERIOVENOUS (AV) FISTULA CREATION;  Surgeon: [name] Earthly, MD;  Location: [facility];  Service: Vascular;  Laterality: Left;   BASCILIC VEIN TRANSPOSITION Left 02/10/2021   Procedure: LEFT ARM  SECOND STAGE BASILIC VEIN TRANSPOSITION;  Surgeon: [name] Earthly, MD;  Location: [facility];  Service: Vascular;  Laterality: Left;   COLONOSCOPY     CORONARY STENT INTERVENTION N/A 05/26/2021   Procedure: CORONARY STENT INTERVENTION;  Surgeon: [name] Bihari, MD;  [facility];  Service: Cardiovascular;  Laterality: N/A;   INSERTION OF DIALYSIS CATHETER Right 12/23/2020   Procedure: INSERTION OF PALINDROME DIALYSIS CATHETER;  Surgeon: [name] Earthly, MD;  Location: [facility];  Service: Vascular;  Laterality: Right;   LEFT HEART CATH AND CORONARY ANGIOGRAPHY N/A 05/26/2021   Procedure: LEFT HEART CATH AND CORONARY ANGIOGRAPHY;  Surgeon: [name] Bihari, MD;  [facility];  Service: Cardiovascular;  Laterality: N/A;   REMOVAL OF A DIALYSIS CATHETER N/A 05/05/2021   Procedure: MINOR REMOVAL OF A TUNNELED DIALYSIS CATHETER;  Surgeon: [name] Earthly, MD;  Location: [facility];  Service: Vascular;  Laterality: N/A;    Current Outpatient Medications  Medication Sig Dispense Refill   albuterol (VENTOLIN HFA) 108 (90 Base) MCG/ACT inhaler Inhale 1-2 puffs into the lungs every 6 (six) hours as needed for wheezing or shortness of breath. 8 g 2   atorvastatin (LIPITOR) 80 MG tablet Take 1 tablet (80 mg total) by mouth daily. 90 tablet 3   benzonatate (TESSALON) 100 MG capsule Take 1 capsule (100 mg total) by mouth 2 (two) times daily as needed for cough. 30 capsule 2  clopidogrel (PLAVIX) 75 MG tablet Take 1 tablet (75 mg total) by mouth daily. 30 tablet 11   metoprolol succinate (TOPROL-XL) 25 MG 24 hr tablet Take 25 mg by mouth in the morning and at bedtime.     midodrine (PROAMATINE) 5 MG tablet Take 5 mg by mouth every other day. Before HD     nitroGLYCERIN (NITROSTAT) 0.4 MG SL tablet Place 1 tablet (0.4 mg total) under the tongue every 5 (five) minutes as needed for chest pain. 30 tablet 12   pantoprazole (PROTONIX) 40 MG tablet Take 1 tablet (40 mg total) by mouth daily. 90 tablet 3    sevelamer (RENAGEL) 800 MG tablet Take 800 mg by mouth 3 (three) times daily with meals.     triamcinolone cream (KENALOG) 0.1 % Apply 1 Application topically 2 (two) times daily. 30 g 0   No current facility-administered medications for this visit.   Allergies:  Patient has no known allergies.   Social History: The patient  reports that he has never smoked. He has never used smokeless tobacco. He reports that he does not currently use alcohol. He reports that he does not use drugs.   Family History: The patient's family history includes Breast cancer in his mother; Diabetes in his brother and sister; Emphysema in his father.   ROS:  Please see the history of present illness. Otherwise, complete review of systems is positive for none.  All other systems are reviewed and negative.   Physical Exam: VS:  BP 120/80   Pulse 70   Ht 6' (1.829 m)   Wt 249 lb 3.2 oz (113 kg)   SpO2 96%   BMI 33.80 kg/m , BMI Body mass index is 33.8 kg/m.  Wt Readings from Last 3 Encounters:  01/18/23 249 lb 3.2 oz (113 kg)  12/28/22 245 lb 9.6 oz (111.4 kg)  11/30/22 245 lb (111.1 kg)    General: Patient appears comfortable at rest. HEENT: Conjunctiva and lids normal, oropharynx clear with moist mucosa. Neck: Supple, no elevated JVP or carotid bruits, no thyromegaly. Lungs: Clear to auscultation, nonlabored breathing at rest. Cardiac: Regular rate and rhythm, no S3 or significant systolic murmur, no pericardial rub. Abdomen: Soft, nontender, no hepatomegaly, bowel sounds present, no guarding or rebound. Extremities: No pitting edema, distal pulses 2+. Skin: Warm and dry. Musculoskeletal: No kyphosis. Neuropsychiatric: Alert and oriented x3, affect grossly appropriate.  Recent Labwork: 06/24/2022: Magnesium 2.2; TSH 1.040 11/03/2022: ALT 16; AST 18; BUN 34; Creatinine, Ser 6.82; Hemoglobin 11.6; Platelets 195; Potassium 3.6; Sodium 134     Component Value Date/Time   CHOL 145 06/24/2022 1359   TRIG  82 06/24/2022 1359   HDL 55 06/24/2022 1359   CHOLHDL 2.6 06/24/2022 1359   CHOLHDL 2.5 08/14/2021 1215   VLDL 17 08/14/2021 1215   LDLCALC 74 06/24/2022 1359     Assessment and Plan:  CAD s/p LAD PCI in 05/2021: NM stress test in 2024 showed no evidence of ischemia and LVEF was 36% (ordered by the kidney transplant team at [facility] in [place]) and I was reached out to perform LHC with no MD documentation.  I repeated the limited echocardiogram that showed LVEF 50 to 55%.  Patient has no angina, has chronic SOB but no recent worsening.  He does not need any LHC however it is reasonable to perform LHC if requested by the kidney transplant team for transplant workup.  Patient will reach out to [place] with update.  Otherwise, continue Plavix monotherapy, atorvastatin  80, SL NTG 0.4 mg as needed.  ER precautions for chest pain.  His BMI is 33.80 and has CAD.  He will benefit from Ozempic or Mounjaro, preferably Mounjaro that helps with weight loss.  He lost almost 100 pounds only with dietary intervention in the last 1 to 2 years.  Will place Pharm.D. referral for initiation of Mounjaro (he has self-reported history of diabetes mellitus in the past currently not on medications after he lost weight).  HLD, at goal: Continue statin 80 mg nightly, goal LDL less than 70.  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  ESRD DD: On midodrine 5 mg 3 times a week (30 minutes prior to dialysis), follows with nephrology.    Medication Adjustments/Labs and Tests Ordered: Current medicines are reviewed at length with the patient today.  Concerns regarding medicines are outlined above.    Disposition:  Follow up 6 months  Signed, Gracie Gupta Verne Spurr, MD, 01/18/2023 10:20 AM    [facility] [address] [place], [place] [address]

## 2023-01-18 NOTE — Patient Instructions (Addendum)
Medication Instructions:  Your physician recommends that you continue on your current medications as directed. Please refer to the Current Medication list given to you today.   Labwork: None  Testing/Procedures: None  Follow-Up: Your physician recommends that you schedule a follow-up appointment in: 6 months  Any Other Special Instructions Will Be Listed Below (If Applicable).  Please contact office when you find out from  [facility] lets you know whether you need Heart Cath or not. Thank You  Referral to Pharm D  Thank you for choosing [facility]!      If you need a refill on your cardiac medications before your next appointment, please call your pharmacy.

## 2023-01-19 DIAGNOSIS — D631 Anemia in chronic kidney disease: Secondary | ICD-10-CM | POA: Diagnosis not present

## 2023-01-19 DIAGNOSIS — N2581 Secondary hyperparathyroidism of renal origin: Secondary | ICD-10-CM | POA: Diagnosis not present

## 2023-01-19 DIAGNOSIS — N186 End stage renal disease: Secondary | ICD-10-CM | POA: Diagnosis not present

## 2023-01-19 DIAGNOSIS — Z992 Dependence on renal dialysis: Secondary | ICD-10-CM | POA: Diagnosis not present

## 2023-01-20 ENCOUNTER — Other Ambulatory Visit (HOSPITAL_COMMUNITY): Payer: PPO

## 2023-01-20 DIAGNOSIS — H2513 Age-related nuclear cataract, bilateral: Secondary | ICD-10-CM | POA: Diagnosis not present

## 2023-01-20 DIAGNOSIS — H40033 Anatomical narrow angle, bilateral: Secondary | ICD-10-CM | POA: Diagnosis not present

## 2023-01-20 LAB — HM DIABETES EYE EXAM

## 2023-02-08 ENCOUNTER — Ambulatory Visit: Payer: PPO | Attending: Internal Medicine | Admitting: Pharmacist

## 2023-02-08 DIAGNOSIS — I2510 Atherosclerotic heart disease of native coronary artery without angina pectoris: Secondary | ICD-10-CM | POA: Diagnosis not present

## 2023-02-08 NOTE — Patient Instructions (Signed)

## 2023-02-08 NOTE — Progress Notes (Signed)
Patient ID: [name] [name]                 DOB: [birth year]                    MRN: [id number]     HPI: [name] [name] is a 72 y.o. male patient referred to pharmacy clinic by Dr. Jenene Slicker to initiate GLP1-RA therapy. 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 Most recent BMI 33.7.  He was on insulin regimen in the past, but is currently diet controlled since he has lost about 100 pounds and is on HD for ESRD now. Most recent A1C 5.6. A1C as high as 10.1.  Patient presents today to clinic. We reviewed the risk and benefits of GLP-1 therapy. Advised that it was not studies in HD patients for CV outcomes, but not does mean that he wont get a benefit. He lost > 100 lb over 2 years ago. He is interested in losing more.   Baseline weight and BMI: 249lb 33.7  Diet:  Quick Oatmeal, eggs once a week Meatloaf, fish, spaghetti, ground chuck, broccoli, green peas, green beans doesn't eat vegetable every meal Snack: whole wheat ritz crackers Drink: water Fasts for about 16 hours  Exercise: yard work  Family History:  Family History  Problem Relation Age of Onset   Breast cancer Mother        died 56   Emphysema Father        died 61   Diabetes Sister    Diabetes Brother      Social History: no ETOH, no tobacco, no illict drugs  Labs: Lab Results  Component Value Date   HGBA1C 5.7 10/11/2022    Wt Readings from Last 1 Encounters:  01/18/23 249 lb 3.2 oz (113 kg)    BP Readings from Last 1 Encounters:  01/18/23 120/80   Pulse Readings from Last 1 Encounters:  01/18/23 70       Component Value Date/Time   CHOL 145 06/24/2022 1359   TRIG 82 06/24/2022 1359   HDL 55 06/24/2022 1359   CHOLHDL 2.6 06/24/2022 1359   CHOLHDL 2.5 08/14/2021 1215   VLDL 17 08/14/2021 1215   LDLCALC 74 06/24/2022  1359    Past Medical History:  Diagnosis Date   Asthma    CAD (coronary artery disease)    Chronic edema    Chronic HFrEF (heart failure with reduced ejection fraction) (HCC)    Depression    Diabetes mellitus (HCC)    ESRD on hemodialysis (HCC)    MGUS (monoclonal gammopathy of unknown significance)    Obesity    prior weight loss    Current Outpatient Medications on File Prior to Visit  Medication Sig Dispense Refill   albuterol (VENTOLIN HFA) 108 (90 Base) MCG/ACT inhaler Inhale 1-2 puffs into the lungs every 6 (six) hours as needed for wheezing or shortness of breath. 8 g 2   atorvastatin (LIPITOR) 80 MG tablet Take 1 tablet (80 mg total) by mouth daily. 90 tablet 3   benzonatate (TESSALON) 100 MG capsule Take 1 capsule (100 mg total) by mouth 2 (two) times daily as needed for cough. 30 capsule 2   clopidogrel (PLAVIX) 75 MG tablet Take 1 tablet (75 mg total) by mouth daily. 30 tablet 11   metoprolol  succinate (TOPROL-XL) 25 MG 24 hr tablet Take 25 mg by mouth in the morning and at bedtime.     midodrine (PROAMATINE) 5 MG tablet Take 5 mg by mouth every other day. Before HD     nitroGLYCERIN (NITROSTAT) 0.4 MG SL tablet Place 1 tablet (0.4 mg total) under the tongue every 5 (five) minutes as needed for chest pain. 30 tablet 12   pantoprazole (PROTONIX) 40 MG tablet Take 1 tablet (40 mg total) by mouth daily. 90 tablet 3   sevelamer (RENAGEL) 800 MG tablet Take 800 mg by mouth 3 (three) times daily with meals.     triamcinolone cream (KENALOG) 0.1 % Apply 1 Application topically 2 (two) times daily. 30 g 0   No current facility-administered medications on file prior to visit.    No Known Allergies   Assessment/Plan:  1. Weight loss/DM - Patient has lost > 100 with lifestyle. He does have hx of DM with an A1C as high as 10. He has CAD and would benefit from GLP-1. He in still technically obese and would like to loose more weight. Confirmed patient has no personal or family  history of medullary thyroid carcinoma (MTC) or Multiple Endocrine Neoplasia syndrome type 2 (MEN 2). No hx of pancreatitis or gallstones. Injection technique reviewed at today's visit. He is aware of potential cost of medication and is ok with cost.   Advised patient on common side effects including nausea, diarrhea, dyspepsia, decreased appetite, and fatigue. Counseled patient on reducing meal size and how to titrate medication to minimize side effects. Counseled patient to call if intolerable side effects or if experiencing dehydration, abdominal pain, or dizziness. Patient will adhere to dietary modifications and will target at least 150 minutes of moderate intensity exercise weekly.   Follow up in 1 month via telephone for tolerability update and dose titration. Due to renal function will titrate dose cautiously. Will submit PA for [facility].   Thank you,  Olene Floss, Pharm.D, BCACP, BCPS, CPP [facility] [address] [place], [place] [address]  Phone: [phone]; Fax: [phone]

## 2023-02-09 ENCOUNTER — Telehealth: Payer: Self-pay | Admitting: Pharmacy Technician

## 2023-02-09 ENCOUNTER — Other Ambulatory Visit (HOSPITAL_COMMUNITY): Payer: Self-pay

## 2023-02-09 MED ORDER — MOUNJARO 2.5 MG/0.5ML ~~LOC~~ SOAJ: 2.5000 mg | SUBCUTANEOUS | 0 refills | Status: DC

## 2023-02-09 NOTE — Telephone Encounter (Signed)
Patient made aware of approval. Rx sent to pharmacy. I will call patient in 3-4 weeks to f/u

## 2023-02-09 NOTE — Telephone Encounter (Signed)
Pharmacy Patient Advocate Encounter  Received notification from [facility] ADVANTAGE/RX ADVANCE that Prior Authorization for mounjaro has been APPROVED from 01/30/23 to 02/09/24. Ran test claim, Copay is $47.00- one month. This test claim was processed through [facility]- copay amounts may vary at other pharmacies due to pharmacy/plan contracts, or as the patient moves through the different stages of their insurance plan.   PA #/Case ID/Reference #: [id number]

## 2023-02-09 NOTE — Telephone Encounter (Signed)
Pharmacy Patient Advocate Encounter   Received notification from Pt Calls Messages that prior authorization for mounjaro is required/requested.   Insurance verification completed.   The patient is insured through [facility] ADVANTAGE/RX ADVANCE .   Per test claim: PA required; PA submitted to [facility] ADVANTAGE/RX ADVANCE via CoverMyMeds Key/confirmation #/EOC [id number] Status is pending

## 2023-02-09 NOTE — Addendum Note (Signed)
Addended by: Malena Peer D on: 02/09/2023 12:49 PM   Modules accepted: Orders

## 2023-02-09 NOTE — Telephone Encounter (Signed)
-----   Message from Olene Floss sent at 02/09/2023  8:24 AM EDT ----- Please submit for [facility]- dx is DM. thanks

## 2023-02-10 DIAGNOSIS — J45909 Unspecified asthma, uncomplicated: Secondary | ICD-10-CM | POA: Diagnosis not present

## 2023-02-10 DIAGNOSIS — R911 Solitary pulmonary nodule: Secondary | ICD-10-CM | POA: Diagnosis not present

## 2023-02-10 DIAGNOSIS — N186 End stage renal disease: Secondary | ICD-10-CM | POA: Diagnosis not present

## 2023-02-18 ENCOUNTER — Telehealth: Payer: Self-pay | Admitting: Internal Medicine

## 2023-02-18 DIAGNOSIS — D631 Anemia in chronic kidney disease: Secondary | ICD-10-CM | POA: Diagnosis not present

## 2023-02-18 DIAGNOSIS — N186 End stage renal disease: Secondary | ICD-10-CM | POA: Diagnosis not present

## 2023-02-18 DIAGNOSIS — N2581 Secondary hyperparathyroidism of renal origin: Secondary | ICD-10-CM | POA: Diagnosis not present

## 2023-02-18 DIAGNOSIS — Z992 Dependence on renal dialysis: Secondary | ICD-10-CM | POA: Diagnosis not present

## 2023-02-18 DIAGNOSIS — D509 Iron deficiency anemia, unspecified: Secondary | ICD-10-CM | POA: Diagnosis not present

## 2023-02-18 NOTE — Telephone Encounter (Signed)
Patient stated he will be getting a kidney transplant and will need his last office visit, EKG and last labs faxed to [facility], [address], [place], Attn: Dr. Chanetta Marshall, fax number [phone].

## 2023-03-03 ENCOUNTER — Telehealth: Payer: Self-pay | Admitting: Pharmacist

## 2023-03-03 NOTE — Telephone Encounter (Signed)
Called pt to follow up on how he was doing on Mounjaro. Patient stated he decided he didn't want to take it.

## 2023-03-04 DIAGNOSIS — I259 Chronic ischemic heart disease, unspecified: Secondary | ICD-10-CM | POA: Diagnosis not present

## 2023-03-08 ENCOUNTER — Other Ambulatory Visit: Payer: Self-pay | Admitting: Internal Medicine

## 2023-03-08 DIAGNOSIS — J4530 Mild persistent asthma, uncomplicated: Secondary | ICD-10-CM

## 2023-03-20 ENCOUNTER — Other Ambulatory Visit: Payer: Self-pay | Admitting: Physician Assistant

## 2023-03-20 DIAGNOSIS — N186 End stage renal disease: Secondary | ICD-10-CM | POA: Diagnosis not present

## 2023-03-20 DIAGNOSIS — Z992 Dependence on renal dialysis: Secondary | ICD-10-CM | POA: Diagnosis not present

## 2023-03-21 DIAGNOSIS — D509 Iron deficiency anemia, unspecified: Secondary | ICD-10-CM | POA: Diagnosis not present

## 2023-03-21 DIAGNOSIS — N186 End stage renal disease: Secondary | ICD-10-CM | POA: Diagnosis not present

## 2023-03-21 DIAGNOSIS — N2581 Secondary hyperparathyroidism of renal origin: Secondary | ICD-10-CM | POA: Diagnosis not present

## 2023-03-21 DIAGNOSIS — Z992 Dependence on renal dialysis: Secondary | ICD-10-CM | POA: Diagnosis not present

## 2023-03-21 DIAGNOSIS — D631 Anemia in chronic kidney disease: Secondary | ICD-10-CM | POA: Diagnosis not present

## 2023-03-24 DIAGNOSIS — J45909 Unspecified asthma, uncomplicated: Secondary | ICD-10-CM | POA: Diagnosis not present

## 2023-04-14 DIAGNOSIS — J45909 Unspecified asthma, uncomplicated: Secondary | ICD-10-CM | POA: Diagnosis not present

## 2023-04-15 LAB — HEMOGLOBIN A1C: Hemoglobin A1C: 5.7

## 2023-04-15 LAB — LIPID PANEL: Cholesterol: 107 (ref 0–200)

## 2023-04-17 DIAGNOSIS — J45909 Unspecified asthma, uncomplicated: Secondary | ICD-10-CM | POA: Diagnosis not present

## 2023-04-20 DIAGNOSIS — D509 Iron deficiency anemia, unspecified: Secondary | ICD-10-CM | POA: Diagnosis not present

## 2023-04-20 DIAGNOSIS — Z992 Dependence on renal dialysis: Secondary | ICD-10-CM | POA: Diagnosis not present

## 2023-04-20 DIAGNOSIS — N186 End stage renal disease: Secondary | ICD-10-CM | POA: Diagnosis not present

## 2023-04-20 DIAGNOSIS — N2581 Secondary hyperparathyroidism of renal origin: Secondary | ICD-10-CM | POA: Diagnosis not present

## 2023-04-20 DIAGNOSIS — D631 Anemia in chronic kidney disease: Secondary | ICD-10-CM | POA: Diagnosis not present

## 2023-05-16 LAB — BASIC METABOLIC PANEL: Glucose: 120

## 2023-05-21 DIAGNOSIS — N186 End stage renal disease: Secondary | ICD-10-CM | POA: Diagnosis not present

## 2023-05-21 DIAGNOSIS — Z992 Dependence on renal dialysis: Secondary | ICD-10-CM | POA: Diagnosis not present

## 2023-05-23 DIAGNOSIS — Z992 Dependence on renal dialysis: Secondary | ICD-10-CM | POA: Diagnosis not present

## 2023-05-23 DIAGNOSIS — D509 Iron deficiency anemia, unspecified: Secondary | ICD-10-CM | POA: Diagnosis not present

## 2023-05-23 DIAGNOSIS — D631 Anemia in chronic kidney disease: Secondary | ICD-10-CM | POA: Diagnosis not present

## 2023-05-23 DIAGNOSIS — N2581 Secondary hyperparathyroidism of renal origin: Secondary | ICD-10-CM | POA: Diagnosis not present

## 2023-05-23 DIAGNOSIS — N186 End stage renal disease: Secondary | ICD-10-CM | POA: Diagnosis not present

## 2023-05-30 LAB — LAB REPORT - SCANNED: A1c: 5.7

## 2023-05-30 LAB — CBC AND DIFFERENTIAL: Hemoglobin: 10.5 — AB (ref 13.5–17.5)

## 2023-05-30 LAB — BASIC METABOLIC PANEL: Potassium: 4.4 meq/L (ref 3.5–5.1)

## 2023-06-07 ENCOUNTER — Ambulatory Visit: Payer: PPO | Admitting: Internal Medicine

## 2023-06-18 DIAGNOSIS — N2581 Secondary hyperparathyroidism of renal origin: Secondary | ICD-10-CM | POA: Diagnosis not present

## 2023-06-18 DIAGNOSIS — N186 End stage renal disease: Secondary | ICD-10-CM | POA: Diagnosis not present

## 2023-06-18 DIAGNOSIS — Z992 Dependence on renal dialysis: Secondary | ICD-10-CM | POA: Diagnosis not present

## 2023-06-18 DIAGNOSIS — D509 Iron deficiency anemia, unspecified: Secondary | ICD-10-CM | POA: Diagnosis not present

## 2023-06-18 DIAGNOSIS — D631 Anemia in chronic kidney disease: Secondary | ICD-10-CM | POA: Diagnosis not present

## 2023-06-27 ENCOUNTER — Other Ambulatory Visit: Payer: Self-pay | Admitting: Physician Assistant

## 2023-06-27 ENCOUNTER — Other Ambulatory Visit: Payer: Self-pay | Admitting: Internal Medicine

## 2023-06-27 DIAGNOSIS — I493 Ventricular premature depolarization: Secondary | ICD-10-CM

## 2023-06-27 DIAGNOSIS — K219 Gastro-esophageal reflux disease without esophagitis: Secondary | ICD-10-CM

## 2023-06-28 ENCOUNTER — Encounter: Payer: Self-pay | Admitting: Internal Medicine

## 2023-06-28 ENCOUNTER — Ambulatory Visit: Payer: PPO | Admitting: Internal Medicine

## 2023-06-28 VITALS — BP 136/60 | HR 60 | Ht 72.0 in | Wt 246.4 lb

## 2023-06-28 DIAGNOSIS — N186 End stage renal disease: Secondary | ICD-10-CM

## 2023-06-28 DIAGNOSIS — I25118 Atherosclerotic heart disease of native coronary artery with other forms of angina pectoris: Secondary | ICD-10-CM | POA: Diagnosis not present

## 2023-06-28 DIAGNOSIS — Z0001 Encounter for general adult medical examination with abnormal findings: Secondary | ICD-10-CM | POA: Diagnosis not present

## 2023-06-28 DIAGNOSIS — K219 Gastro-esophageal reflux disease without esophagitis: Secondary | ICD-10-CM

## 2023-06-28 DIAGNOSIS — I5032 Chronic diastolic (congestive) heart failure: Secondary | ICD-10-CM

## 2023-06-28 DIAGNOSIS — E1169 Type 2 diabetes mellitus with other specified complication: Secondary | ICD-10-CM | POA: Diagnosis not present

## 2023-06-28 DIAGNOSIS — E669 Obesity, unspecified: Secondary | ICD-10-CM | POA: Diagnosis not present

## 2023-06-28 DIAGNOSIS — E119 Type 2 diabetes mellitus without complications: Secondary | ICD-10-CM

## 2023-06-28 DIAGNOSIS — Z992 Dependence on renal dialysis: Secondary | ICD-10-CM

## 2023-06-28 DIAGNOSIS — I5020 Unspecified systolic (congestive) heart failure: Secondary | ICD-10-CM

## 2023-06-28 MED ORDER — PANTOPRAZOLE SODIUM 40 MG PO TBEC: 40.0000 mg | DELAYED_RELEASE_TABLET | Freq: Every day | ORAL | 3 refills | Status: AC

## 2023-06-28 MED ORDER — ATORVASTATIN CALCIUM 80 MG PO TABS
80.0000 mg | ORAL_TABLET | Freq: Every day | ORAL | 3 refills | Status: AC
Start: 2023-06-28 — End: ?

## 2023-06-28 NOTE — Progress Notes (Unsigned)
 Established Patient Office Visit  Subjective:  Patient ID: [name] [name], male    DOB: Feb 04, 1951  Age: 73 y.o. MRN: [id number]  CC:  Chief Complaint  Patient presents with   Annual Exam    Cpe    HPI [name] [name] is a 73 y.o. male with past medical history of CAD s/p stent placement, ESRD on HD, type 2 DM, HTN and asthma who presents for annual physical.  CAD and HTN: He had stent placement in 12/22, and is currently on DAPT and statin.  He denies any chest pain, dyspnea or palpitations currently.  His BP is well controlled currently.  He takes metoprolol for history of CAD.  He has been placed on midodrine before HD to avoid hypotension.  He also takes statin for HLD.  Type II DM: He was on insulin regimen in the past, but is currently diet controlled since he has lost about 100 pounds and is on HD for ESRD now.  He denies any fatigue, polyuria or polydipsia currently.  He has AV fistula for HD.  He denies any urinary complaint currently.  He is being evaluated for kidney transplant at [facility].  He had nuclear stress test in 08/24, which showed EF of 37%.  He was referred back to cardiology in [place].  Later, he had Echo in 09/24, which showed LVEF of 50-55%.   He takes pantoprazole for GERD.  Denies any dysphagia or odynophagia currently.       Past Medical History:  Diagnosis Date   Asthma    CAD (coronary artery disease)    Chronic edema    Chronic HFrEF (heart failure with reduced ejection fraction) (HCC)    Depression    Diabetes mellitus (HCC)    ESRD on hemodialysis (HCC)    MGUS (monoclonal gammopathy of unknown significance)    Obesity    prior weight loss    Past Surgical History:  Procedure Laterality Date   ABCESS DRAINAGE     on back - possible spider bite   AV FISTULA PLACEMENT Left 12/23/2020   Procedure: LEFT ARM ARTERIOVENOUS (AV) FISTULA CREATION;  Surgeon: Larina Earthly, MD;  Location: [facility];  Service: Vascular;  Laterality: Left;    BASCILIC VEIN TRANSPOSITION Left 02/10/2021   Procedure: LEFT ARM SECOND STAGE BASILIC VEIN TRANSPOSITION;  Surgeon: Larina Earthly, MD;  Location: [facility];  Service: Vascular;  Laterality: Left;   COLONOSCOPY     CORONARY STENT INTERVENTION N/A 05/26/2021   Procedure: CORONARY STENT INTERVENTION;  Surgeon: Lennette Bihari, MD;  Location: [facility] INVAS[facility] CV [facility];  Service: Cardiovascular;  Laterality: N/A;   INSERTION OF DIALYSIS CATHETER Right 12/23/2020   Procedure: INSERTION OF PALINDROME DIALYSIS CATHETER;  Surgeon: Larina Earthly, MD;  Location: [facility];  Service: Vascular;  Laterality: Right;   LEFT HEART CATH AND CORONARY ANGIOGRAPHY N/A 05/26/2021   Procedure: LEFT HEART CATH AND CORONARY ANGIOGRAPHY;  Surgeon: Lennette Bihari, MD;  Location: [facility] INVAS[facility] CV LAB;  Service: Cardiovascular;  Laterality: N/A;   REMOVAL OF A DIALYSIS CATHETER N/A 05/05/2021   Procedure: MINOR REMOVAL OF A TUNNELED DIALYSIS CATHETER;  Surgeon: Larina Earthly, MD;  Location: [facility];  Service: Vascular;  Laterality: N/A;    Family History  Problem Relation Age of Onset   Breast cancer Mother        died 37   Emphysema Father        died 44   Diabetes Sister    Diabetes  Brother     Social History   Socioeconomic History   Marital status: Married    Spouse name: Not on file   Number of children: Not on file   Years of education: Not on file   Highest education level: Not on file  Occupational History   Not on file  Tobacco Use   Smoking status: Never   Smokeless tobacco: Never  Vaping Use   Vaping status: Never Used  Substance and Sexual Activity   Alcohol use: Not Currently   Drug use: Never   Sexual activity: Not Currently  Other Topics Concern   Not on file  Social History Narrative   Not on file   Social Drivers of Health   Financial Resource Strain: Low Risk  (12/19/2020)   Overall Financial Resource Strain (CARDIA)    Difficulty of Paying Living Expenses: Not hard at all  Food Insecurity: No  Food Insecurity (12/19/2020)   Hunger Vital Sign    Worried About Running Out of Food in the Last Year: Never true    Ran Out of Food in the Last Year: Never true  Transportation Needs: No Transportation Needs (12/19/2020)   PRAPARE - Administrator, Civil Service (Medical): No    Lack of Transportation (Non-Medical): No  Physical Activity: Inactive (12/19/2020)   Exercise Vital Sign    Days of Exercise per Week: 0 days    Minutes of Exercise per Session: 0 min  Stress: No Stress Concern Present (12/19/2020)   [employer] of Occupational Health - Occupational Stress Questionnaire    Feeling of Stress : Not at all  Social Connections: Moderately Isolated (12/19/2020)   Social Connection and Isolation Panel [NHANES]    Frequency of Communication with Friends and Family: More than three times a week    Frequency of Social Gatherings with Friends and Family: More than three times a week    Attends Religious Services: Never    Database administrator or Organizations: No    Attends Banker Meetings: Never    Marital Status: Married  Catering manager Violence: Not At Risk (12/19/2020)   Humiliation, Afraid, Rape, and Kick questionnaire    Fear of Current or Ex-Partner: No    Emotionally Abused: No    Physically Abused: No    Sexually Abused: No    Outpatient Medications Prior to Visit  Medication Sig Dispense Refill   albuterol (VENTOLIN HFA) 108 (90 Base) MCG/ACT inhaler Inhale 1-2 puffs into the lungs every 6 (six) hours as needed for wheezing or shortness of breath. 8 g 2   benzonatate (TESSALON) 100 MG capsule TAKE 1 CAPSULE BY MOUTH TWICE DAILY AS NEEDED FOR COUGH 30 capsule 0   budesonide-formoterol (BREYNA) 160-4.5 MCG/ACT inhaler Inhale 2 puffs into the lungs 2 (two) times daily.     metoprolol succinate (TOPROL-XL) 25 MG 24 hr tablet TAKE 1 TABLET BY MOUTH IN THE MORNING AND AT BEDTIME 180 tablet 3   midodrine (PROAMATINE) 5 MG tablet Take 5 mg by mouth every  other day. Before HD     nitroGLYCERIN (NITROSTAT) 0.4 MG SL tablet Place 1 tablet (0.4 mg total) under the tongue every 5 (five) minutes as needed for chest pain. 30 tablet 12   sevelamer (RENAGEL) 800 MG tablet Take 800 mg by mouth 3 (three) times daily with meals.     triamcinolone cream (KENALOG) 0.1 % Apply 1 Application topically 2 (two) times daily. 30 g 0   atorvastatin (LIPITOR)  80 MG tablet Take 1 tablet (80 mg total) by mouth daily. 90 tablet 3   pantoprazole (PROTONIX) 40 MG tablet Take 1 tablet by mouth once daily 90 tablet 0   No facility-administered medications prior to visit.    No Known Allergies  ROS Review of Systems  Constitutional:  Negative for chills and fever.  HENT:  Negative for congestion and sore throat.   Eyes:  Negative for pain and discharge.  Respiratory:  Positive for cough. Negative for shortness of breath.   Cardiovascular:  Negative for chest pain and palpitations.  Gastrointestinal:  Negative for constipation, diarrhea, nausea and vomiting.  Endocrine: Negative for polydipsia and polyuria.  Genitourinary:  Negative for dysuria and hematuria.  Musculoskeletal:  Negative for neck pain and neck stiffness.  Skin:  Negative for rash.  Neurological:  Negative for dizziness, weakness, numbness and headaches.  Psychiatric/Behavioral:  Negative for agitation and behavioral problems.       Objective:    Physical Exam Vitals reviewed.  Constitutional:      General: He is not in acute distress.    Appearance: He is not diaphoretic.  HENT:     Head: Normocephalic and atraumatic.     Nose: Nose normal.     Mouth/Throat:     Mouth: Mucous membranes are moist.  Eyes:     General: No scleral icterus.    Extraocular Movements: Extraocular movements intact.  Cardiovascular:     Rate and Rhythm: Normal rate and regular rhythm.     Pulses: Normal pulses.     Heart sounds: Normal heart sounds. No murmur heard. Pulmonary:     Breath sounds: Normal  breath sounds. No wheezing or rales.  Abdominal:     Palpations: Abdomen is soft.     Tenderness: There is no abdominal tenderness.  Musculoskeletal:     Cervical back: Neck supple. No tenderness.     Right lower leg: No edema.     Left lower leg: No edema.  Skin:    General: Skin is warm.     Findings: No rash.     Comments: AV fistula on left UE  Neurological:     General: No focal deficit present.     Mental Status: He is alert and oriented to person, place, and time.     Cranial Nerves: No cranial nerve deficit.     Sensory: No sensory deficit.     Motor: No weakness.  Psychiatric:        Mood and Affect: Mood normal.        Behavior: Behavior normal.     BP 136/60 (BP Location: Right Arm)   Pulse 60   Ht 6' (1.829 m)   Wt 246 lb 6.4 oz (111.8 kg)   SpO2 97%   BMI 33.42 kg/m  Wt Readings from Last 3 Encounters:  06/28/23 246 lb 6.4 oz (111.8 kg)  01/18/23 249 lb 3.2 oz (113 kg)  12/28/22 245 lb 9.6 oz (111.4 kg)    Lab Results  Component Value Date   TSH 1.040 06/24/2022   Lab Results  Component Value Date   WBC 5.3 11/03/2022   HGB 10.5 (A) 05/30/2023   HCT 36.3 (L) 11/03/2022   MCV 92.4 11/03/2022   PLT 195 11/03/2022   Lab Results  Component Value Date   NA 134 (L) 11/03/2022   K 4.4 05/30/2023   CO2 27 11/03/2022   GLUCOSE 175 (H) 11/03/2022   BUN 34 (H) 11/03/2022   CREATININE  6.82 (H) 11/03/2022   BILITOT 0.4 11/03/2022   ALKPHOS 115 11/03/2022   AST 18 11/03/2022   ALT 16 11/03/2022   PROT 6.7 11/03/2022   ALBUMIN 3.3 (L) 11/03/2022   CALCIUM 8.5 (L) 11/03/2022   ANIONGAP 12 11/03/2022   EGFR 6 (L) 06/24/2022   GFR 33.04 (L) 04/24/2018   Lab Results  Component Value Date   CHOL 107 04/15/2023   Lab Results  Component Value Date   HDL 55 06/24/2022   Lab Results  Component Value Date   LDLCALC 74 06/24/2022   Lab Results  Component Value Date   TRIG 82 06/24/2022   Lab Results  Component Value Date   CHOLHDL 2.6  06/24/2022   Lab Results  Component Value Date   HGBA1C 5.7 04/15/2023      Assessment & Plan:   Problem List Items Addressed This Visit       Cardiovascular and Mediastinum   CAD (coronary artery disease) (Chronic)   S/p stent placement in 2022 On Plavix and statin Denies any anginal chest pain or dyspnea currently - has SL NTG PRN Followed by cardiology      Relevant Medications   atorvastatin (LIPITOR) 80 MG tablet   Heart failure with improved ejection fraction (HFimpEF) (HCC) (Chronic)   Has h/o CAD On Metoprolol Appears euvolemic currently Recent echocardiogram showed LVEF of 50 to 55%, new echocardiogram report faxed to [facility] cardiology for considering to have patient back on transplant list      Relevant Medications   atorvastatin (LIPITOR) 80 MG tablet     Digestive   Gastroesophageal reflux disease   Well controlled with pantoprazole      Relevant Medications   pantoprazole (PROTONIX) 40 MG tablet     Endocrine   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)   Lab Results  Component Value Date   HGBA1C 5.7 04/15/2023   Was on insulin in the past, currently diet controlled since starting HD in 12/2020 Advised to follow diabetic diet On statin F/u CMP and lipid panel Diabetic eye exam: Advised to follow up with Ophthalmology for diabetic eye exam      Relevant Medications   atorvastatin (LIPITOR) 80 MG tablet     Genitourinary   ESRD (end stage renal disease) (HCC) (Chronic)   On HD - MWF Has minimal urine output, denies urinary complaints On sevelamer Blood tests from HD center reviewed        Other   Dependence on renal dialysis (HCC)   On HD - MWF for ESRD On sevelamer      Encounter for general adult medical examination with abnormal findings - Primary   Physical exam as documented. Blood tests reviewed from HD center today. Has had Shingrix and Tdap vaccines at local pharmacy.       Meds ordered this  encounter  Medications   atorvastatin (LIPITOR) 80 MG tablet    Sig: Take 1 tablet (80 mg total) by mouth daily.    Dispense:  90 tablet    Refill:  3   pantoprazole (PROTONIX) 40 MG tablet    Sig: Take 1 tablet (40 mg total) by mouth daily.    Dispense:  90 tablet    Refill:  3    Follow-up: Return in about 6 months (around 12/29/2023) for DM and HTN.    Anabel Halon, MD

## 2023-06-28 NOTE — Patient Instructions (Signed)
 Please contact Cardiologist at [facility] at [phone] for follow up of Echocardiogram.  Please continue to take medications as prescribed.  Please continue to follow low carb diet and perform moderate exercise/walking as tolerated.  Please consider getting Tdap vaccine at local pharmacy.

## 2023-07-01 NOTE — Assessment & Plan Note (Signed)
Well-controlled with pantoprazole ?

## 2023-07-01 NOTE — Assessment & Plan Note (Signed)
 Has h/o CAD On Metoprolol Appears euvolemic currently Recent echocardiogram showed LVEF of 50 to 55%, new echocardiogram report faxed to [facility] cardiology for considering to have patient back on transplant list

## 2023-07-01 NOTE — Assessment & Plan Note (Signed)
S/p stent placement in 2022 On Plavix and statin Denies any anginal chest pain or dyspnea currently - has SL NTG PRN Followed by cardiology

## 2023-07-01 NOTE — Assessment & Plan Note (Signed)
 Physical exam as documented. Blood tests reviewed from HD center today. Has had Shingrix and Tdap vaccines at local pharmacy.

## 2023-07-01 NOTE — Assessment & Plan Note (Signed)
 Lab Results  Component Value Date   HGBA1C 5.7 04/15/2023   Was on insulin in the past, currently diet controlled since starting HD in 12/2020 Advised to follow diabetic diet On statin F/u CMP and lipid panel Diabetic eye exam: Advised to follow up with Ophthalmology for diabetic eye exam

## 2023-07-01 NOTE — Assessment & Plan Note (Signed)
 On HD - MWF Has minimal urine output, denies urinary complaints On sevelamer Blood tests from HD center reviewed

## 2023-07-01 NOTE — Assessment & Plan Note (Signed)
On HD - MWF for ESRD On sevelamer

## 2023-07-07 ENCOUNTER — Ambulatory Visit: Payer: PPO | Attending: Internal Medicine | Admitting: Internal Medicine

## 2023-07-07 ENCOUNTER — Encounter: Payer: Self-pay | Admitting: Internal Medicine

## 2023-07-07 VITALS — BP 136/70 | HR 62 | Ht 72.0 in | Wt 248.0 lb

## 2023-07-07 DIAGNOSIS — I2510 Atherosclerotic heart disease of native coronary artery without angina pectoris: Secondary | ICD-10-CM | POA: Diagnosis not present

## 2023-07-07 DIAGNOSIS — I5022 Chronic systolic (congestive) heart failure: Secondary | ICD-10-CM

## 2023-07-07 DIAGNOSIS — I493 Ventricular premature depolarization: Secondary | ICD-10-CM

## 2023-07-07 NOTE — Patient Instructions (Signed)

## 2023-07-07 NOTE — Progress Notes (Signed)
 Cardiology Office Note  Date: 07/07/2023   ID: [name] [name], [name] [name] [name] [name], MRN [id number]  PCP:  Anabel Halon, MD  Cardiologist:  Marjo Bicker, MD Electrophysiologist:  None   History of Present Illness: [name] [name] is a 73 y.o. 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  Patient follows with kidney transplant team at [facility] in [place].  NM stress test was ordered by the team that showed no ischemia but LVEF was 36%.  Subsequent echocardiogram showed LVEF 50 to 55% and hence LHC was deferred.  He was seen by cardiology at [facility] who recommended limited echocardiogram to be performed at that institution for further recommendations.  He has an echocardiogram scheduled in April 2025.  He has occasional chest pains at night but no chest pain with exertion.  He reported feeling tired after he does yard work.  Otherwise no other symptoms of dizziness, presyncope, syncope, palpitations, leg swelling or DOE.  Compliant with medications and has no side effects.  He lost around more than 100 pounds after following healthy diet.  Past Medical History:  Diagnosis Date   Asthma    CAD (coronary artery disease)    Chronic edema    Chronic HFrEF (heart failure with reduced ejection fraction) (HCC)    Depression    Diabetes mellitus (HCC)    ESRD on hemodialysis (HCC)    MGUS (monoclonal gammopathy of unknown significance)    Obesity    prior weight loss    Past Surgical History:  Procedure Laterality Date   ABCESS DRAINAGE     on back - possible spider bite   AV FISTULA PLACEMENT Left 12/23/2020   Procedure: LEFT ARM ARTERIOVENOUS (AV) FISTULA CREATION;  Surgeon: Larina Earthly, MD;  Location: [facility];  Service: Vascular;  Laterality: Left;   BASCILIC VEIN TRANSPOSITION Left  02/10/2021   Procedure: LEFT ARM SECOND STAGE BASILIC VEIN TRANSPOSITION;  Surgeon: Larina Earthly, MD;  Location: [facility];  Service: Vascular;  Laterality: Left;   COLONOSCOPY     CORONARY STENT INTERVENTION N/A 05/26/2021   Procedure: CORONARY STENT INTERVENTION;  Surgeon: Lennette Bihari, MD;  Location: [facility];  Service: Cardiovascular;  Laterality: N/A;   INSERTION OF DIALYSIS CATHETER Right 12/23/2020   Procedure: INSERTION OF PALINDROME DIALYSIS CATHETER;  Surgeon: Larina Earthly, MD;  Location: [facility];  Service: Vascular;  Laterality: Right;   LEFT HEART CATH AND CORONARY ANGIOGRAPHY N/A 05/26/2021   Procedure: LEFT HEART CATH AND CORONARY ANGIOGRAPHY;  Surgeon: Lennette Bihari, MD;  Location: [facility];  Service: Cardiovascular;  Laterality: N/A;   REMOVAL OF A DIALYSIS CATHETER N/A 05/05/2021   Procedure: MINOR REMOVAL OF A TUNNELED DIALYSIS CATHETER;  Surgeon: Larina Earthly, MD;  Location: [facility];  Service: Vascular;  Laterality: N/A;    Current Outpatient Medications  Medication Sig Dispense Refill   albuterol (VENTOLIN HFA) 108 (90 Base) MCG/ACT inhaler Inhale 1-2 puffs into the lungs every 6 (six) hours as needed for wheezing or shortness of breath. 8 g 2   atorvastatin (LIPITOR) 80 MG tablet Take 1 tablet (80 mg total) by mouth daily. 90 tablet 3   benzonatate (TESSALON) 100 MG capsule TAKE 1 CAPSULE BY MOUTH TWICE DAILY AS NEEDED FOR COUGH 30 capsule 0  budesonide-formoterol (BREYNA) 160-4.5 MCG/ACT inhaler Inhale 2 puffs into the lungs 2 (two) times daily.     clopidogrel (PLAVIX) 75 MG tablet Take 1 tablet by mouth once daily 30 tablet 7   metoprolol succinate (TOPROL-XL) 25 MG 24 hr tablet TAKE 1 TABLET BY MOUTH IN THE MORNING AND AT BEDTIME 180 tablet 3   midodrine (PROAMATINE) 5 MG tablet Take 5 mg by mouth every other day. Before HD     nitroGLYCERIN (NITROSTAT) 0.4 MG SL tablet Place 1 tablet (0.4 mg total) under the tongue every 5 (five) minutes as needed for  chest pain. 30 tablet 12   pantoprazole (PROTONIX) 40 MG tablet Take 1 tablet (40 mg total) by mouth daily. 90 tablet 3   sevelamer (RENAGEL) 800 MG tablet Take 800 mg by mouth 3 (three) times daily with meals.     triamcinolone cream (KENALOG) 0.1 % Apply 1 Application topically 2 (two) times daily. 30 g 0   No current facility-administered medications for this visit.   Allergies:  Patient has no known allergies.   Social History: The patient  reports that he has never smoked. He has never used smokeless tobacco. He reports that he does not currently use alcohol. He reports that he does not use drugs.   Family History: The patient's family history includes Breast cancer in his mother; Diabetes in his brother and sister; Emphysema in his father.   ROS:  Please see the history of present illness. Otherwise, complete review of systems is positive for none.  All other systems are reviewed and negative.   Physical Exam: VS:  BP 136/70   Pulse 62   Ht 6' (1.829 m)   Wt 248 lb (112.5 kg)   SpO2 96%   BMI 33.63 kg/m , BMI Body mass index is 33.63 kg/m.  Wt Readings from Last 3 Encounters:  07/07/23 248 lb (112.5 kg)  06/28/23 246 lb 6.4 oz (111.8 kg)  01/18/23 249 lb 3.2 oz (113 kg)    General: Patient appears comfortable at rest. HEENT: Conjunctiva and lids normal, oropharynx clear with moist mucosa. Neck: Supple, no elevated JVP or carotid bruits, no thyromegaly. Lungs: Clear to auscultation, nonlabored breathing at rest. Cardiac: Regular rate and rhythm, no S3 or significant systolic murmur, no pericardial rub. Abdomen: Soft, nontender, no hepatomegaly, bowel sounds present, no guarding or rebound. Extremities: No pitting edema, distal pulses 2+. Skin: Warm and dry. Musculoskeletal: No kyphosis. Neuropsychiatric: Alert and oriented x3, affect grossly appropriate.  Recent Labwork: 11/03/2022: ALT 16; AST 18; BUN 34; Creatinine, Ser 6.82; Platelets 195; Sodium 134 05/30/2023:  Hemoglobin 10.5; Potassium 4.4     Component Value Date/Time   CHOL 107 04/15/2023 0000   CHOL 145 06/24/2022 1359   TRIG 82 06/24/2022 1359   HDL 55 06/24/2022 1359   CHOLHDL 2.6 06/24/2022 1359   CHOLHDL 2.5 08/14/2021 1215   VLDL 17 08/14/2021 1215   LDLCALC 74 06/24/2022 1359     Assessment and Plan:  CAD s/p LAD PCI in 05/2021: He has noncardiac chest pains but no chest pain with exertion.  NM stress test in 2024 showed no ischemia but LVEF was 36%, subsequent echocardiogram showed LVEF 50 to 55%.  He was later seen by cardiology at [facility] in November 2024 who recommended limited echocardiogram to be performed at their institution, pending in April per patient.  He lost more than 100 pounds after following a strict healthy diet.  Congratulated.  He refused Mounjaro in the past, discussed about  initiating Ozempic for cardiovascular benefit, not keen on taking it.  Continue cardioprotective medications, Plavix 75 mg once daily, atorvastatin 80 mg nightly.  HLD, at goal: Continue atorvastatin 80 mg nightly, goal LDL less than 70.  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  Currently on midodrine for blood pressure issues.  ESRD DD: On midodrine 5 mg 3 times a week, follows with nephrology.  Type 2 diabetes mellitus: Not on medications after he lost a lot of weight.   Medication Adjustments/Labs and Tests Ordered: Current medicines are reviewed at length with the patient today.  Concerns regarding medicines are outlined above.    Disposition:  Follow up 1 year  Signed, Neilani Duffee Verne Spurr, MD, 07/07/2023 11:10 AM    [facility] at [facility] [address], [place], [place] [address]

## 2023-07-13 IMAGING — DX DG BONE SURVEY MET
8 of 10 series · 8 of 10 positions shown · non-contrast
Comparison: None.

CLINICAL DATA: 70-year-old male with MGUS

EXAM:
METASTATIC BONE SURVEY

[skull lat]
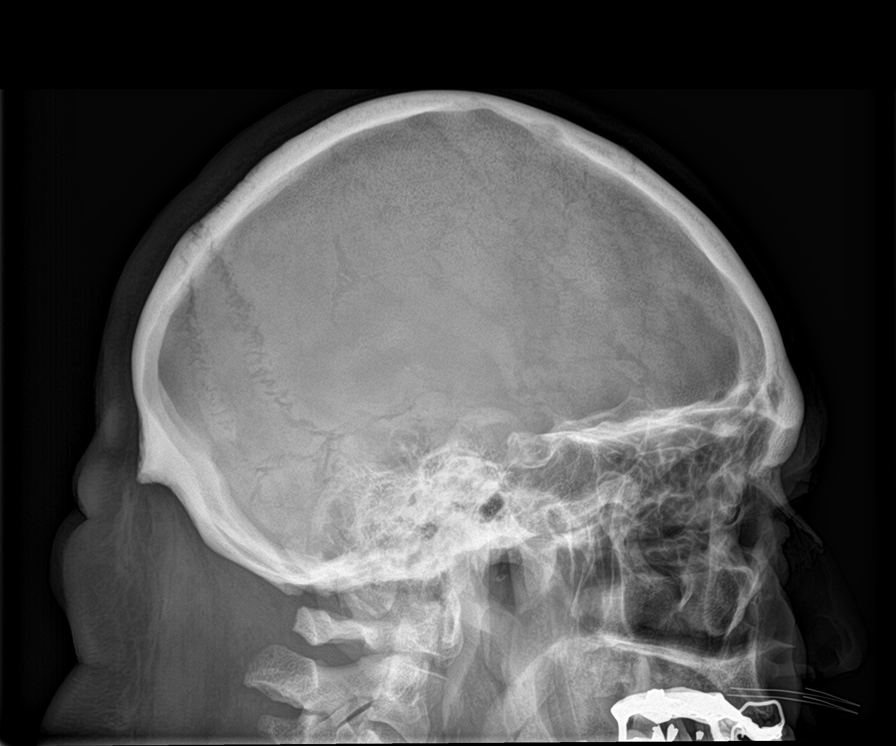

[shoulder ap (1 of 2)]
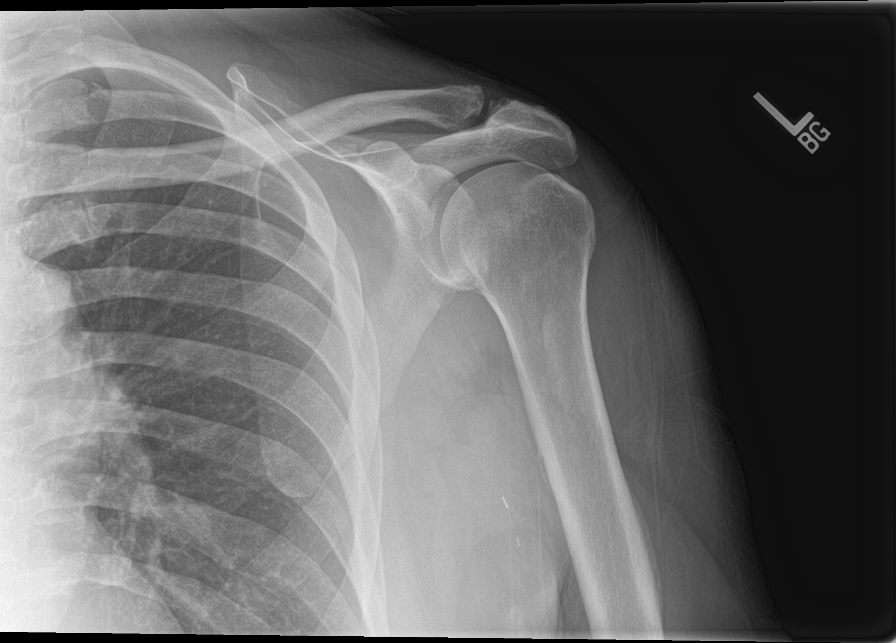

[shoulder ap (2 of 2)]
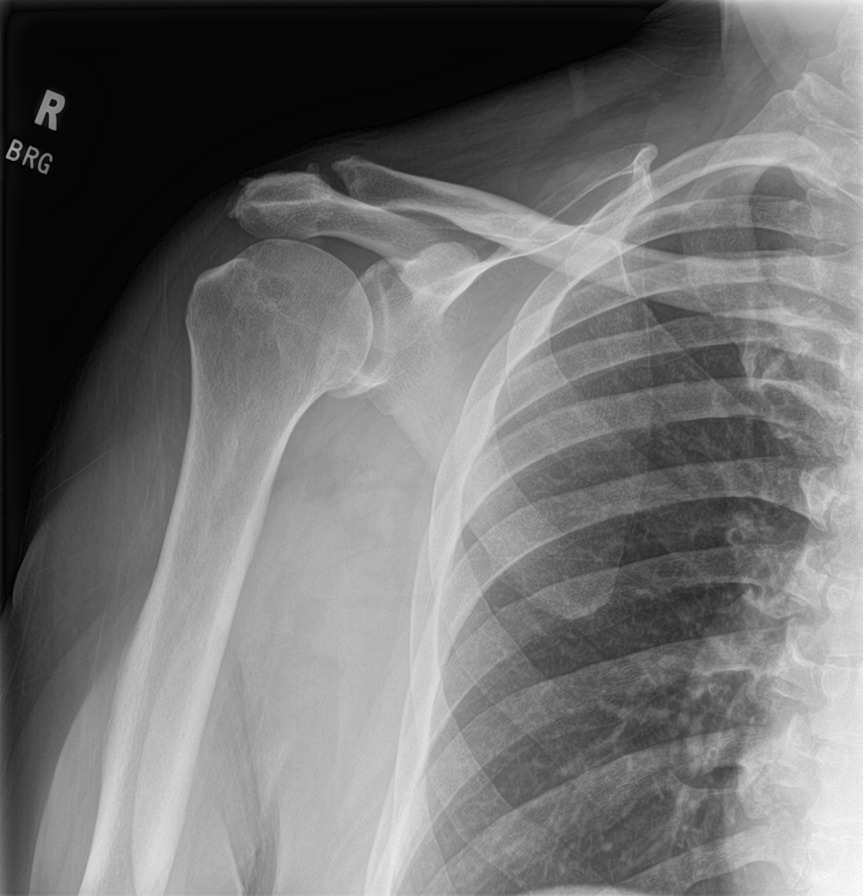

[humerus ap (1 of 2)]
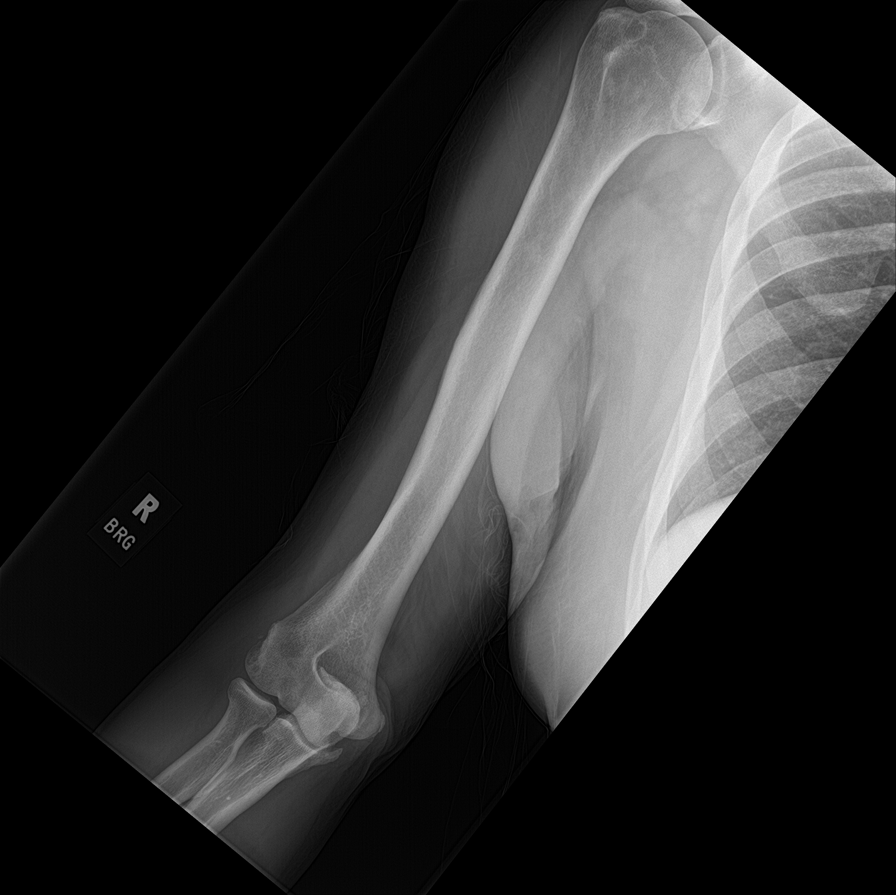

[humerus ap (2 of 2)]
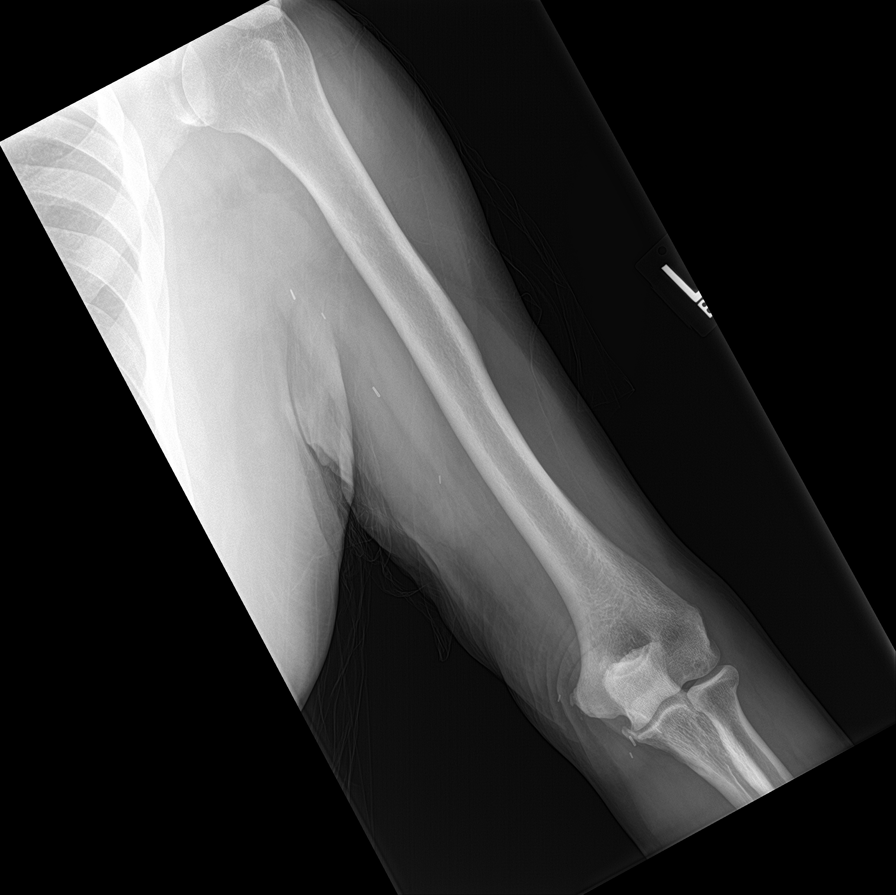

[forearm ap (1 of 2)]
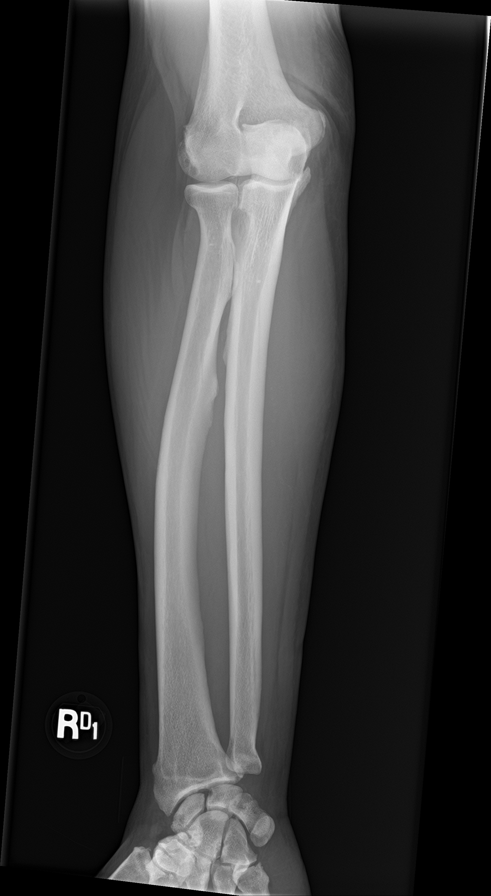

[forearm ap (2 of 2)]
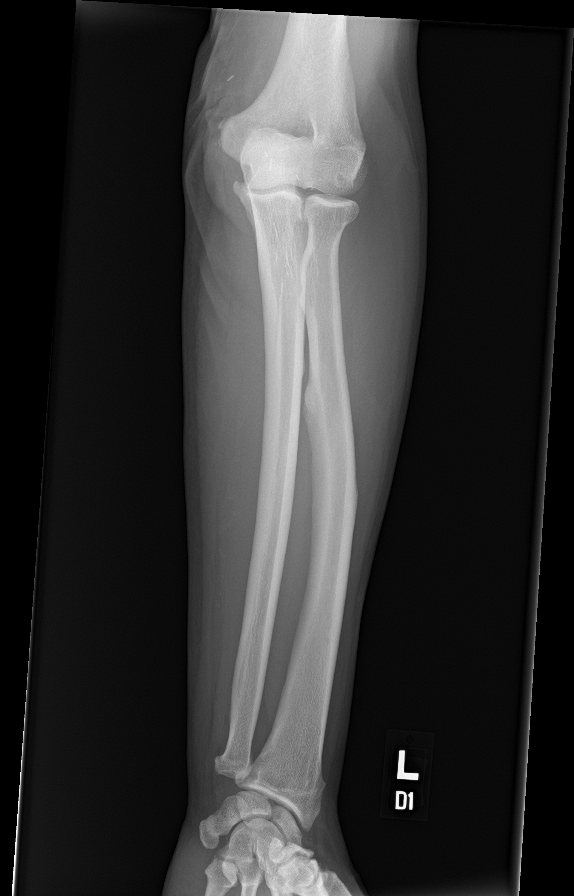

[c-spine ap]
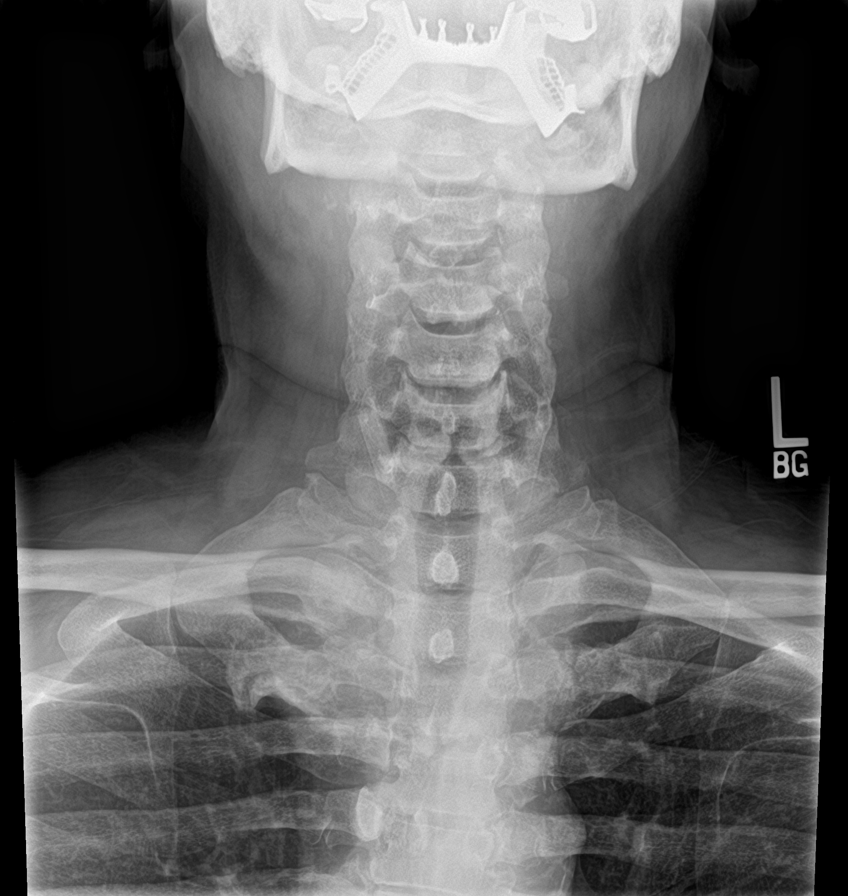

[8 of 10 positions shown; findings below may reference images not displayed]

FINDINGS: No suspicious focal bony lesions are identified.

Moderate degenerative changes in the LOWER lumbar spine are present.

Mild degenerative changes in the hips are noted.

Bilateral knee chondrocalcinosis identified.

No other significant abnormalities are noted.
IMPRESSION: 1. No suspicious focal bony lesions.

## 2023-07-17 IMAGING — DX DG CHEST 2V
2 series · 2 of 2 positions shown · non-contrast
Comparison: 12/23/2020

CLINICAL DATA: Right chest pain after dialysis today. The patient
has been having right chest pain for the past 3 days.

EXAM:
CHEST - 2 VIEW

[chest pa]
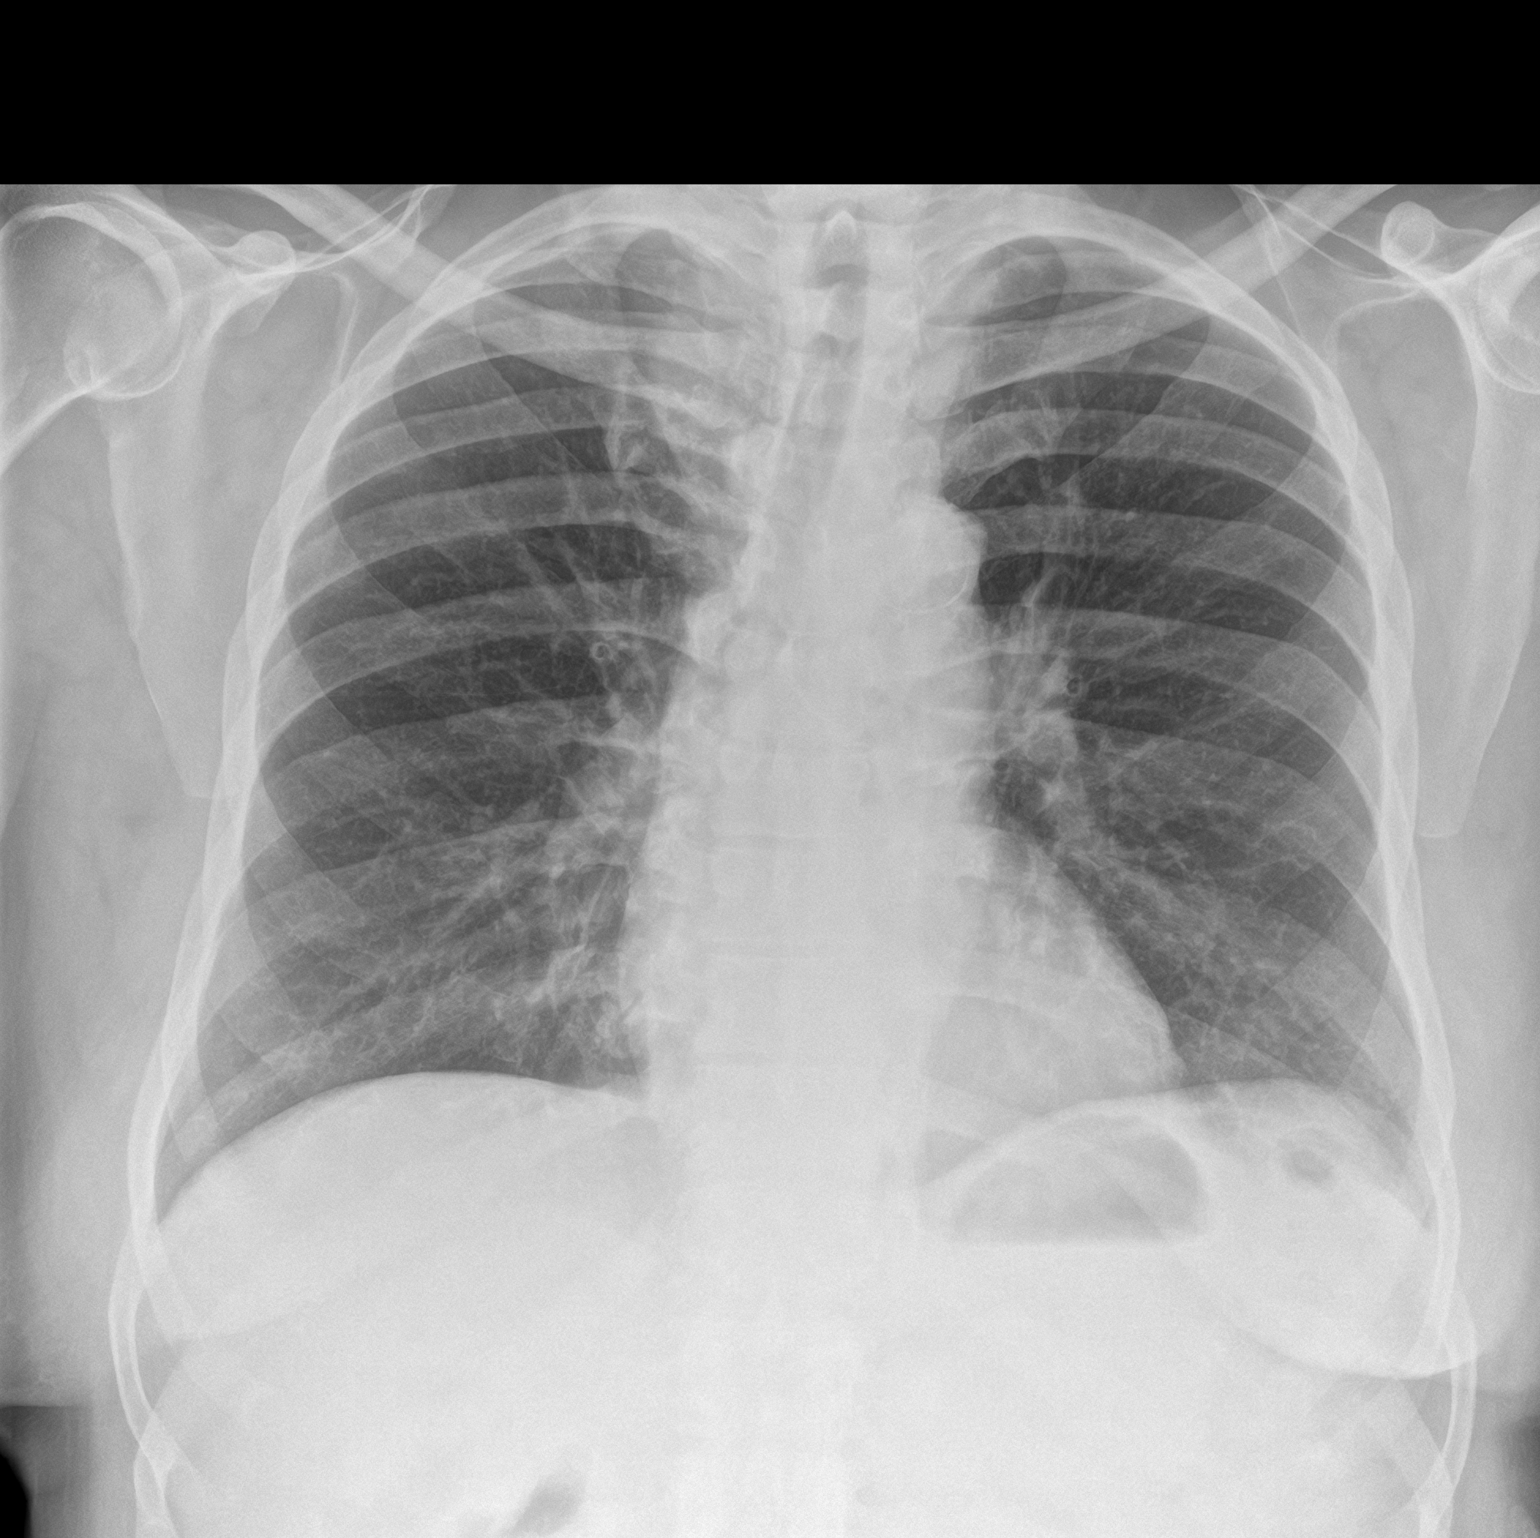

[chest lat]
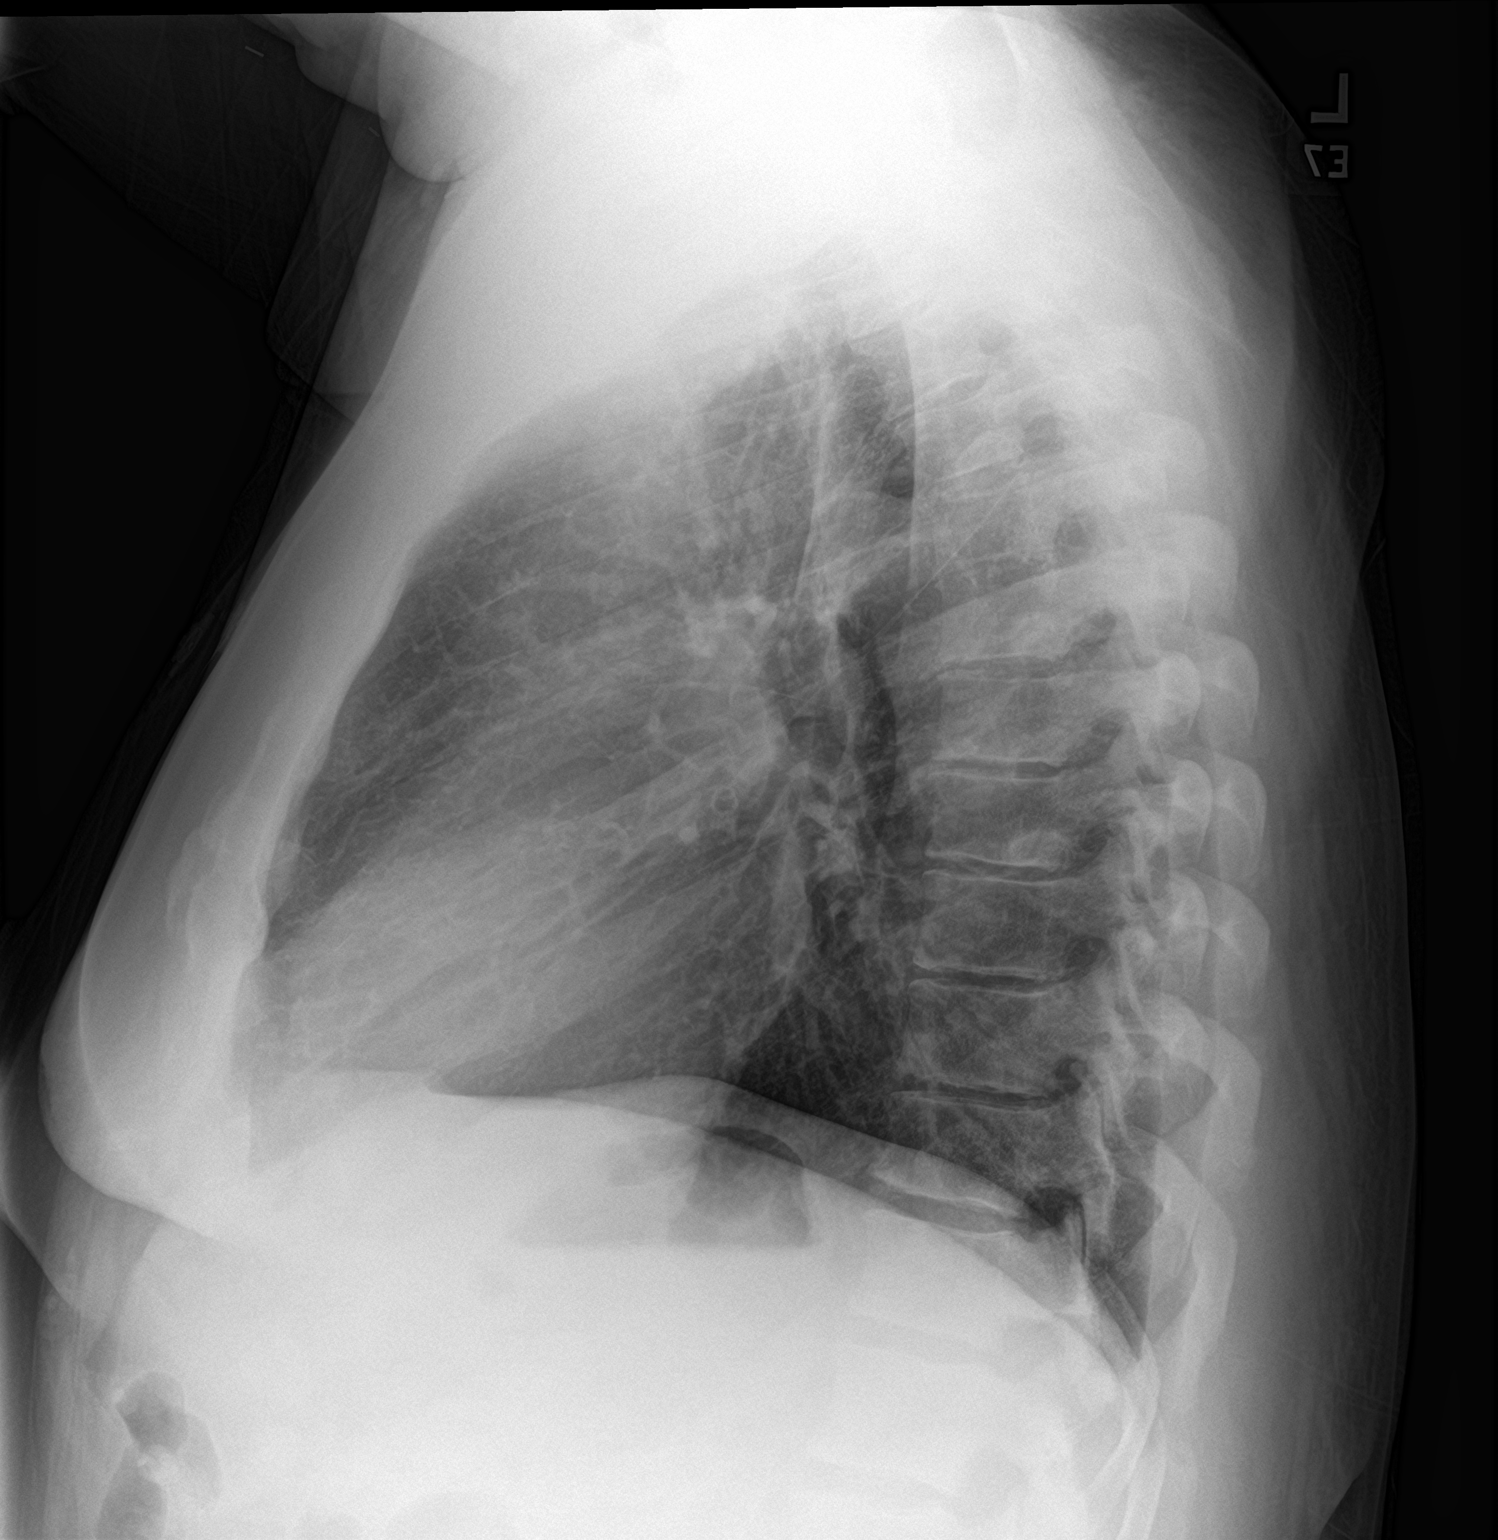

[2 of 2 positions shown; findings below may reference images not displayed]

FINDINGS: Normal sized heart. Mildly tortuous and calcified thoracic aorta.
Clear lungs. Mild peribronchial thickening with improvement. No
acute bony abnormality.
IMPRESSION: Mild bronchitic changes with improvement.

## 2023-07-18 IMAGING — US US EXTREM LOW VENOUS*L*
1 series · 14 of 24 positions shown · non-contrast
Comparison: None.

CLINICAL DATA: Chronic left lower extremity edema

EXAM:
LEFT LOWER EXTREMITY VENOUS DOPPLER ULTRASOUND
TECHNIQUE: Gray-scale sonography with compression, as well as color and duplex
ultrasound, were performed to evaluate the deep venous system(s)
from the level of the common femoral vein through the popliteal and
proximal calf veins.

[Series 1: us venous img lower uni left (dvt) · portal-venous · 14 of 42 slices shown]
[im 1/42]
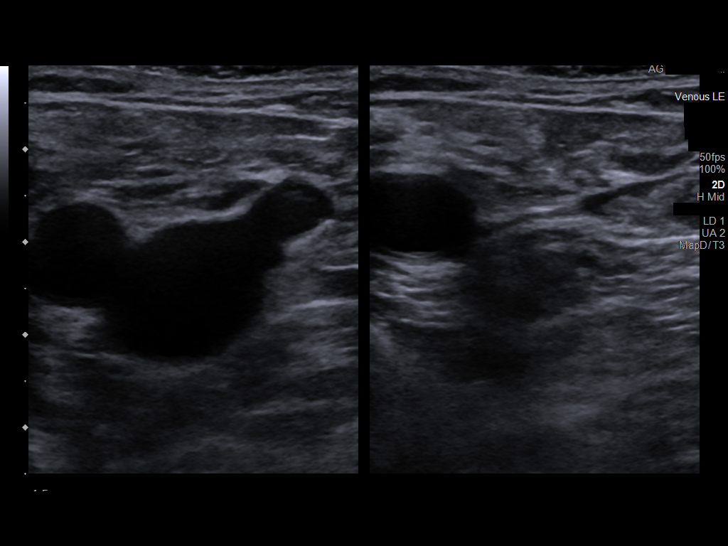
[im 4/42]
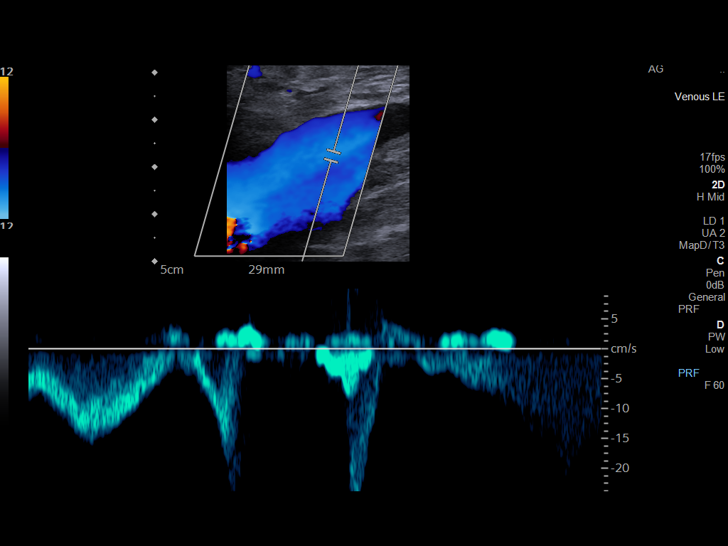
[im 8/42]
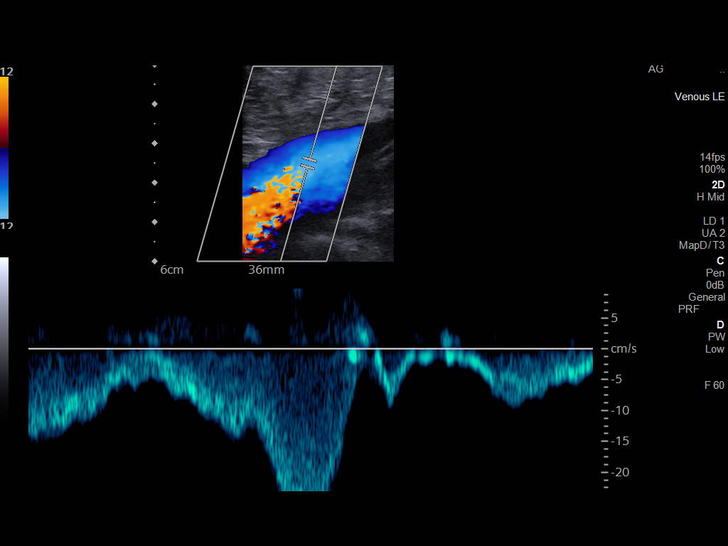
[im 11/42]
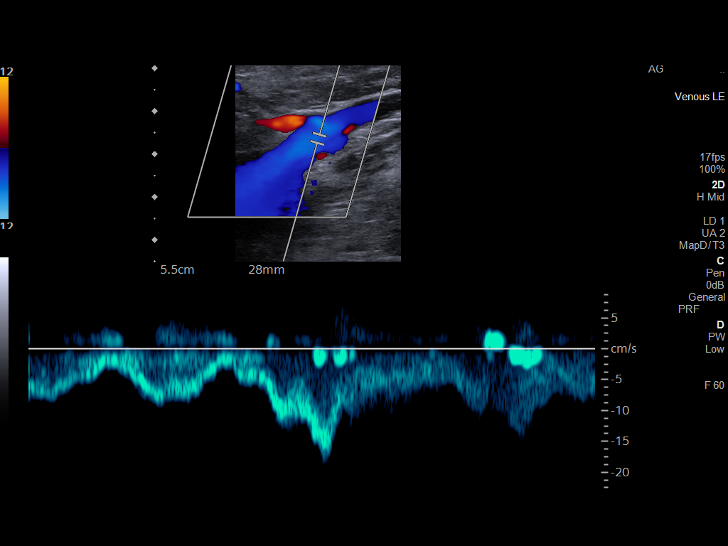
[im 13/42]
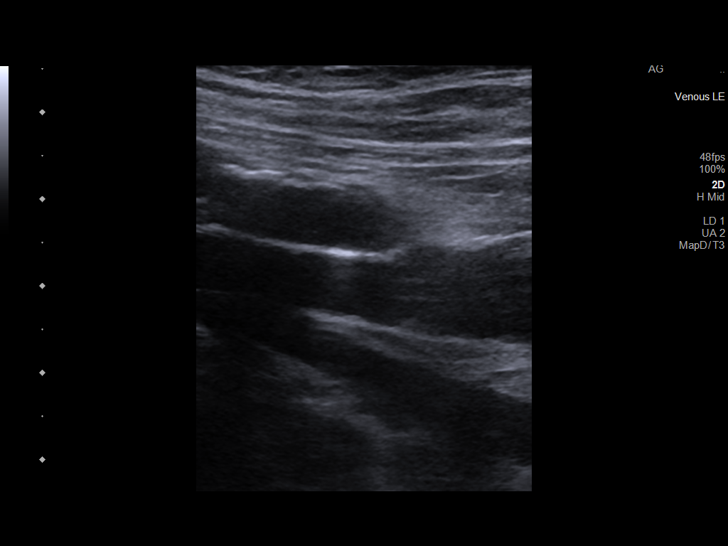
[im 17/42]
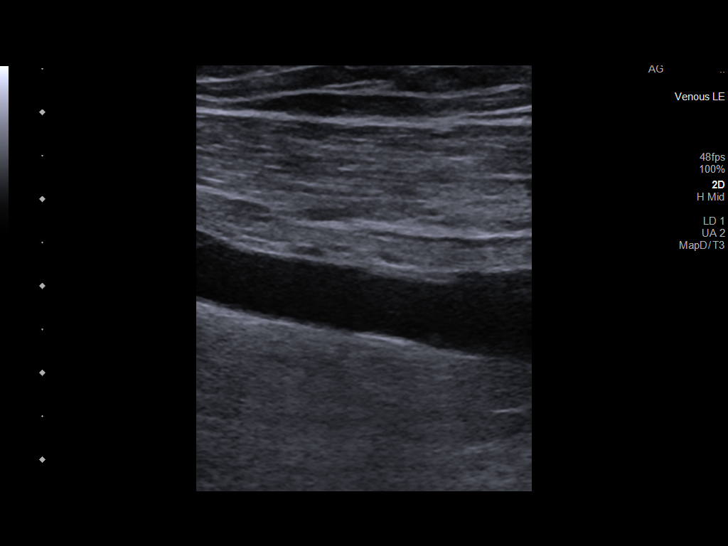
[im 20/42]
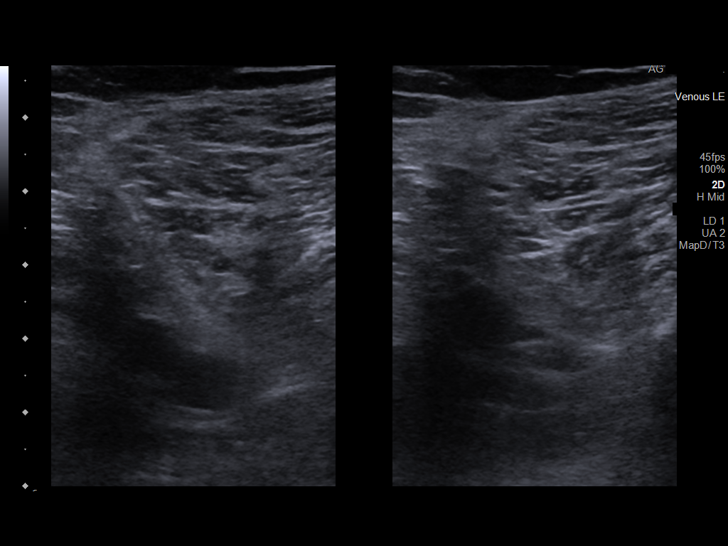
[im 22/42]
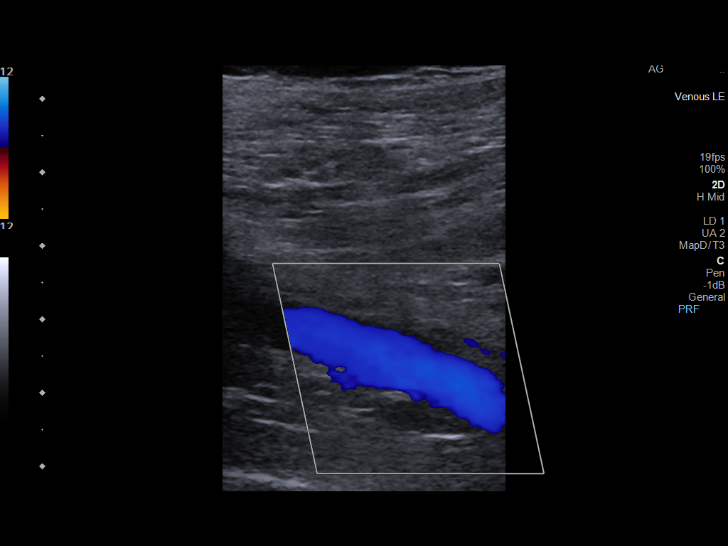
[im 25/42]
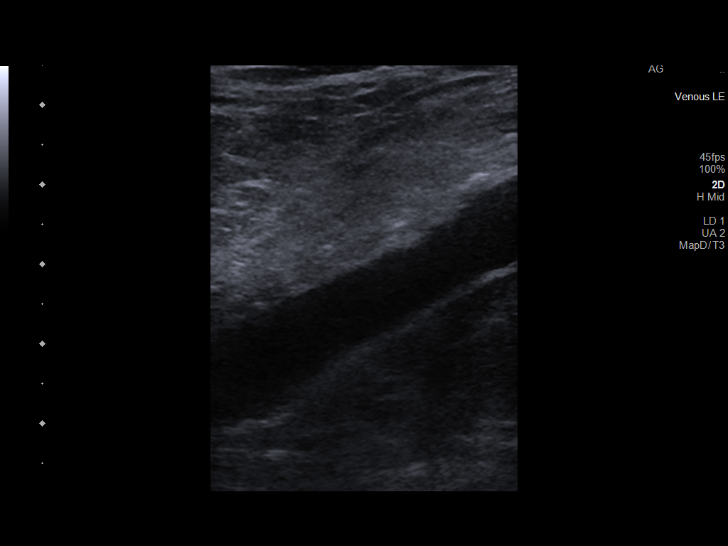
[im 29/42]
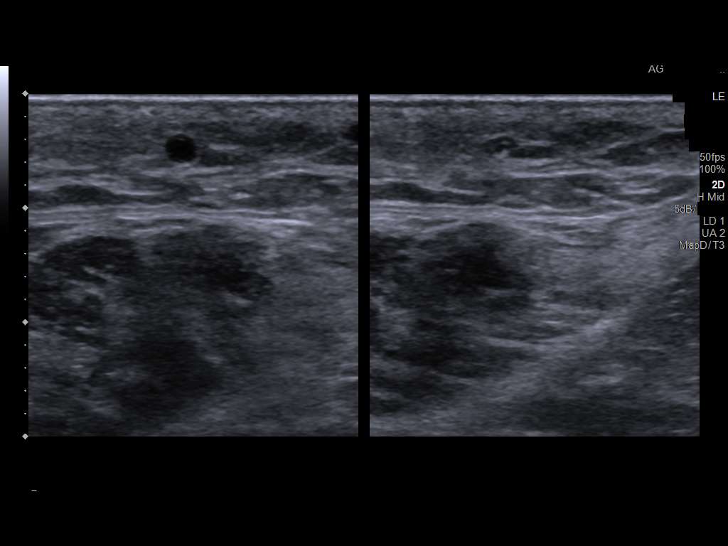
[im 33/42]
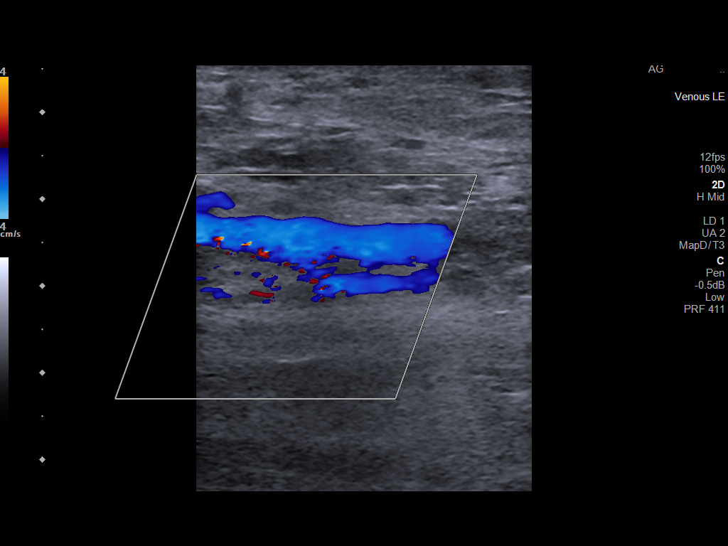
[im 34/42]
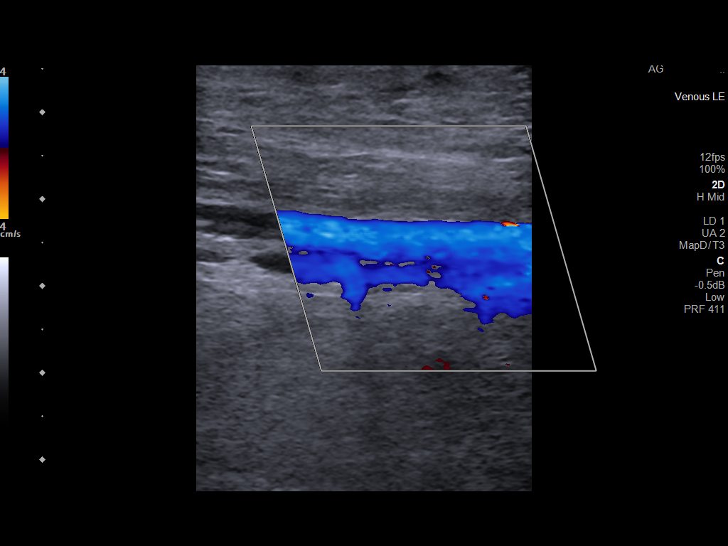
[im 38/42]
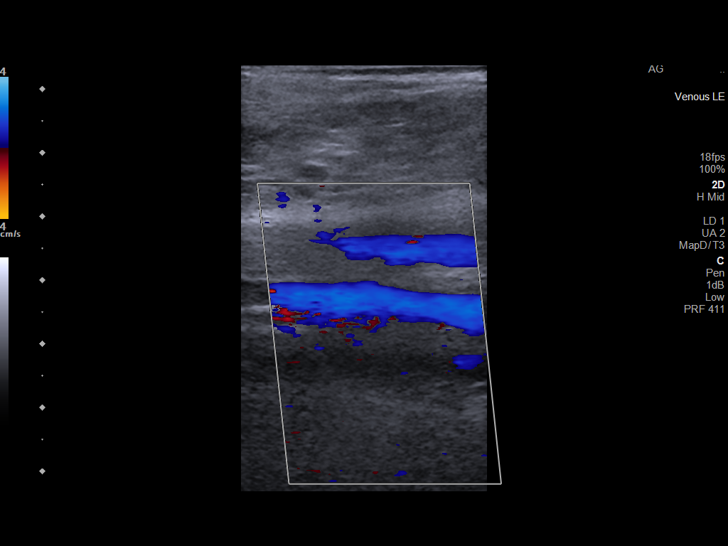
[im 42/42]
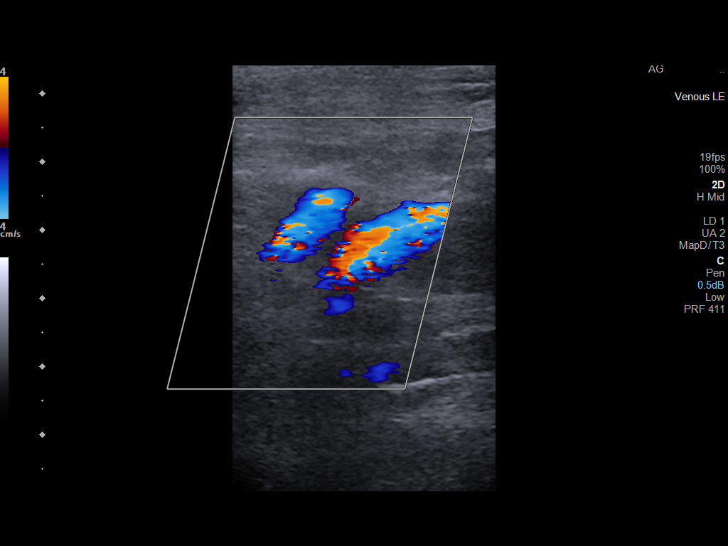

[14 of 24 positions shown; findings below may reference images not displayed]

FINDINGS: VENOUS

Normal compressibility of the common femoral, superficial femoral,
and popliteal veins, as well as the visualized calf veins.
Visualized portions of profunda femoral vein and great saphenous
vein unremarkable. No filling defects to suggest DVT on grayscale or
color Doppler imaging. Doppler waveforms show normal direction of
venous flow, normal respiratory plasticity and response to
augmentation.

Limited views of the contralateral common femoral vein are
unremarkable.

OTHER

None.

Limitations: none
IMPRESSION: No left lower extremity DVT.

## 2023-07-19 DIAGNOSIS — N186 End stage renal disease: Secondary | ICD-10-CM | POA: Diagnosis not present

## 2023-07-19 DIAGNOSIS — Z992 Dependence on renal dialysis: Secondary | ICD-10-CM | POA: Diagnosis not present

## 2023-07-20 DIAGNOSIS — N2581 Secondary hyperparathyroidism of renal origin: Secondary | ICD-10-CM | POA: Diagnosis not present

## 2023-07-20 DIAGNOSIS — Z992 Dependence on renal dialysis: Secondary | ICD-10-CM | POA: Diagnosis not present

## 2023-07-20 DIAGNOSIS — D509 Iron deficiency anemia, unspecified: Secondary | ICD-10-CM | POA: Diagnosis not present

## 2023-07-20 DIAGNOSIS — D631 Anemia in chronic kidney disease: Secondary | ICD-10-CM | POA: Diagnosis not present

## 2023-07-20 DIAGNOSIS — N186 End stage renal disease: Secondary | ICD-10-CM | POA: Diagnosis not present

## 2023-07-26 ENCOUNTER — Ambulatory Visit (INDEPENDENT_AMBULATORY_CARE_PROVIDER_SITE_OTHER): Payer: PPO

## 2023-07-26 VITALS — Ht 72.0 in | Wt 245.0 lb

## 2023-07-26 DIAGNOSIS — Z0000 Encounter for general adult medical examination without abnormal findings: Secondary | ICD-10-CM

## 2023-07-26 DIAGNOSIS — I259 Chronic ischemic heart disease, unspecified: Secondary | ICD-10-CM | POA: Diagnosis not present

## 2023-07-26 DIAGNOSIS — I459 Conduction disorder, unspecified: Secondary | ICD-10-CM | POA: Diagnosis not present

## 2023-07-26 NOTE — Progress Notes (Signed)
 Because this visit was a virtual/telehealth visit,  certain criteria was not obtained, such a blood pressure, CBG if applicable, and timed get up and go. Any medications not marked as "taking" were not mentioned during the medication reconciliation part of the visit. Any vitals not documented were not able to be obtained due to this being a telehealth visit or patient was unable to self-report a recent blood pressure reading due to a lack of equipment at home via telehealth. Vitals that have been documented are verbally provided by the patient.   Subjective:   [name] [name] is a 73 y.o. who presents for a Medicare Wellness preventive visit.  Visit Complete: Virtual I connected with  [name] [name] on 07/26/23 by a audio enabled telemedicine application and verified that I am speaking with the correct person using two identifiers.  Patient Location: Home  Provider Location: Home Office  I discussed the limitations of evaluation and management by telemedicine. The patient expressed understanding and agreed to proceed.  Vital Signs: Because this visit was a virtual/telehealth visit, some criteria may be missing or patient reported. Any vitals not documented were not able to be obtained and vitals that have been documented are patient reported.  VideoDeclined- This patient declined Librarian, academic. Therefore the visit was completed with audio only.  Persons Participating in Visit: Patient.  AWV Questionnaire: No: Patient Medicare AWV questionnaire was not completed prior to this visit.  Cardiac Risk Factors include: advanced age (>67men, >32 women);diabetes mellitus;dyslipidemia;hypertension;male gender;obesity (BMI >30kg/m2);Other (see comment);sedentary lifestyle, Risk factor comments: CHF, ESRD     Objective:    Today's Vitals   07/26/23 1509  Weight: 245 lb (111.1 kg)  Height: 6' (1.829 m)  PainSc: 0-No pain   Body mass index is 33.23 kg/m.      07/26/2023    3:16 PM 11/03/2022   12:15 PM 07/13/2022    8:02 AM 11/05/2021    9:40 PM 07/06/2021    4:36 PM 05/25/2021    2:58 PM 05/21/2021    3:04 PM  Advanced Directives  Does Patient Have a Medical Advance Directive? No No No Yes No No No  Type of Theme park manager;Living will     Does patient want to make changes to medical advance directive?    No - Patient declined     Copy of Healthcare Power of Attorney in Chart?    No - copy requested     Would patient like information on creating a medical advance directive? No - Patient declined  Yes (ED - Information included in AVS)  Yes (ED - Information included in AVS) No - Patient declined No - Patient declined    Current Medications (verified) Outpatient Encounter Medications as of 07/26/2023  Medication Sig   albuterol (VENTOLIN HFA) 108 (90 Base) MCG/ACT inhaler Inhale 1-2 puffs into the lungs every 6 (six) hours as needed for wheezing or shortness of breath.   atorvastatin (LIPITOR) 80 MG tablet Take 1 tablet (80 mg total) by mouth daily.   benzonatate (TESSALON) 100 MG capsule TAKE 1 CAPSULE BY MOUTH TWICE DAILY AS NEEDED FOR COUGH   budesonide-formoterol (BREYNA) 160-4.5 MCG/ACT inhaler Inhale 2 puffs into the lungs 2 (two) times daily.   clopidogrel (PLAVIX) 75 MG tablet Take 1 tablet by mouth once daily   metoprolol succinate (TOPROL-XL) 25 MG 24 hr tablet TAKE 1 TABLET BY MOUTH IN THE MORNING AND AT BEDTIME   midodrine (PROAMATINE) 5 MG tablet  Take 5 mg by mouth every other day. Before HD   nitroGLYCERIN (NITROSTAT) 0.4 MG SL tablet Place 1 tablet (0.4 mg total) under the tongue every 5 (five) minutes as needed for chest pain.   pantoprazole (PROTONIX) 40 MG tablet Take 1 tablet (40 mg total) by mouth daily.   sevelamer (RENAGEL) 800 MG tablet Take 800 mg by mouth 3 (three) times daily with meals.   triamcinolone cream (KENALOG) 0.1 % Apply 1 Application topically 2 (two) times daily.   No  facility-administered encounter medications on file as of 07/26/2023.    Allergies (verified) Patient has no known allergies.   History: Past Medical History:  Diagnosis Date   Asthma    CAD (coronary artery disease)    Chronic edema    Chronic HFrEF (heart failure with reduced ejection fraction) (HCC)    Depression    Diabetes mellitus (HCC)    ESRD on hemodialysis (HCC)    MGUS (monoclonal gammopathy of unknown significance)    Obesity    prior weight loss   Past Surgical History:  Procedure Laterality Date   ABCESS DRAINAGE     on back - possible spider bite   AV FISTULA PLACEMENT Left 12/23/2020   Procedure: LEFT ARM ARTERIOVENOUS (AV) FISTULA CREATION;  Surgeon: Larina Earthly, MD;  Location: [facility];  Service: Vascular;  Laterality: Left;   BASCILIC VEIN TRANSPOSITION Left 02/10/2021   Procedure: LEFT ARM SECOND STAGE BASILIC VEIN TRANSPOSITION;  Surgeon: Larina Earthly, MD;  Location: [facility];  Service: Vascular;  Laterality: Left;   COLONOSCOPY     CORONARY STENT INTERVENTION N/A 05/26/2021   Procedure: CORONARY STENT INTERVENTION;  Surgeon: Lennette Bihari, MD;  Location: [facility];  Service: Cardiovascular;  Laterality: N/A;   INSERTION OF DIALYSIS CATHETER Right 12/23/2020   Procedure: INSERTION OF PALINDROME DIALYSIS CATHETER;  Surgeon: Larina Earthly, MD;  Location: [facility];  Service: Vascular;  Laterality: Right;   LEFT HEART CATH AND CORONARY ANGIOGRAPHY N/A 05/26/2021   Procedure: LEFT HEART CATH AND CORONARY ANGIOGRAPHY;  Surgeon: Lennette Bihari, MD;  Location: [facility];  Service: Cardiovascular;  Laterality: N/A;   REMOVAL OF A DIALYSIS CATHETER N/A 05/05/2021   Procedure: MINOR REMOVAL OF A TUNNELED DIALYSIS CATHETER;  Surgeon: Larina Earthly, MD;  Location: [facility];  Service: Vascular;  Laterality: N/A;   Family History  Problem Relation Age of Onset   Breast cancer Mother        died 9   Emphysema Father        died 89   Diabetes Sister     Diabetes Brother    Social History   Socioeconomic History   Marital status: Married    Spouse name: Not on file   Number of children: Not on file   Years of education: Not on file   Highest education level: Not on file  Occupational History   Not on file  Tobacco Use   Smoking status: Never   Smokeless tobacco: Never  Vaping Use   Vaping status: Never Used  Substance and Sexual Activity   Alcohol use: Not Currently   Drug use: Never   Sexual activity: Not Currently  Other Topics Concern   Not on file  Social History Narrative   Not on file   Social Drivers of Health   Financial Resource Strain: Low Risk  (07/26/2023)   Overall Financial Resource Strain (CARDIA)    Difficulty of Paying Living Expenses:  Not hard at all  Food Insecurity: No Food Insecurity (07/26/2023)   Hunger Vital Sign    Worried About Running Out of Food in the Last Year: Never true    Ran Out of Food in the Last Year: Never true  Transportation Needs: No Transportation Needs (07/26/2023)   PRAPARE - Administrator, Civil Service (Medical): No    Lack of Transportation (Non-Medical): No  Physical Activity: Patient Declined (07/26/2023)   Exercise Vital Sign    Days of Exercise per Week: Patient declined    Minutes of Exercise per Session: Patient declined  Stress: No Stress Concern Present (07/26/2023)   [employer] of Occupational Health - Occupational Stress Questionnaire    Feeling of Stress : Not at all  Social Connections: Moderately Isolated (07/26/2023)   Social Connection and Isolation Panel [NHANES]    Frequency of Communication with Friends and Family: More than three times a week    Frequency of Social Gatherings with Friends and Family: More than three times a week    Attends Religious Services: Never    Database administrator or Organizations: No    Attends Engineer, structural: Never    Marital Status: Married    Tobacco Counseling Counseling given:  Yes    Clinical Intake:  Pre-visit preparation completed: Yes  Pain : No/denies pain Pain Score: 0-No pain     BMI - recorded: 33.23 Nutritional Risks: None Diabetes: Yes CBG done?: No (telehealth visit. unable to obtain cbg) Did pt. bring in CBG monitor from home?: No  Lab Results  Component Value Date   HGBA1C 5.7 04/15/2023   HGBA1C 5.7 10/11/2022   HGBA1C 5.4 04/14/2022     How often do you need to have someone help you when you read instructions, pamphlets, or other written materials from your doctor or pharmacy?: 1 - Never  Interpreter Needed?: No  Information entered by :: Maryjean Ka CMA   Activities of Daily Living     07/26/2023    3:15 PM  In your present state of health, do you have any difficulty performing the following activities:  Hearing? 0  Vision? 0  Difficulty concentrating or making decisions? 0  Walking or climbing stairs? 0  Dressing or bathing? 0  Doing errands, shopping? 0  Preparing Food and eating ? N  Using the Toilet? N  In the past six months, have you accidently leaked urine? N  Do you have problems with loss of bowel control? N  Managing your Medications? N  Managing your Finances? N  Housekeeping or managing your Housekeeping? N    Patient Care Team: Anabel Halon, MD as PCP - General (Internal Medicine) Mallipeddi, Orion Modest, MD as PCP - Cardiology (Cardiology)  Indicate any recent Medical Services you may have received from other than [facility] providers in the past year (date may be approximate).     Assessment:   This is a routine wellness examination for [name] [name].  Hearing/Vision screen Hearing Screening - Comments:: Patient denies any hearing difficulties.   Vision Screening - Comments:: Patient wears reading glasses only. Up to date with yearly exams.  Sees Dr. Dierdre Searles at [facility] in [place]   Goals Addressed             This Visit's Progress    Patient Stated       Work towards getting a kidney. Got back on  the transplant list today       Depression Screen  07/26/2023    3:17 PM 06/28/2023    1:22 PM 06/28/2023    1:16 PM 12/28/2022    1:33 PM 07/13/2022    8:05 AM 06/24/2022    1:08 PM 12/24/2021    1:54 PM  PHQ 2/9 Scores  PHQ - 2 Score 0 0 0 2 0 0 0  PHQ- 9 Score 0 0 0 4       Fall Risk     07/26/2023    3:17 PM 06/28/2023    1:22 PM 06/28/2023    1:16 PM 12/28/2022    1:33 PM 07/13/2022    8:05 AM  Fall Risk   Falls in the past year? 0 0 0 0 0  Number falls in past yr: 0 0 0 0   Injury with Fall? 0 0 0 0   Risk for fall due to : No Fall Risks No Fall Risks No Fall Risks No Fall Risks   Follow up Falls prevention discussed;Falls evaluation completed Falls evaluation completed Falls evaluation completed Falls evaluation completed     MEDICARE RISK AT HOME:  Medicare Risk at Home Any stairs in or around the home?: Yes If so, are there any without handrails?: No Home free of loose throw rugs in walkways, pet beds, electrical cords, etc?: Yes Adequate lighting in your home to reduce risk of falls?: Yes Life alert?: No Use of a cane, walker or w/c?: No Grab bars in the bathroom?: Yes Shower chair or bench in shower?: No Elevated toilet seat or a handicapped toilet?: No  TIMED UP AND GO:  Was the test performed?  No  Cognitive Function: 6CIT completed        07/26/2023    3:17 PM 07/13/2022    8:05 AM 07/06/2021    4:39 PM  6CIT Screen  What Year? 0 points 0 points 0 points  What month? 0 points 0 points 0 points  What time? 0 points 0 points 0 points  Count back from 20 0 points 0 points 0 points  Months in reverse 0 points 0 points 0 points  Repeat phrase 0 points 0 points 2 points  Total Score 0 points 0 points 2 points    Immunizations Immunization History  Administered Date(s) Administered   Fluad Quad(high Dose 65+) 12/24/2021   Fluad Trivalent(High Dose 65+) 12/28/2022   Hep B, Unspecified 11/06/2021, 12/09/2021, 04/02/2022   Hepatitis B 12/26/2020,  02/18/2021, 06/17/2021, 09/18/2021, 10/19/2021, 11/20/2021, 03/19/2022   Moderna Covid-19 Vaccine Bivalent Booster 77yrs & up 02/25/2021   Moderna Sars-Covid-2 Vaccination 05/25/2019, 06/22/2019, 02/27/2020, 06/08/2022   PNEUMOCOCCAL CONJUGATE-20 12/18/2021, 06/18/2022   Pfizer(Comirnaty)Fall Seasonal Vaccine 12 years and older 01/13/2023   Pneumococcal Polysaccharide-23 12/29/2020   Rsv, Bivalent, Protein Subunit Rsvpref,pf Verdis Frederickson) 01/06/2023   Tdap 01/20/2023   Zoster Recombinant(Shingrix) 12/31/2021, 03/25/2022    Screening Tests Health Maintenance  Topic Date Due   COVID-19 Vaccine (7 - Moderna risk 2024-25 season) 07/13/2023   INFLUENZA VACCINE  11/18/2023   HEMOGLOBIN A1C  11/27/2023   OPHTHALMOLOGY EXAM  01/20/2024   FOOT EXAM  06/27/2024   Medicare Annual Wellness (AWV)  07/25/2024   Colonoscopy  11/08/2029   DTaP/Tdap/Td (2 - Td or Tdap) 01/19/2033   Pneumonia Vaccine 64+ Years old  Completed   Hepatitis C Screening  Completed   Zoster Vaccines- Shingrix  Completed   HPV VACCINES  Aged Out    Health Maintenance  Health Maintenance Due  Topic Date Due   COVID-19 Vaccine (7 - Moderna risk  2024-25 season) 07/13/2023   Health Maintenance Items Addressed: Health Maintenance is up to date.   Additional Screening:  Vision Screening: Recommended annual ophthalmology exams for early detection of glaucoma and other disorders of the eye. Patient up to date with screenings. Sees [facility] in [place] Dental Screening: Recommended annual dental exams for proper oral hygiene  Community Resource Referral / Chronic Care Management: CRR required this visit?  No   CCM required this visit?  No     Plan:     I have personally reviewed and noted the following in the patient's chart:   Medical and social history Use of alcohol, tobacco or illicit drugs  Current medications and supplements including opioid prescriptions. Patient is not currently taking opioid  prescriptions. Functional ability and status Nutritional status Physical activity Advanced directives List of other physicians Hospitalizations, surgeries, and ER visits in previous 12 months Vitals Screenings to include cognitive, depression, and falls Referrals and appointments  In addition, I have reviewed and discussed with patient certain preventive protocols, quality metrics, and best practice recommendations. A written personalized care plan for preventive services as well as general preventive health recommendations were provided to patient.     Jordan Hawks Pheobe Sandiford, CMA   07/26/2023   After Visit Summary: (Mail) Due to this being a telephonic visit, the after visit summary with patients personalized plan was offered to patient via mail   Notes: Please refer to Routing Comments.

## 2023-07-26 NOTE — Patient Instructions (Signed)
 [name] , Thank you for taking time to come for your Medicare Wellness Visit. I appreciate your ongoing commitment to your health goals. Please review the following plan we discussed and let me know if I can assist you in the future.   Please see your treatment plan below: Referrals:No referrals were placed during todays visit Preventative Screenings:You are up to date on your screenings Labs:no labs were ordered Follow-Up:Medicare AWV July 30, 2024 at 10:00 am telephone visit.  Clinician Recommendations: Aim for 30 minutes of exercise or brisk walking, 6-8 glasses of water, and 5 servings of fruits and vegetables each day.  This is a list of the screening recommended for you and due dates:  Health Maintenance  Topic Date Due   COVID-19 Vaccine (7 - Moderna risk 2024-25 season) 07/13/2023   Flu Shot  11/18/2023   Hemoglobin A1C  11/27/2023   Eye exam for diabetics  01/20/2024   Complete foot exam   06/27/2024   Medicare Annual Wellness Visit  07/25/2024   Colon Cancer Screening  11/08/2029   DTaP/Tdap/Td vaccine (2 - Td or Tdap) 01/19/2033   Pneumonia Vaccine  Completed   Hepatitis C Screening  Completed   Zoster (Shingles) Vaccine  Completed   HPV Vaccine  Aged Out    Advanced directives: (Declined) Advance directive discussed with you today. Even though you declined this today, please call our office should you change your mind, and we can give you the proper paperwork for you to fill out.   Advance Care Planning is important because it:  [x]  Makes sure you receive the medical care that is consistent with your values, goals, and preferences  [x]  It provides guidance to your family and loved ones and it also reduces their decisional burden about whether or not they are making the right decisions based on what you want done  Follow the link provided in your after visit summary or read over the paperwork we have mailed to you to help you started getting your Advance Directives  in place. If you need assistance in completing these, please reach out to [place] so that we can help you!   Next Medicare Annual Wellness Visit scheduled for next year: yes  Understanding Your Risk for Falls Millions of people have serious injuries from falls each year. It is important to understand your risk of falling. Talk with your health care provider about your risk and what you can do to lower it. If you do have a serious fall, make sure to tell your provider. Falling once raises your risk of falling again. How can falls affect me? Serious injuries from falls are common. These include: Broken bones, such as hip fractures. Head injuries, such as traumatic brain injuries (TBI) or concussions. A fear of falling can cause you to avoid activities and stay at home. This can make your muscles weaker and raise your risk for a fall. What can increase my risk? There are a number of risk factors that increase your risk for falling. The more risk factors you have, the higher your risk of falling. Serious injuries from a fall happen most often to people who are older than 74 years old. Teenagers and young adults ages 74-29 are also at higher risk. Common risk factors include: Weakness in the lower body. Being generally weak or confused due to long-term (chronic) illness. Dizziness or balance problems. Poor vision. Medicines that cause dizziness or drowsiness. These may include: Medicines for your blood pressure, heart, anxiety, insomnia, or  swelling (edema). Pain medicines. Muscle relaxants. Other risk factors include: Drinking alcohol. Having had a fall in the past. Having foot pain or wearing improper footwear. Working at a dangerous job. Having any of the following in your home: Tripping hazards, such as floor clutter or loose rugs. Poor lighting. Pets. Having dementia or memory loss. What actions can I take to lower my risk of falling?     Physical activity Stay physically fit. Do  strength and balance exercises. Consider taking a regular class to build strength and balance. Yoga and tai chi are good options. Vision Have your eyes checked every year and your prescription for glasses or contacts updated as needed. Shoes and walking aids Wear non-skid shoes. Wear shoes that have rubber soles and low heels. Do not wear high heels. Do not walk around the house in socks or slippers. Use a cane or walker as told by your provider. Home safety Attach secure railings on both sides of your stairs. Install grab bars for your bathtub, shower, and toilet. Use a non-skid mat in your bathtub or shower. Attach bath mats securely with double-sided, non-slip rug tape. Use good lighting in all rooms. Keep a flashlight near your bed. Make sure there is a clear path from your bed to the bathroom. Use night-lights. Do not use throw rugs. Make sure all carpeting is taped or tacked down securely. Remove all clutter from walkways and stairways, including extension cords. Repair uneven or broken steps and floors. Avoid walking on icy or slippery surfaces. Walk on the grass instead of on icy or slick sidewalks. Use ice melter to get rid of ice on walkways in the winter. Use a cordless phone. Questions to ask your health care provider Can you help me check my risk for a fall? Do any of my medicines make me more likely to fall? Should I take a vitamin D supplement? What exercises can I do to improve my strength and balance? Should I make an appointment to have my vision checked? Do I need a bone density test to check for weak bones (osteoporosis)? Would it help to use a cane or a walker? Where to find more information Centers for Disease Control and Prevention, STEADI: TonerPromos.no Community-Based Fall Prevention Programs: TonerPromos.no [employer] on Aging: BaseRingTones.pl Contact a health care provider if: You fall at home. You are afraid of falling at home. You feel weak, drowsy, or  dizzy. This information is not intended to replace advice given to you by your health care provider. Make sure you discuss any questions you have with your health care provider. Document Revised: 12/07/2021 Document Reviewed: 12/07/2021 Elsevier Patient Education  2024 [employer].

## 2023-07-28 DIAGNOSIS — J4530 Mild persistent asthma, uncomplicated: Secondary | ICD-10-CM | POA: Diagnosis not present

## 2023-08-18 DIAGNOSIS — Z992 Dependence on renal dialysis: Secondary | ICD-10-CM | POA: Diagnosis not present

## 2023-08-18 DIAGNOSIS — N186 End stage renal disease: Secondary | ICD-10-CM | POA: Diagnosis not present

## 2023-08-19 DIAGNOSIS — D509 Iron deficiency anemia, unspecified: Secondary | ICD-10-CM | POA: Diagnosis not present

## 2023-08-19 DIAGNOSIS — N186 End stage renal disease: Secondary | ICD-10-CM | POA: Diagnosis not present

## 2023-08-19 DIAGNOSIS — D631 Anemia in chronic kidney disease: Secondary | ICD-10-CM | POA: Diagnosis not present

## 2023-08-19 DIAGNOSIS — N2581 Secondary hyperparathyroidism of renal origin: Secondary | ICD-10-CM | POA: Diagnosis not present

## 2023-08-19 DIAGNOSIS — Z992 Dependence on renal dialysis: Secondary | ICD-10-CM | POA: Diagnosis not present

## 2023-09-15 ENCOUNTER — Telehealth: Payer: Self-pay | Admitting: Internal Medicine

## 2023-09-15 NOTE — Telephone Encounter (Signed)
 Per chart review, atorvastatin  was prescribed 06/28/2023 with 3 refills. RN called the pt. Pt states his bottle says 0 refills and the pharmacy told him he had no refills.  RN called [facility]. Pharmacist said the medication is there and ready to be picked up. RN called pt back and advised him it's ready. Pt verbalized understanding.

## 2023-09-15 NOTE — Telephone Encounter (Signed)
 Copied from CRM [id number]. Topic: Clinical - Medication Refill >> Sep 15, 2023  3:22 PM Emylou G wrote: Medication: atorvastatin  (LIPITOR ) 80 MG tablet  Has the patient contacted their pharmacy? Yes (Agent: If no, request that the patient contact the pharmacy for the refill. If patient does not wish to contact the pharmacy document the reason why and proceed with request.) (Agent: If yes, when and what did the pharmacy advise?) didn't see it  This is the patient's preferred pharmacy:  [facility] [address], [place] - [address] [place] [address] Phone: [phone] Fax: [phone]   Is this the correct pharmacy for this prescription? Yes If no, delete pharmacy and type the correct one.   Has the prescription been filled recently? No  Is the patient out of the medication? Yes  Has the patient been seen for an appointment in the last year OR does the patient have an upcoming appointment? Yes  Can we respond through MyChart? No  Agent: Please be advised that Rx refills may take up to 3 business days. We ask that you follow-up with your pharmacy.

## 2023-09-18 DIAGNOSIS — N186 End stage renal disease: Secondary | ICD-10-CM | POA: Diagnosis not present

## 2023-09-18 DIAGNOSIS — Z992 Dependence on renal dialysis: Secondary | ICD-10-CM | POA: Diagnosis not present

## 2023-09-19 DIAGNOSIS — N2581 Secondary hyperparathyroidism of renal origin: Secondary | ICD-10-CM | POA: Diagnosis not present

## 2023-09-19 DIAGNOSIS — D631 Anemia in chronic kidney disease: Secondary | ICD-10-CM | POA: Diagnosis not present

## 2023-09-19 DIAGNOSIS — Z992 Dependence on renal dialysis: Secondary | ICD-10-CM | POA: Diagnosis not present

## 2023-09-19 DIAGNOSIS — D509 Iron deficiency anemia, unspecified: Secondary | ICD-10-CM | POA: Diagnosis not present

## 2023-09-19 DIAGNOSIS — N186 End stage renal disease: Secondary | ICD-10-CM | POA: Diagnosis not present

## 2023-10-18 DIAGNOSIS — Z992 Dependence on renal dialysis: Secondary | ICD-10-CM | POA: Diagnosis not present

## 2023-10-18 DIAGNOSIS — N186 End stage renal disease: Secondary | ICD-10-CM | POA: Diagnosis not present

## 2023-10-19 DIAGNOSIS — D631 Anemia in chronic kidney disease: Secondary | ICD-10-CM | POA: Diagnosis not present

## 2023-10-19 DIAGNOSIS — N186 End stage renal disease: Secondary | ICD-10-CM | POA: Diagnosis not present

## 2023-10-19 DIAGNOSIS — N2581 Secondary hyperparathyroidism of renal origin: Secondary | ICD-10-CM | POA: Diagnosis not present

## 2023-10-19 DIAGNOSIS — D509 Iron deficiency anemia, unspecified: Secondary | ICD-10-CM | POA: Diagnosis not present

## 2023-10-19 DIAGNOSIS — Z992 Dependence on renal dialysis: Secondary | ICD-10-CM | POA: Diagnosis not present

## 2023-11-10 ENCOUNTER — Other Ambulatory Visit: Payer: Self-pay

## 2023-11-10 ENCOUNTER — Ambulatory Visit (HOSPITAL_COMMUNITY)
Admission: RE | Disposition: A | Discharge status: Home / Self Care | Payer: Self-pay | Source: Home / Self Care | Attending: Vascular Surgery

## 2023-11-10 ENCOUNTER — Ambulatory Visit (HOSPITAL_COMMUNITY)
Admission: RE | Admit: 2023-11-10 | Discharge: 2023-11-10 | Disposition: A | Discharge status: Home / Self Care | Attending: Vascular Surgery | Admitting: Vascular Surgery

## 2023-11-10 DIAGNOSIS — N186 End stage renal disease: Secondary | ICD-10-CM

## 2023-11-10 DIAGNOSIS — Y832 Surgical operation with anastomosis, bypass or graft as the cause of abnormal reaction of the patient, or of later complication, without mention of misadventure at the time of the procedure: Secondary | ICD-10-CM | POA: Insufficient documentation

## 2023-11-10 DIAGNOSIS — T82858A Stenosis of vascular prosthetic devices, implants and grafts, initial encounter: Secondary | ICD-10-CM | POA: Diagnosis not present

## 2023-11-10 DIAGNOSIS — Z992 Dependence on renal dialysis: Secondary | ICD-10-CM | POA: Diagnosis not present

## 2023-11-10 DIAGNOSIS — E1122 Type 2 diabetes mellitus with diabetic chronic kidney disease: Secondary | ICD-10-CM | POA: Diagnosis not present

## 2023-11-10 HISTORY — PX: VENOUS ANGIOPLASTY: CATH118376

## 2023-11-10 HISTORY — PX: A/V SHUNT INTERVENTION: CATH118220

## 2023-11-10 LAB — GLUCOSE, CAPILLARY: Glucose-Capillary: 76 mg/dL (ref 70–99)

## 2023-11-10 SURGERY — A/V SHUNT INTERVENTION
Anesthesia: LOCAL | Site: Arm Upper | Laterality: Left

## 2023-11-10 MED ORDER — HEPARIN SODIUM (PORCINE) 1000 UNIT/ML IJ SOLN
INTRAMUSCULAR | Status: AC
Start: 2023-11-10 — End: 2023-11-10
  Filled 2023-11-10: qty 10

## 2023-11-10 MED ORDER — LIDOCAINE HCL (PF) 1 % IJ SOLN
INTRAMUSCULAR | Status: AC
  Filled 2023-11-10: qty 30

## 2023-11-10 MED ORDER — LIDOCAINE HCL (PF) 1 % IJ SOLN
INTRAMUSCULAR | Status: DC | PRN
  Administered 2023-11-10: 5 mL

## 2023-11-10 MED ORDER — IODIXANOL 320 MG/ML IV SOLN
INTRAVENOUS | Status: DC | PRN
  Administered 2023-11-10: 20 mL via INTRAVENOUS

## 2023-11-10 MED ORDER — HEPARIN (PORCINE) IN NACL 1000-0.9 UT/500ML-% IV SOLN
INTRAVENOUS | Status: DC | PRN
  Administered 2023-11-10: 500 mL

## 2023-11-10 SURGICAL SUPPLY — 7 items
BALLOON MUSTANG 12.0X40 75 (BALLOONS) IMPLANT
KIT ENCORE 26 ADVANTAGE (KITS) IMPLANT
KIT MICROPUNCTURE NIT STIFF (SHEATH) IMPLANT
SHEATH PINNACLE R/O II 7F 4CM (SHEATH) IMPLANT
TRAY PV CATH (CUSTOM PROCEDURE TRAY) ×2 IMPLANT
TUBING CIL FLEX 10 FLL-RA (TUBING) IMPLANT
WIRE BENTSON .035X145CM (WIRE) IMPLANT

## 2023-11-10 NOTE — Op Note (Signed)
    Patient name: [name] [name] MRN: [id number] DOB: 06-19-1950 Sex: male  11/10/2023 Pre-operative Diagnosis: ESRD on HD Post-operative diagnosis:  Same Surgeon:  Norman GORMAN Serve, MD Procedure Performed:  Ultrasound-guided access of left arm AV fistula Fistulogram and central venogram Balloon angioplasty of the innominate vein stenosis, 12 mm x 40 mm Mustang  Indications: [name] is a 73 year old male with ESRD who presents to the HD access center for fistulogram today.  Dialysis sessions have been running okay but he was noticed to have a decreased bruit he had his last evaluation.  His last HD session was yesterday.  Recent benefits of fistulogram with intervention were reviewed and he elected to proceed.  Findings:  The SVC was widely patent.  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  The subclavian vein was widely patent.  The AV fistula and anastomosis were widely patent.   Procedure:  The patient was identified in the holding area and taken to the cath lab  The patient was then placed supine on the table and prepped and draped in the usual sterile fashion.  A time out was called.  Ultrasound was used to evaluate the left arm AV access. This was accessed under u/s guidance. An 018 wire was advanced without resistance, a micropuncture sheath was placed and fistulagram obtained which demonstrated the above findings.  This access was then upsized to a 6 F short sheath over a glidewire.  The lesion was crossed and treated with a 12 mm x 40 mm Mustang balloon.  Completion angiography demonstrated less than 20% residual stenosis.  The wire and sheath were removed and the access was managed with a 4 Monocryl figure-of-eight suture for hemostasis.  Contrast: 20 cc   Impression: Successful balloon angioplasty of the 80% innominate vein stenosis with a residual stenosis of less than 20%.   Norman GORMAN Serve MD [facility] Office:  [phone]

## 2023-11-10 NOTE — H&P (Signed)
  HD ACCESS CENTER H&P   Patient ID: Ruel Dimmick, male   DOB: March 25, 1951, 73 y.o.   MRN: [id number]  Subjective:     HPI Fadi Menter is a 73 y.o. male with ESRD presenting to the HD access center for intervention.  Past Medical History:  Diagnosis Date   Asthma    CAD (coronary artery disease)    Chronic edema    Chronic HFrEF (heart failure with reduced ejection fraction) (HCC)    Depression    Diabetes mellitus (HCC)    ESRD on hemodialysis (HCC)    MGUS (monoclonal gammopathy of unknown significance)    Obesity    prior weight loss   Family History  Problem Relation Age of Onset   Breast cancer Mother        died 62   Emphysema Father        died 17   Diabetes Sister    Diabetes Brother    Past Surgical History:  Procedure Laterality Date   ABCESS DRAINAGE     on back - possible spider bite   AV FISTULA PLACEMENT Left 12/23/2020   Procedure: LEFT ARM ARTERIOVENOUS (AV) FISTULA CREATION;  Surgeon: Oris Krystal FALCON, MD;  Location: [facility];  Service: Vascular;  Laterality: Left;   BASCILIC VEIN TRANSPOSITION Left 02/10/2021   Procedure: LEFT ARM SECOND STAGE BASILIC VEIN TRANSPOSITION;  Surgeon: Oris Krystal FALCON, MD;  Location: [facility];  Service: Vascular;  Laterality: Left;   COLONOSCOPY     CORONARY STENT INTERVENTION N/A 05/26/2021   Procedure: CORONARY STENT INTERVENTION;  Surgeon: Burnard Debby LABOR, MD;  Location: [facility];  Service: Cardiovascular;  Laterality: N/A;   INSERTION OF DIALYSIS CATHETER Right 12/23/2020   Procedure: INSERTION OF PALINDROME DIALYSIS CATHETER;  Surgeon: Oris Krystal FALCON, MD;  Location: [facility];  Service: Vascular;  Laterality: Right;   LEFT HEART CATH AND CORONARY ANGIOGRAPHY N/A 05/26/2021   Procedure: LEFT HEART CATH AND CORONARY ANGIOGRAPHY;  Surgeon: Burnard Debby LABOR, MD;  [facility]: [facility];  Service: Cardiovascular;  Laterality: N/A;   REMOVAL OF A DIALYSIS CATHETER N/A 05/05/2021   Procedure: MINOR REMOVAL OF A TUNNELED  DIALYSIS CATHETER;  Surgeon: Oris Krystal FALCON, MD;  Location: [facility];  Service: Vascular;  Laterality: N/A;    Short Social History:  Social History   Tobacco Use   Smoking status: Never   Smokeless tobacco: Never  Substance Use Topics   Alcohol use: Not Currently    No Known Allergies  No current facility-administered medications for this encounter.    REVIEW OF SYSTEMS All other systems were reviewed and are negative     Objective:   Objective   Vitals:   11/10/23 0924 11/10/23 0932  BP: (!) 169/79 (!) 165/87  Pulse: 71 68  Resp: 12 12  Temp: 98.4 F (36.9 C)   TempSrc: Oral   SpO2: 99% 98%   There is no height or weight on file to calculate BMI.  Physical Exam General: no acute distress Cardiac: hemodynamically stable Extremities: Palpable thrill in left arm brachiobasilic fistula      Assessment/Plan:   Demarian Epps is a 73 y.o. male with ESRD presenting for fistulogram.  Having issues with decreased bruit, no issues with HD sessions. Last HD session yesterday. Reviewed risks and benefits of fistulogram with intervention and patient agreed to proceed.   Norman Serve, MD [facility]

## 2023-11-11 ENCOUNTER — Encounter (HOSPITAL_COMMUNITY): Payer: Self-pay | Admitting: Vascular Surgery

## 2023-11-11 MED FILL — Heparin Sodium (Porcine) Inj 1000 Unit/ML: INTRAMUSCULAR | Qty: 10 | Status: AC

## 2023-11-18 DIAGNOSIS — D509 Iron deficiency anemia, unspecified: Secondary | ICD-10-CM | POA: Diagnosis not present

## 2023-11-18 DIAGNOSIS — D631 Anemia in chronic kidney disease: Secondary | ICD-10-CM | POA: Diagnosis not present

## 2023-11-18 DIAGNOSIS — Z992 Dependence on renal dialysis: Secondary | ICD-10-CM | POA: Diagnosis not present

## 2023-11-18 DIAGNOSIS — N2581 Secondary hyperparathyroidism of renal origin: Secondary | ICD-10-CM | POA: Diagnosis not present

## 2023-11-18 DIAGNOSIS — N186 End stage renal disease: Secondary | ICD-10-CM | POA: Diagnosis not present

## 2023-12-19 DIAGNOSIS — D509 Iron deficiency anemia, unspecified: Secondary | ICD-10-CM | POA: Diagnosis not present

## 2023-12-19 DIAGNOSIS — D631 Anemia in chronic kidney disease: Secondary | ICD-10-CM | POA: Diagnosis not present

## 2023-12-19 DIAGNOSIS — N2581 Secondary hyperparathyroidism of renal origin: Secondary | ICD-10-CM | POA: Diagnosis not present

## 2023-12-19 DIAGNOSIS — N186 End stage renal disease: Secondary | ICD-10-CM | POA: Diagnosis not present

## 2023-12-19 DIAGNOSIS — Z992 Dependence on renal dialysis: Secondary | ICD-10-CM | POA: Diagnosis not present

## 2024-01-03 ENCOUNTER — Ambulatory Visit (INDEPENDENT_AMBULATORY_CARE_PROVIDER_SITE_OTHER): Admitting: Internal Medicine

## 2024-01-03 ENCOUNTER — Encounter: Payer: Self-pay | Admitting: Internal Medicine

## 2024-01-03 VITALS — BP 119/62 | HR 65 | Ht 72.0 in | Wt 221.6 lb

## 2024-01-03 DIAGNOSIS — I5032 Chronic diastolic (congestive) heart failure: Secondary | ICD-10-CM

## 2024-01-03 DIAGNOSIS — N186 End stage renal disease: Secondary | ICD-10-CM | POA: Diagnosis not present

## 2024-01-03 DIAGNOSIS — E1169 Type 2 diabetes mellitus with other specified complication: Secondary | ICD-10-CM

## 2024-01-03 DIAGNOSIS — E669 Obesity, unspecified: Secondary | ICD-10-CM

## 2024-01-03 DIAGNOSIS — I10 Essential (primary) hypertension: Secondary | ICD-10-CM

## 2024-01-03 DIAGNOSIS — Z23 Encounter for immunization: Secondary | ICD-10-CM | POA: Diagnosis not present

## 2024-01-03 DIAGNOSIS — I2510 Atherosclerotic heart disease of native coronary artery without angina pectoris: Secondary | ICD-10-CM | POA: Diagnosis not present

## 2024-01-03 MED ORDER — SPIKEVAX 50 MCG/0.5ML IM SUSY: 0.5000 mL | PREFILLED_SYRINGE | Freq: Once | INTRAMUSCULAR | 0 refills | Status: AC

## 2024-01-03 NOTE — Assessment & Plan Note (Signed)
 Has h/o CAD On Metoprolol  Appears euvolemic currently Recent echocardiogram showed LVEF of 50 to 55%

## 2024-01-03 NOTE — Patient Instructions (Signed)
 Please continue to take medications as prescribed.  Please continue to follow low salt diet and perform moderate exercise/walking as tolerated.

## 2024-01-03 NOTE — Assessment & Plan Note (Signed)
S/p stent placement in 2022 On Plavix and statin Denies any anginal chest pain or dyspnea currently - has SL NTG PRN Followed by cardiology

## 2024-01-03 NOTE — Assessment & Plan Note (Signed)
 On HD - MWF Has minimal urine output, denies urinary complaints On sevelamer  Blood tests from HD center reviewed Undergoing renal transplant evaluation at [facility] now

## 2024-01-03 NOTE — Assessment & Plan Note (Signed)
 Lab Results  Component Value Date   HGBA1C 5.7 04/15/2023   Was on insulin  in the past, currently diet controlled since starting HD in 12/2020 Advised to follow diabetic diet On statin F/u CMP and lipid panel Diabetic eye exam: Advised to follow up with Ophthalmology for diabetic eye exam

## 2024-01-03 NOTE — Progress Notes (Unsigned)
 Established Patient Office Visit  Subjective:  Patient ID: [name] [name], male    DOB: [birth year]  Age: 73 y.o. MRN: [id number]  CC:  Chief Complaint  Patient presents with   Diabetes    6 month f/u    Hypertension    6 month f/u     HPI [name] [name] is a 73 y.o. male with past medical history of CAD s/p stent placement, ESRD on HD, type 2 DM, HTN and asthma who presents for f/u of his chronic medical conditions.  CAD and HTN: He had stent placement in 12/22, and is currently on DAPT and statin.  He denies any chest pain, dyspnea or palpitations currently.  His BP is well controlled currently.  He takes metoprolol  for history of CAD.  He has been placed on midodrine before HD to avoid hypotension.  He also takes statin for HLD.  Type II DM: He was on insulin  regimen in the past, but is currently diet controlled since he has lost about 100 pounds and is on HD for ESRD now.  He denies any fatigue, polyuria or polydipsia currently.  He has AV fistula for HD.  He denies any urinary complaint currently.  He is being evaluated for kidney transplant at [facility] now.   He takes pantoprazole  for GERD.  Denies any dysphagia or odynophagia currently.       Past Medical History:  Diagnosis Date   Asthma    CAD (coronary artery disease)    Chronic edema    Chronic HFrEF (heart failure with reduced ejection fraction) (HCC)    Depression    Diabetes mellitus (HCC)    ESRD on hemodialysis (HCC)    MGUS (monoclonal gammopathy of unknown significance)    Obesity    prior weight loss    Past Surgical History:  Procedure Laterality Date   A/V SHUNT INTERVENTION Left 11/10/2023   Procedure: A/V SHUNT INTERVENTION;  Surgeon: Pearline Norman RAMAN, MD;  Location: [facility];  Service: Cardiovascular;  Laterality: Left;   ABCESS DRAINAGE     on back - possible spider bite   AV FISTULA PLACEMENT Left 12/23/2020   Procedure: LEFT ARM ARTERIOVENOUS (AV) FISTULA CREATION;  Surgeon: Oris Krystal FALCON, MD;  Location: [facility];  Service: Vascular;  Laterality: Left;   BASCILIC VEIN TRANSPOSITION Left 02/10/2021   Procedure: LEFT ARM SECOND STAGE BASILIC VEIN TRANSPOSITION;  Surgeon: Oris Krystal FALCON, MD;  Location: [facility];  Service: Vascular;  Laterality: Left;   COLONOSCOPY     CORONARY STENT INTERVENTION N/A 05/26/2021   Procedure: CORONARY STENT INTERVENTION;  Surgeon: Burnard Debby LABOR, MD;  [facility];  Service: Cardiovascular;  Laterality: N/A;   INSERTION OF DIALYSIS CATHETER Right 12/23/2020   Procedure: INSERTION OF PALINDROME DIALYSIS CATHETER;  Surgeon: Oris Krystal FALCON, MD;  Location: [facility];  Service: Vascular;  Laterality: Right;   LEFT HEART CATH AND CORONARY ANGIOGRAPHY N/A 05/26/2021   Procedure: LEFT HEART CATH AND CORONARY ANGIOGRAPHY;  Surgeon: Burnard Debby LABOR, MD;  [facility];  Service: Cardiovascular;  Laterality: N/A;   REMOVAL OF A DIALYSIS CATHETER N/A 05/05/2021   Procedure: MINOR REMOVAL OF A TUNNELED DIALYSIS CATHETER;  Surgeon: Oris Krystal FALCON, MD;  Location: [facility];  Service: Vascular;  Laterality: N/A;   VENOUS ANGIOPLASTY  11/10/2023   Procedure: VENOUS ANGIOPLASTY;  Surgeon: Pearline Norman RAMAN, MD;  Location: [facility];  Service: Cardiovascular;;  innominate 80%    Family History  Problem Relation Age of Onset   Breast cancer Mother        died 30   Emphysema Father        died 84   Diabetes Sister    Diabetes Brother     Social History   Socioeconomic History   Marital status: Married    Spouse name: Not on file   Number of children: Not on file   Years of education: Not on file   Highest education level: Not on file  Occupational History   Not on file  Tobacco Use   Smoking status: Never   Smokeless tobacco: Never  Vaping Use   Vaping status: Never Used  Substance and Sexual Activity   Alcohol use: Not Currently   Drug use: Never   Sexual activity: Not Currently  Other Topics Concern   Not on file  Social History  Narrative   Not on file   Social Drivers of Health   Financial Resource Strain: Low Risk  (07/26/2023)   Overall Financial Resource Strain (CARDIA)    Difficulty of Paying Living Expenses: Not hard at all  Food Insecurity: No Food Insecurity (07/26/2023)   Hunger Vital Sign    Worried About Running Out of Food in the Last Year: Never true    Ran Out of Food in the Last Year: Never true  Transportation Needs: No Transportation Needs (07/26/2023)   PRAPARE - Administrator, Civil Service (Medical): No    Lack of Transportation (Non-Medical): No  Physical Activity: Patient Declined (07/26/2023)   Exercise Vital Sign    Days of Exercise per Week: Patient declined    Minutes of Exercise per Session: Patient declined  Stress: No Stress Concern Present (07/26/2023)   [employer] of Occupational Health - Occupational Stress Questionnaire    Feeling of Stress : Not at all  Social Connections: Moderately Isolated (07/26/2023)   Social Connection and Isolation Panel    Frequency of Communication with Friends and Family: More than three times a week    Frequency of Social Gatherings with Friends and Family: More than three times a week    Attends Religious Services: Never    Database administrator or Organizations: No    Attends Banker Meetings: Never    Marital Status: Married  Catering manager Violence: Not At Risk (07/26/2023)   Humiliation, Afraid, Rape, and Kick questionnaire    Fear of Current or Ex-Partner: No    Emotionally Abused: No    Physically Abused: No    Sexually Abused: No    Outpatient Medications Prior to Visit  Medication Sig Dispense Refill   albuterol  (VENTOLIN  HFA) 108 (90 Base) MCG/ACT inhaler Inhale 1-2 puffs into the lungs every 6 (six) hours as needed for wheezing or shortness of breath. 8 g 2   atorvastatin  (LIPITOR ) 80 MG tablet Take 1 tablet (80 mg total) by mouth daily. 90 tablet 3   budesonide -formoterol  (BREYNA ) 160-4.5 MCG/ACT inhaler  Inhale 2 puffs into the lungs 2 (two) times daily.     clopidogrel  (PLAVIX ) 75 MG tablet Take 1 tablet by mouth once daily 30 tablet 7   metoprolol  succinate (TOPROL -XL) 25 MG 24 hr tablet TAKE 1 TABLET BY MOUTH IN THE MORNING AND AT BEDTIME 180 tablet 3   midodrine (PROAMATINE) 5 MG tablet Take 5 mg by mouth every other day. Before HD     nitroGLYCERIN  (NITROSTAT ) 0.4 MG SL tablet Place 1 tablet (0.4 mg total) under the  tongue every 5 (five) minutes as needed for chest pain. 30 tablet 12   pantoprazole  (PROTONIX ) 40 MG tablet Take 1 tablet (40 mg total) by mouth daily. 90 tablet 3   sevelamer  (RENAGEL ) 800 MG tablet Take 800 mg by mouth 3 (three) times daily with meals.     Tenapanor HCl, CKD, (XPHOZAH) 30 MG TABS Take 30 mg by mouth 2 (two) times daily.     triamcinolone  cream (KENALOG ) 0.1 % Apply 1 Application topically 2 (two) times daily. 30 g 0   benzonatate  (TESSALON ) 100 MG capsule TAKE 1 CAPSULE BY MOUTH TWICE DAILY AS NEEDED FOR COUGH 30 capsule 0   No facility-administered medications prior to visit.    No Known Allergies  ROS Review of Systems  Constitutional:  Negative for chills and fever.  HENT:  Negative for congestion and sore throat.   Eyes:  Negative for pain and discharge.  Respiratory:  Negative for cough and shortness of breath.   Cardiovascular:  Negative for chest pain and palpitations.  Gastrointestinal:  Negative for diarrhea, nausea and vomiting.  Endocrine: Negative for polydipsia and polyuria.  Genitourinary:  Negative for dysuria and hematuria.  Musculoskeletal:  Negative for neck pain and neck stiffness.  Skin:  Negative for rash.  Neurological:  Negative for dizziness and weakness.  Psychiatric/Behavioral:  Negative for agitation and behavioral problems.       Objective:    Physical Exam Vitals reviewed.  Constitutional:      General: He is not in acute distress.    Appearance: He is not diaphoretic.  HENT:     Head: Normocephalic and  atraumatic.     Nose: Nose normal.     Mouth/Throat:     Mouth: Mucous membranes are moist.  Eyes:     General: No scleral icterus.    Extraocular Movements: Extraocular movements intact.  Cardiovascular:     Rate and Rhythm: Normal rate and regular rhythm.     Heart sounds: Normal heart sounds. No murmur heard. Pulmonary:     Breath sounds: Normal breath sounds. No wheezing or rales.  Musculoskeletal:     Cervical back: Neck supple. No tenderness.     Right lower leg: No edema.     Left lower leg: No edema.  Skin:    General: Skin is warm.     Findings: No rash.     Comments: AV fistula on left UE  Neurological:     General: No focal deficit present.     Mental Status: He is alert and oriented to person, place, and time.     Sensory: No sensory deficit.     Motor: No weakness.  Psychiatric:        Mood and Affect: Mood normal.        Behavior: Behavior normal.     BP 119/62 (BP Location: Right Arm)   Pulse 65   Ht 6' (1.829 m)   Wt 221 lb 9.6 oz (100.5 kg)   SpO2 98%   BMI 30.05 kg/m  Wt Readings from Last 3 Encounters:  01/03/24 221 lb 9.6 oz (100.5 kg)  07/26/23 245 lb (111.1 kg)  07/07/23 248 lb (112.5 kg)    Lab Results  Component Value Date   TSH 1.040 06/24/2022   Lab Results  Component Value Date   WBC 5.3 11/03/2022   HGB 10.5 (A) 05/30/2023   HCT 36.3 (L) 11/03/2022   MCV 92.4 11/03/2022   PLT 195 11/03/2022   Lab Results  Component Value Date   NA 134 (L) 11/03/2022   K 4.4 05/30/2023   CO2 27 11/03/2022   GLUCOSE 175 (H) 11/03/2022   BUN 34 (H) 11/03/2022   CREATININE 6.82 (H) 11/03/2022   BILITOT 0.4 11/03/2022   ALKPHOS 115 11/03/2022   AST 18 11/03/2022   ALT 16 11/03/2022   PROT 6.7 11/03/2022   ALBUMIN 3.3 (L) 11/03/2022   CALCIUM  8.5 (L) 11/03/2022   ANIONGAP 12 11/03/2022   EGFR 6 (L) 06/24/2022   GFR 33.04 (L) 04/24/2018   Lab Results  Component Value Date   CHOL 107 04/15/2023   Lab Results  Component Value Date    HDL 55 06/24/2022   Lab Results  Component Value Date   LDLCALC 74 06/24/2022   Lab Results  Component Value Date   TRIG 82 06/24/2022   Lab Results  Component Value Date   CHOLHDL 2.6 06/24/2022   Lab Results  Component Value Date   HGBA1C 5.2 01/03/2024      Assessment & Plan:   Problem List Items Addressed This Visit       Cardiovascular and Mediastinum   CAD (coronary artery disease) (Chronic)   S/p stent placement in 2022 On Plavix  and statin Denies any anginal chest pain or dyspnea currently - has SL NTG PRN Followed by cardiology      Heart failure with improved ejection fraction (HFimpEF) (HCC) - Primary (Chronic)   Has h/o CAD On Metoprolol  Appears euvolemic currently Recent echocardiogram showed LVEF of 50 to 55%      Essential hypertension   BP Readings from Last 1 Encounters:  01/03/24 119/62   Well-controlled with Metoprolol  Counseled for compliance with the medications Advised DASH diet and moderate exercise/walking, at least 150 mins/week        Endocrine   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)   Lab Results  Component Value Date   HGBA1C 5.2 01/03/2024   Was on insulin  in the past, currently diet controlled since starting HD in 12/2020 Advised to follow diabetic diet On statin F/u CMP and lipid panel Diabetic eye exam: Advised to follow up with Ophthalmology for diabetic eye exam      Relevant Orders   Bayer DCA Hb A1c Waived (Completed)     Genitourinary   ESRD (end stage renal disease) (HCC) (Chronic)   On HD - MWF Has minimal urine output, denies urinary complaints On sevelamer  Blood tests from HD center reviewed Undergoing renal transplant evaluation at [facility] now      Other Visit Diagnoses       Need for COVID-19 vaccine         Encounter for immunization       Relevant Orders   Flu vaccine HIGH DOSE PF(Fluzone Trivalent) (Completed)        Meds ordered this encounter  Medications    COVID-19 mRNA vaccine (SPIKEVAX ) syringe    Sig: Inject 0.5 mLs into the muscle once for 1 dose.    Dispense:  0.5 mL    Refill:  0    Okay to administer Pfizer vaccine if Moderna is not available.    Follow-up: Return in about 6 months (around 07/02/2024) for Annual physical (after 06/28/24).    Suzzane MARLA Blanch, MD

## 2024-01-03 NOTE — Assessment & Plan Note (Signed)
 BP Readings from Last 1 Encounters:  01/03/24 119/62   Well-controlled with Metoprolol  Counseled for compliance with the medications Advised DASH diet and moderate exercise/walking, at least 150 mins/week

## 2024-01-04 ENCOUNTER — Ambulatory Visit: Payer: Self-pay | Admitting: Internal Medicine

## 2024-01-04 LAB — BAYER DCA HB A1C WAIVED: HB A1C (BAYER DCA - WAIVED): 5.2 % (ref 4.8–5.6)

## 2024-01-18 DIAGNOSIS — N2581 Secondary hyperparathyroidism of renal origin: Secondary | ICD-10-CM | POA: Diagnosis not present

## 2024-01-18 DIAGNOSIS — Z992 Dependence on renal dialysis: Secondary | ICD-10-CM | POA: Diagnosis not present

## 2024-01-18 DIAGNOSIS — D631 Anemia in chronic kidney disease: Secondary | ICD-10-CM | POA: Diagnosis not present

## 2024-01-18 DIAGNOSIS — D509 Iron deficiency anemia, unspecified: Secondary | ICD-10-CM | POA: Diagnosis not present

## 2024-01-25 NOTE — Progress Notes (Signed)
 Tylor Courtwright                                          MRN: [id number]   01/25/2024   The [facility] Quality Team Specialist reviewed this patient medical record for the purposes of chart review for care gap closure. The following were reviewed: abstraction for care gap closure-glycemic status assessment.    [facility] Quality Team

## 2024-02-09 DIAGNOSIS — Z125 Encounter for screening for malignant neoplasm of prostate: Secondary | ICD-10-CM | POA: Diagnosis not present

## 2024-02-09 DIAGNOSIS — Z01818 Encounter for other preprocedural examination: Secondary | ICD-10-CM | POA: Diagnosis not present

## 2024-02-09 DIAGNOSIS — Z114 Encounter for screening for human immunodeficiency virus [HIV]: Secondary | ICD-10-CM | POA: Diagnosis not present

## 2024-02-09 DIAGNOSIS — R9431 Abnormal electrocardiogram [ECG] [EKG]: Secondary | ICD-10-CM | POA: Diagnosis not present

## 2024-02-09 DIAGNOSIS — Z7289 Other problems related to lifestyle: Secondary | ICD-10-CM | POA: Diagnosis not present

## 2024-02-09 DIAGNOSIS — N186 End stage renal disease: Secondary | ICD-10-CM | POA: Diagnosis not present

## 2024-02-09 DIAGNOSIS — E1129 Type 2 diabetes mellitus with other diabetic kidney complication: Secondary | ICD-10-CM | POA: Diagnosis not present

## 2024-02-09 DIAGNOSIS — R001 Bradycardia, unspecified: Secondary | ICD-10-CM | POA: Diagnosis not present

## 2024-02-09 DIAGNOSIS — I440 Atrioventricular block, first degree: Secondary | ICD-10-CM | POA: Diagnosis not present

## 2024-02-18 DIAGNOSIS — Z992 Dependence on renal dialysis: Secondary | ICD-10-CM | POA: Diagnosis not present

## 2024-02-18 DIAGNOSIS — N186 End stage renal disease: Secondary | ICD-10-CM | POA: Diagnosis not present

## 2024-02-20 DIAGNOSIS — D509 Iron deficiency anemia, unspecified: Secondary | ICD-10-CM | POA: Diagnosis not present

## 2024-02-20 DIAGNOSIS — D631 Anemia in chronic kidney disease: Secondary | ICD-10-CM | POA: Diagnosis not present

## 2024-02-20 DIAGNOSIS — Z992 Dependence on renal dialysis: Secondary | ICD-10-CM | POA: Diagnosis not present

## 2024-02-20 DIAGNOSIS — N186 End stage renal disease: Secondary | ICD-10-CM | POA: Diagnosis not present

## 2024-02-20 DIAGNOSIS — N2581 Secondary hyperparathyroidism of renal origin: Secondary | ICD-10-CM | POA: Diagnosis not present

## 2024-02-21 DIAGNOSIS — N189 Chronic kidney disease, unspecified: Secondary | ICD-10-CM | POA: Diagnosis not present

## 2024-02-21 DIAGNOSIS — I129 Hypertensive chronic kidney disease with stage 1 through stage 4 chronic kidney disease, or unspecified chronic kidney disease: Secondary | ICD-10-CM | POA: Diagnosis not present

## 2024-02-21 DIAGNOSIS — Z01810 Encounter for preprocedural cardiovascular examination: Secondary | ICD-10-CM | POA: Diagnosis not present

## 2024-02-21 DIAGNOSIS — R9439 Abnormal result of other cardiovascular function study: Secondary | ICD-10-CM | POA: Diagnosis not present

## 2024-02-21 DIAGNOSIS — E1122 Type 2 diabetes mellitus with diabetic chronic kidney disease: Secondary | ICD-10-CM | POA: Diagnosis not present

## 2024-03-08 ENCOUNTER — Ambulatory Visit: Admitting: Internal Medicine

## 2024-03-08 ENCOUNTER — Telehealth: Payer: Self-pay

## 2024-03-08 NOTE — Telephone Encounter (Signed)
 Copied from CRM #[id number]. Topic: Clinical - Medical Advice >> Mar 08, 2024 11:40 AM Harlene ORN wrote: Reason for CRM: Patient called to try to schedule an appointment for today. 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 Patient is scheduled for 11/24 with Nurse Bluford. Please call back to discuss advise with the patient.

## 2024-03-08 NOTE — Telephone Encounter (Signed)
 done

## 2024-03-13 ENCOUNTER — Ambulatory Visit (INDEPENDENT_AMBULATORY_CARE_PROVIDER_SITE_OTHER): Admitting: Internal Medicine

## 2024-03-13 ENCOUNTER — Ambulatory Visit: Payer: Self-pay | Admitting: Nurse Practitioner

## 2024-03-13 ENCOUNTER — Encounter: Payer: Self-pay | Admitting: Internal Medicine

## 2024-03-13 ENCOUNTER — Telehealth: Payer: Self-pay

## 2024-03-13 VITALS — BP 118/80 | HR 71 | Ht 72.0 in | Wt 221.8 lb

## 2024-03-13 DIAGNOSIS — A530 Latent syphilis, unspecified as early or late: Secondary | ICD-10-CM

## 2024-03-13 DIAGNOSIS — A515 Early syphilis, latent: Secondary | ICD-10-CM | POA: Insufficient documentation

## 2024-03-13 MED ORDER — DOXYCYCLINE HYCLATE 100 MG PO TABS: 100.0000 mg | ORAL_TABLET | Freq: Two times a day (BID) | ORAL | 0 refills | Status: DC

## 2024-03-13 NOTE — Telephone Encounter (Signed)
 Copied from CRM #[id number]. Topic: General - Other >> Mar 13, 2024  1:11 PM Santiya F wrote: Reason for CRM: Patient is calling in because he is returning a call from the office. Patient also provided the information for [facility]. He says the woman's name is Calton Fetters and her phone number is [phone]

## 2024-03-13 NOTE — Patient Instructions (Signed)
 We will contact you to get Penicillin injection in the office.

## 2024-03-13 NOTE — Progress Notes (Unsigned)
 Acute Office Visit  Subjective:    Patient ID: [name] [name], male    DOB: [birth year], 73 y.o.   MRN: [id number]  Chief Complaint  Patient presents with  . Results    Would like to go over labs and treatment options    HPI Patient is in today for ***  Past Medical History:  Diagnosis Date  . Asthma   . CAD (coronary artery disease)   . Chronic edema   . Chronic HFrEF (heart failure with reduced ejection fraction) (HCC)   . Depression   . Diabetes mellitus (HCC)   . ESRD on hemodialysis (HCC)   . MGUS (monoclonal gammopathy of unknown significance)   . Obesity    prior weight loss    Past Surgical History:  Procedure Laterality Date  . A/V SHUNT INTERVENTION Left 11/10/2023   Procedure: A/V SHUNT INTERVENTION;  Surgeon: Pearline Norman RAMAN, MD;  Location: [facility];  Service: Cardiovascular;  Laterality: Left;  . ABCESS DRAINAGE     on back - possible spider bite  . AV FISTULA PLACEMENT Left 12/23/2020   Procedure: LEFT ARM ARTERIOVENOUS (AV) FISTULA CREATION;  Surgeon: Oris Krystal FALCON, MD;  Location: [facility];  Service: Vascular;  Laterality: Left;  . BASCILIC VEIN TRANSPOSITION Left 02/10/2021   Procedure: LEFT ARM SECOND STAGE BASILIC VEIN TRANSPOSITION;  Surgeon: Oris Krystal FALCON, MD;  Location: [facility];  Service: Vascular;  Laterality: Left;  . COLONOSCOPY    . CORONARY STENT INTERVENTION N/A 05/26/2021   Procedure: CORONARY STENT INTERVENTION;  Surgeon: Burnard Debby LABOR, MD;  Location: [facility] INVASIVE CV LAB;  Service: Cardiovascular;  Laterality: N/A;  . INSERTION OF DIALYSIS CATHETER Right 12/23/2020   Procedure: INSERTION OF PALINDROME DIALYSIS CATHETER;  Surgeon: Oris Krystal FALCON, MD;  Location: [facility];  Service: Vascular;  Laterality: Right;  . LEFT HEART CATH AND CORONARY ANGIOGRAPHY N/A 05/26/2021   Procedure: LEFT HEART CATH AND CORONARY ANGIOGRAPHY;  Surgeon: Burnard Debby LABOR, MD;  Location: [facility] INVASIVE CV LAB;  Service: Cardiovascular;  Laterality: N/A;  . REMOVAL OF A DIALYSIS  CATHETER N/A 05/05/2021   Procedure: MINOR REMOVAL OF A TUNNELED DIALYSIS CATHETER;  Surgeon: Oris Krystal FALCON, MD;  Location: [facility];  Service: Vascular;  Laterality: N/A;  . VENOUS ANGIOPLASTY  11/10/2023   Procedure: VENOUS ANGIOPLASTY;  Surgeon: Pearline Norman RAMAN, MD;  Location: [facility];  Service: Cardiovascular;;  innominate 80%    Family History  Problem Relation Age of Onset  . Breast cancer Mother        died 3  . Emphysema Father        died 64  . Diabetes Sister   . Diabetes Brother     Social History   Socioeconomic History  . Marital status: Married    Spouse name: Not on file  . Number of children: Not on file  . Years of education: Not on file  . Highest education level: Not on file  Occupational History  . Not on file  Tobacco Use  . Smoking status: Never  . Smokeless tobacco: Never  Vaping Use  . Vaping status: Never Used  Substance and Sexual Activity  . Alcohol use: Not Currently  . Drug use: Never  . Sexual activity: Not Currently  Other Topics Concern  . Not on file  Social History Narrative  . Not on file   Social Drivers of Health   Financial Resource Strain: Low Risk  (07/26/2023)   Overall Physicist, Medical Strain (  CARDIA)   . Difficulty of Paying Living Expenses: Not hard at all  Food Insecurity: No Food Insecurity (07/26/2023)   Hunger Vital Sign   . Worried About Programme Researcher, Broadcasting/film/video in the Last Year: Never true   . Ran Out of Food in the Last Year: Never true  Transportation Needs: No Transportation Needs (07/26/2023)   PRAPARE - Transportation   . Lack of Transportation (Medical): No   . Lack of Transportation (Non-Medical): No  Physical Activity: Patient Declined (07/26/2023)   Exercise Vital Sign   . Days of Exercise per Week: Patient declined   . Minutes of Exercise per Session: Patient declined  Stress: No Stress Concern Present (07/26/2023)   [employer] of Occupational Health - Occupational Stress Questionnaire   . Feeling of  Stress : Not at all  Social Connections: Moderately Isolated (07/26/2023)   Social Connection and Isolation Panel   . Frequency of Communication with Friends and Family: More than three times a week   . Frequency of Social Gatherings with Friends and Family: More than three times a week   . Attends Religious Services: Never   . Active Member of Clubs or Organizations: No   . Attends Banker Meetings: Never   . Marital Status: Married  Catering Manager Violence: Not At Risk (07/26/2023)   Humiliation, Afraid, Rape, and Kick questionnaire   . Fear of Current or Ex-Partner: No   . Emotionally Abused: No   . Physically Abused: No   . Sexually Abused: No    Outpatient Medications Prior to Visit  Medication Sig Dispense Refill  . albuterol  (VENTOLIN  HFA) 108 (90 Base) MCG/ACT inhaler Inhale 1-2 puffs into the lungs every 6 (six) hours as needed for wheezing or shortness of breath. 8 g 2  . atorvastatin  (LIPITOR ) 80 MG tablet Take 1 tablet (80 mg total) by mouth daily. 90 tablet 3  . budesonide -formoterol  (BREYNA ) 160-4.5 MCG/ACT inhaler Inhale 2 puffs into the lungs 2 (two) times daily.    . clopidogrel  (PLAVIX ) 75 MG tablet Take 1 tablet by mouth once daily 30 tablet 7  . metoprolol  succinate (TOPROL -XL) 25 MG 24 hr tablet TAKE 1 TABLET BY MOUTH IN THE MORNING AND AT BEDTIME 180 tablet 3  . midodrine (PROAMATINE) 5 MG tablet Take 5 mg by mouth every other day. Before HD    . nitroGLYCERIN  (NITROSTAT ) 0.4 MG SL tablet Place 1 tablet (0.4 mg total) under the tongue every 5 (five) minutes as needed for chest pain. 30 tablet 12  . pantoprazole  (PROTONIX ) 40 MG tablet Take 1 tablet (40 mg total) by mouth daily. 90 tablet 3  . sevelamer  (RENAGEL ) 800 MG tablet Take 800 mg by mouth 3 (three) times daily with meals.    . Tenapanor HCl, CKD, (XPHOZAH) 30 MG TABS Take 30 mg by mouth 2 (two) times daily.    . triamcinolone  cream (KENALOG ) 0.1 % Apply 1 Application topically 2 (two) times daily.  30 g 0   No facility-administered medications prior to visit.    No Known Allergies  Review of Systems     Objective:    Physical Exam  BP 118/80   Pulse 71   Ht 6' (1.829 m)   Wt 221 lb 12.8 oz (100.6 kg)   SpO2 95%   BMI 30.08 kg/m  Wt Readings from Last 3 Encounters:  03/13/24 221 lb 12.8 oz (100.6 kg)  01/03/24 221 lb 9.6 oz (100.5 kg)  07/26/23 245 lb (111.1 kg)  Assessment & Plan:   Problem List Items Addressed This Visit       Other   Early syphilis, latent - Primary   Relevant Medications   doxycycline  (VIBRA -TABS) 100 MG tablet     Meds ordered this encounter  Medications  . doxycycline  (VIBRA -TABS) 100 MG tablet    Sig: Take 1 tablet (100 mg total) by mouth 2 (two) times daily.    Dispense:  28 tablet    Refill:  0     Cyrah Mclamb MARLA Blanch, MD

## 2024-03-15 DIAGNOSIS — A530 Latent syphilis, unspecified as early or late: Secondary | ICD-10-CM | POA: Insufficient documentation

## 2024-03-15 NOTE — Assessment & Plan Note (Signed)
 Currently asymptomatic Started doxycycline  100 mg BID x 2 weeks Was unable to provide penicillin G due to shortage/backorder Contacted renal transplant clinic coordinator and informed about the provided treatment

## 2024-03-31 ENCOUNTER — Other Ambulatory Visit: Payer: Self-pay | Admitting: Physician Assistant

## 2024-03-31 DIAGNOSIS — I493 Ventricular premature depolarization: Secondary | ICD-10-CM

## 2024-04-03 ENCOUNTER — Ambulatory Visit: Admitting: Internal Medicine

## 2024-04-03 ENCOUNTER — Encounter: Payer: Self-pay | Admitting: Internal Medicine

## 2024-04-03 ENCOUNTER — Other Ambulatory Visit: Payer: Self-pay | Admitting: Internal Medicine

## 2024-04-03 ENCOUNTER — Telehealth: Payer: Self-pay | Admitting: Internal Medicine

## 2024-04-03 ENCOUNTER — Ambulatory Visit: Attending: Internal Medicine

## 2024-04-03 VITALS — BP 120/52 | HR 68 | Ht 72.0 in | Wt 223.0 lb

## 2024-04-03 DIAGNOSIS — Q249 Congenital malformation of heart, unspecified: Secondary | ICD-10-CM

## 2024-04-03 DIAGNOSIS — I5022 Chronic systolic (congestive) heart failure: Secondary | ICD-10-CM

## 2024-04-03 DIAGNOSIS — Z01810 Encounter for preprocedural cardiovascular examination: Secondary | ICD-10-CM

## 2024-04-03 DIAGNOSIS — I2510 Atherosclerotic heart disease of native coronary artery without angina pectoris: Secondary | ICD-10-CM

## 2024-04-03 DIAGNOSIS — Z7902 Long term (current) use of antithrombotics/antiplatelets: Secondary | ICD-10-CM | POA: Insufficient documentation

## 2024-04-03 DIAGNOSIS — I493 Ventricular premature depolarization: Secondary | ICD-10-CM

## 2024-04-03 MED ORDER — CLOPIDOGREL BISULFATE 75 MG PO TABS: 75.0000 mg | ORAL_TABLET | Freq: Every day | ORAL | 5 refills | Status: DC

## 2024-04-03 NOTE — Patient Instructions (Addendum)
 Medication Instructions:  Your physician recommends that you continue on your current medications as directed. Please refer to the Current Medication list given to you today.   Labwork: CBC and BMET to be completed on today at [facility]  Testing/Procedures:  [facility] OF [place] [address] [place] [address] Dept: [phone] Loc: [phone]  Nobel Brar  04/03/2024  You are scheduled for a Cardiac Catheterization on Wednesday, December 24 with Dr. Ozell Fell.  1. Please arrive at the [facility] (Main Entrance A) at [facility]: [address] [place], [place] [address] at 6:30 AM (This time is 2 hour(s) before your procedure to ensure your preparation).   Free valet parking service is available. You will check in at ADMITTING. The support person will be asked to wait in the waiting room.  It is OK to have someone drop you off and come back when you are ready to be discharged.    Special note: Every effort is made to have your procedure done on time. Please understand that emergencies sometimes delay scheduled procedures.  2. Diet: Nothing to eat after midnight.   3. Hydration: You need to be well hydrated before your procedure. On December 24, you may drink approved liquids (see below) until 2 hours before the procedure, with 16 oz of water as your last intake.   List of approved liquids water, clear juice, clear tea, black coffee, fruit juices, non-citric and without pulp, carbonated beverages, Gatorade, Kool -Aid, plain Jello-O and plain ice popsicles.  4. Labs: You will need to have blood drawn on Tuesday, December 16 at [facility]. You do not need to be fasting.  5. Medication instructions in preparation for your procedure:   Contrast Allergy: No   On the morning of your procedure, take your Aspirin  81 mg and any morning medicines NOT listed above.   You may use sips of water.  6. Plan to go home the same day, you will only stay overnight if medically necessary. 7. Bring a current list of your medications and current insurance cards. 8. You MUST have a responsible person to drive you home. 9. Someone MUST be with you the first 24 hours after you arrive home or your discharge will be delayed. 10. Please wear clothes that are easy to get on and off and wear slip-on shoes.  Thank you for allowing us  to care for you!   --  Invasive Cardiovascular services  ZIO- Long Term Monitor Instructions -Monitor will be Mailed to be placed on after Left Heart Cath Procedure  Your physician has requested you wear your ZIO patch monitor 14 days.   This is a single patch monitor.  [employer] supplies one patch monitor per enrollment.  Additional stickers are not available.   Please do not apply patch if you will be having a Nuclear Stress Test, Echocardiogram, Cardiac CT, MRI, or Chest Xray during the time frame you would be wearing the monitor. The patch cannot be worn during these tests.  You cannot remove and re-apply the ZIO XT patch monitor.   Your ZIO patch monitor will be sent USPS Priority mail from [facility] directly to your home address. The monitor may also be mailed to a PO BOX if home delivery is not available.   It may take 3-5 days to receive your monitor after you have been enrolled.   Once you have received  you monitor, please review enclosed instructions.  Your monitor has already been registered assigning a specific monitor serial # to you.   Applying the monitor   Shave hair from upper left chest.   Hold abrader disc by orange tab.  Rub abrader in 40 strokes over left upper chest as indicated in your monitor instructions.   Clean area with 4 enclosed alcohol pads .  Use all pads to assure are is cleaned thoroughly.  Let dry.   Apply patch as indicated in monitor instructions.  Patch will be place under  collarbone on left side of chest with arrow pointing upward.   Rub patch adhesive wings for 2 minutes.Remove white label marked 1.  Remove white label marked 2.  Rub patch adhesive wings for 2 additional minutes.   While looking in a mirror, press and release button in center of patch.  A small green light will flash 3-4 times .  This will be your only indicator the monitor has been turned on.     Do not shower for the first 24 hours.  You may shower after the first 24 hours.   Press button if you feel a symptom. You will hear a small click.  Record Date, Time and Symptom in the Patient Log Book.   When you are ready to remove patch, follow instructions on last 2 pages of Patient Log Book.  Stick patch monitor onto last page of Patient Log Book.   Place Patient Log Book in [place] box.  Use locking tab on box and tape box closed securely.  The [employer] has [employer] on it.  Please place in mailbox as soon as possible.  Your physician should have your test results approximately 7 days after the monitor has been mailed back to [facility].   Call [facility] Customer Care at [phone] if you have questions regarding your ZIO XT patch monitor.  Call them immediately if you see an orange light blinking on your monitor.   If your monitor falls off in less than 4 days contact our Monitor department at [phone].  If your monitor becomes loose or falls off after 4 days call [employer] at [phone] for suggestions on securing your monitor.   Follow-Up: Your physician recommends that you schedule a follow-up appointment in: 1 month after LHC  Any Other Special Instructions Will Be Listed Below (If Applicable). Thank you for choosing [facility]!     If you need a refill on your cardiac medications before your next appointment, please call your pharmacy.

## 2024-04-03 NOTE — H&P (View-Only) (Signed)
 Cardiology Office Note  Date: 04/03/2024   ID: [name], DOB [birth year], MRN [id number]  PCP:  Tobie Suzzane POUR, MD  Cardiologist:  Diannah SHAUNNA Maywood, MD Electrophysiologist:  None   History of Present Illness: [name] [name] is a 73 y.o. male known to have CAD s/p LAD PCI in 05/2021, HFimpEF (35 to 40% in 2022 improved to 55% in 2023), 11% PVC burden, MGUS, ESRD DD since 2022 (currently undergoing kidney transplant evaluation at [facility]) is here for follow-up visit.  Patient follows with kidney transplant team at [facility] in [place].  NM stress test was ordered by the team that showed no ischemia but LVEF was 36%.  Subsequent echocardiogram showed LVEF 50 to 55% and hence LHC was deferred.  He was cleared by pulmonology but later he was turned down for kidney transplantation at [facility]. SW at his dialysis unit referred him to [facility] for evaluation of kidney transportation.  He underwent NM stress test 11/22 at [facility] that showed small moderately severe, partially reversible perfusion defect involving the apical and apical lateral segments consistent with possible ischemia but cannot rule out artifact.  Nuclear LVEF was 58%.  Moderate to severe coronary artery calcifications were noted on attenuation CT scan.  He is here today for follow-up visit to determine if LHC is needed.  Patient was seen by renal transportation team at [facility] recently on 03/29/2024.  He denies having any angina or DOE.  No dizziness, syncope, palpitations, leg swelling.  He reported that he was told that he had abnormal heart rhythm when he underwent NM stress test.  I reviewed and discussed the NM stress findings with the patient today.  He had occasional PVCs on stress EKG.  He underwent event monitor in February 2024 that showed 11% PVC burden, currently on metoprolol .   Past Medical History:  Diagnosis Date   Asthma    CAD (coronary artery disease)    Chronic edema    Chronic HFrEF (heart failure with  reduced ejection fraction) (HCC)    Depression    Diabetes mellitus (HCC)    ESRD on hemodialysis (HCC)    MGUS (monoclonal gammopathy of unknown significance)    Obesity    prior weight loss    Past Surgical History:  Procedure Laterality Date   A/V SHUNT INTERVENTION Left 11/10/2023   Procedure: A/V SHUNT INTERVENTION;  Surgeon: Pearline Norman RAMAN, MD;  Location: [facility];  Service: Cardiovascular;  Laterality: Left;   ABCESS DRAINAGE     on back - possible spider bite   AV FISTULA PLACEMENT Left 12/23/2020   Procedure: LEFT ARM ARTERIOVENOUS (AV) FISTULA CREATION;  Surgeon: Oris Krystal FALCON, MD;  Location: [facility];  Service: Vascular;  Laterality: Left;   BASCILIC VEIN TRANSPOSITION Left 02/10/2021   Procedure: LEFT ARM SECOND STAGE BASILIC VEIN TRANSPOSITION;  Surgeon: Oris Krystal FALCON, MD;  Location: [facility];  Service: Vascular;  Laterality: Left;   COLONOSCOPY     CORONARY STENT INTERVENTION N/A 05/26/2021   Procedure: CORONARY STENT INTERVENTION;  Surgeon: Burnard Debby LABOR, MD;  Location: [facility] INVASIVE CV LAB;  Service: Cardiovascular;  Laterality: N/A;   INSERTION OF DIALYSIS CATHETER Right 12/23/2020   Procedure: INSERTION OF PALINDROME DIALYSIS CATHETER;  Surgeon: Oris Krystal FALCON, MD;  Location: [facility];  Service: Vascular;  Laterality: Right;   LEFT HEART CATH AND CORONARY ANGIOGRAPHY N/A 05/26/2021   Procedure: LEFT HEART CATH AND CORONARY ANGIOGRAPHY;  Surgeon: Burnard Debby LABOR, MD;  Location: [facility] INVASIVE [facility] LAB;  Service: Cardiovascular;  Laterality: N/A;   REMOVAL OF A DIALYSIS CATHETER N/A 05/05/2021   Procedure: MINOR REMOVAL OF A TUNNELED DIALYSIS CATHETER;  Surgeon: Oris Krystal FALCON, MD;  Location: [facility];  Service: Vascular;  Laterality: N/A;   VENOUS ANGIOPLASTY  11/10/2023   Procedure: VENOUS ANGIOPLASTY;  Surgeon: Pearline Norman RAMAN, MD;  Location: [facility];  Service: Cardiovascular;;  innominate 80%    Current Outpatient Medications  Medication Sig Dispense Refill   albuterol  (VENTOLIN   HFA) 108 (90 Base) MCG/ACT inhaler Inhale 1-2 puffs into the lungs every 6 (six) hours as needed for wheezing or shortness of breath. 8 g 2   atorvastatin  (LIPITOR ) 80 MG tablet Take 1 tablet (80 mg total) by mouth daily. 90 tablet 3   budesonide -formoterol  (BREYNA ) 160-4.5 MCG/ACT inhaler Inhale 2 puffs into the lungs 2 (two) times daily.     clopidogrel  (PLAVIX ) 75 MG tablet Take 1 tablet by mouth once daily 30 tablet 3   metoprolol  succinate (TOPROL -XL) 25 MG 24 hr tablet TAKE 1 TABLET BY MOUTH IN THE MORNING AND AT BEDTIME 180 tablet 3   midodrine (PROAMATINE) 5 MG tablet Take 5 mg by mouth every other day. Before HD     nitroGLYCERIN  (NITROSTAT ) 0.4 MG SL tablet Place 1 tablet (0.4 mg total) under the tongue every 5 (five) minutes as needed for chest pain. 30 tablet 12   pantoprazole  (PROTONIX ) 40 MG tablet Take 1 tablet (40 mg total) by mouth daily. 90 tablet 3   Tenapanor HCl, CKD, (XPHOZAH) 30 MG TABS Take 30 mg by mouth 2 (two) times daily.     triamcinolone  cream (KENALOG ) 0.1 % Apply 1 Application topically 2 (two) times daily. 30 g 0   doxycycline  (VIBRA -TABS) 100 MG tablet Take 1 tablet (100 mg total) by mouth 2 (two) times daily. (Patient not taking: Reported on 04/03/2024) 28 tablet 0   sevelamer  (RENAGEL ) 800 MG tablet Take 800 mg by mouth 3 (three) times daily with meals. (Patient not taking: Reported on 04/03/2024)     No current facility-administered medications for this visit.   Allergies:  Patient has no known allergies.   Social History: The patient  reports that he has never smoked. He has never used smokeless tobacco. He reports that he does not currently use alcohol. He reports that he does not use drugs.   Family History: The patient's family history includes Breast cancer in his mother; Diabetes in his brother and sister; Emphysema in his father.   ROS:  Please see the history of present illness. Otherwise, complete review of systems is positive for none.  All other  systems are reviewed and negative.   Physical Exam: VS:  BP (!) 120/52 (BP Location: Right Arm, Patient Position: Sitting, Cuff Size: Normal)   Pulse 68   Ht 6' (1.829 m)   Wt 223 lb (101.2 kg)   SpO2 97%   BMI 30.24 kg/m , BMI Body mass index is 30.24 kg/m.  Wt Readings from Last 3 Encounters:  04/03/24 223 lb (101.2 kg)  03/13/24 221 lb 12.8 oz (100.6 kg)  01/03/24 221 lb 9.6 oz (100.5 kg)    General: Patient appears comfortable at rest. HEENT: Conjunctiva and lids normal, oropharynx clear with moist mucosa. Neck: Supple, no elevated JVP or carotid bruits, no thyromegaly. Lungs: Clear to auscultation, nonlabored breathing at rest. Cardiac: Regular rate and rhythm, no S3 or significant systolic murmur, no pericardial rub. Abdomen: Soft, nontender, no hepatomegaly, bowel sounds present, no guarding or  rebound. Extremities: No pitting edema, distal pulses 2+. Skin: Warm and dry. Musculoskeletal: No kyphosis. Neuropsychiatric: Alert and oriented x3, affect grossly appropriate.  Recent Labwork: 05/30/2023: Hemoglobin 10.5; Potassium 4.4     Component Value Date/Time   CHOL 107 04/15/2023 0000   CHOL 145 06/24/2022 1359   TRIG 82 06/24/2022 1359   HDL 55 06/24/2022 1359   CHOLHDL 2.6 06/24/2022 1359   CHOLHDL 2.5 08/14/2021 1215   VLDL 17 08/14/2021 1215   LDLCALC 74 06/24/2022 1359     Assessment and Plan:  Preop cardiac risk stratification for kidney transplantation: Asymptomatic.  EKG today showed NSR, occasional PVCs.  NM stress test in 02/2024 showed possible ischemia in the LAD territory versus artifact.  Nuclear LVEF was 58%.  Echo in May 2024 within normal limits.  Despite absence of symptoms, I will schedule him for LHC as a part of preop evaluation for kidney transplantation, due to abnormal stress test.  Okay to stop Plavix  for 5 to 7 days before kidney transplantation. He is dialysis dependent, Monday Wednesday Friday.  Need to undergo the procedure on the day of  his dialysis.  Informed consent for LHC Risks and benefits of cardiac catheterization have been discussed with the patient.  These include bleeding, infection, kidney damage, stroke, heart attack, death.  The patient understands these risks and is willing to proceed.  Abnormal rhythm: Patient was told he had abnormal rhythm during his stenosis.  I reviewed and discussed his findings with the patient today.  He had occasional PVCs on the stress EKG.  He had 11% PVC burden in 2024.  Currently on metoprolol .  Obtain 2-week event monitor, live. Mail.  He will wear this after his cath procedure.  CAD s/p LAD PCI in 05/2021: Continue Plavix  monotherapy (due to residual CAD), atorvastatin  80 mg nightly.  Refused GLP-1 agonist in the past.  Lost significant amount of weight just with diet and exercise.  HLD, at goal: Continue atorvastatin  80 mg nightly, goal LDL less than 70.  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  Currently on midodrine for blood pressure issues.  ESRD DD: On midodrine 5 mg 3 times a week, follows with nephrology.  Type 2 diabetes mellitus: Not on medications after he lost a lot of weight.  Refill Plavix   30 minutes spent in reviewing prior records, more than 3 labs, discussion and documentation  Medication Adjustments/Labs and Tests Ordered: Current medicines are reviewed at length with the patient today.  Concerns regarding medicines are outlined above.    Disposition:  Follow up 1 month after LHC  Signed, Nicolette Gieske Arleta Maywood, MD, 04/03/2024 12:07 PM    [facility] at [facility] [address], [place], [place] [address]

## 2024-04-03 NOTE — Telephone Encounter (Signed)
 Checking percert on the following   LONG TERM MONITOR

## 2024-04-03 NOTE — Progress Notes (Signed)
 Cardiology Office Note  Date: 04/03/2024   ID: [name] [name], DOB [birth year], MRN [id number]  PCP:  Tobie Suzzane POUR, MD  Cardiologist:  Diannah SHAUNNA Maywood, MD Electrophysiologist:  None   History of Present Illness: [name] [name] is a 73 y.o. 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  Patient follows with kidney transplant team at [facility] in [place].  NM stress test was ordered by the team that showed no ischemia but LVEF was 36%.  Subsequent echocardiogram showed LVEF 50 to 55% and hence LHC was deferred.  He was cleared by pulmonology but later he was turned down for kidney transplantation at [facility]. SW at his dialysis unit referred him to [facility] for evaluation of kidney transportation.  He underwent NM stress test 11/22 at [facility] that showed small moderately severe, partially reversible perfusion defect involving the apical and apical lateral segments consistent with possible ischemia but cannot rule out artifact.  Nuclear LVEF was 58%.  Moderate to severe coronary artery calcifications were noted on attenuation CT scan.  He is here today for follow-up visit to determine if LHC is needed.  Patient was seen by renal transportation team at [facility] recently on 03/29/2024.  He denies having any angina or DOE.  No dizziness, syncope, palpitations, leg swelling.  He reported that he was told that he had abnormal heart rhythm when he underwent NM stress test.  I reviewed and discussed the NM stress findings with the patient today.  He had occasional PVCs on stress EKG.  He underwent event monitor in February 2024 that showed 11% PVC burden, currently on metoprolol .   Past Medical History:  Diagnosis Date   Asthma    CAD (coronary artery disease)    Chronic edema    Chronic HFrEF (heart failure with  reduced ejection fraction) (HCC)    Depression    Diabetes mellitus (HCC)    ESRD on hemodialysis (HCC)    MGUS (monoclonal gammopathy of unknown significance)    Obesity    prior weight loss    Past Surgical History:  Procedure Laterality Date   A/V SHUNT INTERVENTION Left 11/10/2023   Procedure: A/V SHUNT INTERVENTION;  Surgeon: Pearline Norman RAMAN, MD;  Location: [facility] PV [facility];  Service: Cardiovascular;  Laterality: Left;   ABCESS DRAINAGE     on back - possible spider bite   AV FISTULA PLACEMENT Left 12/23/2020   Procedure: LEFT ARM ARTERIOVENOUS (AV) FISTULA CREATION;  Surgeon: Oris Krystal FALCON, MD;  Location: [facility] ORS;  Service: Vascular;  Laterality: Left;   BASCILIC VEIN TRANSPOSITION Left 02/10/2021   Procedure: LEFT ARM SECOND STAGE BASILIC VEIN TRANSPOSITION;  Surgeon: Oris Krystal FALCON, MD;  Location: [facility] ORS;  Service: Vascular;  Laterality: Left;   COLONOSCOPY     CORONARY STENT INTERVENTION N/A 05/26/2021   Procedure: CORONARY STENT INTERVENTION;  Surgeon: Burnard Debby LABOR, MD;  Location: [facility] INVASIVE CV LAB;  Service: Cardiovascular;  Laterality: N/A;   INSERTION OF DIALYSIS CATHETER Right 12/23/2020   Procedure: INSERTION OF PALINDROME DIALYSIS CATHETER;  Surgeon: Oris Krystal FALCON, MD;  Location: [facility] OR[facility];  Service: Vascular;  Laterality: Right;   LEFT HEART CATH AND CORONARY ANGIOGRAPHY N/A 05/26/2021   Procedure: LEFT HEART CATH AND CORONARY ANGIOGRAPHY;  Surgeon: Burnard Debby LABOR, MD;  Location: [facility] INVASIVE CV LAB;  Service: Cardiovascular;  Laterality: N/A;   REMOVAL OF A DIALYSIS CATHETER N/A 05/05/2021   Procedure: MINOR REMOVAL OF A TUNNELED DIALYSIS CATHETER;  Surgeon: Oris Krystal FALCON, MD;  Location: [facility];  Service: Vascular;  Laterality: N/A;   VENOUS ANGIOPLASTY  11/10/2023   Procedure: VENOUS ANGIOPLASTY;  Surgeon: Pearline Norman RAMAN, MD;  Location: [facility];  Service: Cardiovascular;;  innominate 80%    Current Outpatient Medications  Medication Sig Dispense Refill   albuterol  (VENTOLIN   HFA) 108 (90 Base) MCG/ACT inhaler Inhale 1-2 puffs into the lungs every 6 (six) hours as needed for wheezing or shortness of breath. 8 g 2   atorvastatin  (LIPITOR ) 80 MG tablet Take 1 tablet (80 mg total) by mouth daily. 90 tablet 3   budesonide -formoterol  (BREYNA ) 160-4.5 MCG/ACT inhaler Inhale 2 puffs into the lungs 2 (two) times daily.     clopidogrel  (PLAVIX ) 75 MG tablet Take 1 tablet by mouth once daily 30 tablet 3   metoprolol  succinate (TOPROL -XL) 25 MG 24 hr tablet TAKE 1 TABLET BY MOUTH IN THE MORNING AND AT BEDTIME 180 tablet 3   midodrine (PROAMATINE) 5 MG tablet Take 5 mg by mouth every other day. Before HD     nitroGLYCERIN  (NITROSTAT ) 0.4 MG SL tablet Place 1 tablet (0.4 mg total) under the tongue every 5 (five) minutes as needed for chest pain. 30 tablet 12   pantoprazole  (PROTONIX ) 40 MG tablet Take 1 tablet (40 mg total) by mouth daily. 90 tablet 3   Tenapanor HCl, CKD, (XPHOZAH) 30 MG TABS Take 30 mg by mouth 2 (two) times daily.     triamcinolone  cream (KENALOG ) 0.1 % Apply 1 Application topically 2 (two) times daily. 30 g 0   doxycycline  (VIBRA -TABS) 100 MG tablet Take 1 tablet (100 mg total) by mouth 2 (two) times daily. (Patient not taking: Reported on 04/03/2024) 28 tablet 0   sevelamer  (RENAGEL ) 800 MG tablet Take 800 mg by mouth 3 (three) times daily with meals. (Patient not taking: Reported on 04/03/2024)     No current facility-administered medications for this visit.   Allergies:  Patient has no known allergies.   Social History: The patient  reports that he has never smoked. He has never used smokeless tobacco. He reports that he does not currently use alcohol. He reports that he does not use drugs.   Family History: The patient's family history includes Breast cancer in his mother; Diabetes in his brother and sister; Emphysema in his father.   ROS:  Please see the history of present illness. Otherwise, complete review of systems is positive for none.  All other  systems are reviewed and negative.   Physical Exam: VS:  BP (!) 120/52 (BP Location: Right Arm, Patient Position: Sitting, Cuff Size: Normal)   Pulse 68   Ht 6' (1.829 m)   Wt 223 lb (101.2 kg)   SpO2 97%   BMI 30.24 kg/m , BMI Body mass index is 30.24 kg/m.  Wt Readings from Last 3 Encounters:  04/03/24 223 lb (101.2 kg)  03/13/24 221 lb 12.8 oz (100.6 kg)  01/03/24 221 lb 9.6 oz (100.5 kg)    General: Patient appears comfortable at rest. HEENT: Conjunctiva and lids normal, oropharynx clear with moist mucosa. Neck: Supple, no elevated JVP or carotid bruits, no thyromegaly. Lungs: Clear to auscultation, nonlabored breathing at rest. Cardiac: Regular rate and rhythm, no S3 or significant systolic murmur, no pericardial rub. Abdomen: Soft, nontender, no hepatomegaly, bowel sounds present, no guarding or  rebound. Extremities: No pitting edema, distal pulses 2+. Skin: Warm and dry. Musculoskeletal: No kyphosis. Neuropsychiatric: Alert and oriented x3, affect grossly appropriate.  Recent Labwork: 05/30/2023: Hemoglobin 10.5; Potassium 4.4     Component Value Date/Time   CHOL 107 04/15/2023 0000   CHOL 145 06/24/2022 1359   TRIG 82 06/24/2022 1359   HDL 55 06/24/2022 1359   CHOLHDL 2.6 06/24/2022 1359   CHOLHDL 2.5 08/14/2021 1215   VLDL 17 08/14/2021 1215   LDLCALC 74 06/24/2022 1359     Assessment and Plan:  Preop cardiac risk stratification for kidney transplantation: Asymptomatic.  EKG today showed NSR, occasional PVCs.  NM stress test in 02/2024 showed possible ischemia in the LAD territory versus artifact.  Nuclear LVEF was 58%.  Echo in May 2024 within normal limits.  Despite absence of symptoms, I will schedule him for LHC as a part of preop evaluation for kidney transplantation, due to abnormal stress test.  Okay to stop Plavix  for 5 to 7 days before kidney transplantation. He is dialysis dependent, Monday Wednesday Friday.  Need to undergo the procedure on the day of  his dialysis.  Informed consent for LHC Risks and benefits of cardiac catheterization have been discussed with the patient.  These include bleeding, infection, kidney damage, stroke, heart attack, death.  The patient understands these risks and is willing to proceed.  Abnormal rhythm: Patient was told he had abnormal rhythm during his stenosis.  I reviewed and discussed his findings with the patient today.  He had occasional PVCs on the stress EKG.  He had 11% PVC burden in 2024.  Currently on metoprolol .  Obtain 2-week event monitor, live. Mail.  He will wear this after his cath procedure.  CAD s/p LAD PCI in 05/2021: Continue Plavix  monotherapy (due to residual CAD), atorvastatin  80 mg nightly.  Refused GLP-1 agonist in the past.  Lost significant amount of weight just with diet and exercise.  HLD, at goal: Continue atorvastatin  80 mg nightly, goal LDL less than 70.  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  Currently on midodrine for blood pressure issues.  ESRD DD: On midodrine 5 mg 3 times a week, follows with nephrology.  Type 2 diabetes mellitus: Not on medications after he lost a lot of weight.  Refill Plavix   30 minutes spent in reviewing prior records, more than 3 labs, discussion and documentation  Medication Adjustments/Labs and Tests Ordered: Current medicines are reviewed at length with the patient today.  Concerns regarding medicines are outlined above.    Disposition:  Follow up 1 month after LHC  Signed, Nicolette Gieske Arleta Maywood, MD, 04/03/2024 12:07 PM    [facility] at [facility] [address], [place], [place] [address]

## 2024-04-05 ENCOUNTER — Telehealth: Payer: Self-pay | Admitting: *Deleted

## 2024-04-05 ENCOUNTER — Telehealth: Payer: Self-pay | Admitting: Internal Medicine

## 2024-04-05 NOTE — Telephone Encounter (Signed)
 Patient stated he received his heart monitor today and wants to know if he should start wearing it today or wait until after his surgery.

## 2024-04-05 NOTE — Telephone Encounter (Signed)
 Dialysis schedule MWF-cath needs to be done on non-dialysis day. Cardiac Cath has been rescheduled to Tuesday April 17, 2024 and I reviewed instructions with patient.  Cardiac Catheterization scheduled at [facility] for: Tuesday April 17, 2024 9 AM Arrival time [facility] Main Entrance A at: 7 AM  Diet: -Nothing to eat after midnight.  Hydration: -May drink clear liquids until 2 hours before the procedure.  Approved liquids: Water, clear tea, black coffee, fruit juices-non-citric and without pulp,Gatorade, plain Jello/popsicles.  Medication instructions: -Usual morning medications can be taken including aspirin  81 mg and Plavix  75 mg.  Plan to go home the same day, you will only stay overnight if medically necessary.  You must have responsible adult to drive you home.  Someone must be with you the first 24 hours after you arrive home.

## 2024-04-06 NOTE — Telephone Encounter (Signed)
 Patient notified and verbalized understanding.

## 2024-04-17 ENCOUNTER — Ambulatory Visit (HOSPITAL_COMMUNITY)
Admission: RE | Admit: 2024-04-17 | Discharge: 2024-04-17 | Disposition: A | Discharge status: Home / Self Care | Attending: Cardiology | Admitting: Cardiology

## 2024-04-17 ENCOUNTER — Other Ambulatory Visit: Payer: Self-pay

## 2024-04-17 ENCOUNTER — Encounter (HOSPITAL_COMMUNITY)
Admission: RE | Disposition: A | Discharge status: Home / Self Care | Payer: Self-pay | Source: Home / Self Care | Attending: Cardiology

## 2024-04-17 DIAGNOSIS — Z79899 Other long term (current) drug therapy: Secondary | ICD-10-CM | POA: Insufficient documentation

## 2024-04-17 DIAGNOSIS — E1122 Type 2 diabetes mellitus with diabetic chronic kidney disease: Secondary | ICD-10-CM | POA: Insufficient documentation

## 2024-04-17 DIAGNOSIS — I2510 Atherosclerotic heart disease of native coronary artery without angina pectoris: Secondary | ICD-10-CM | POA: Insufficient documentation

## 2024-04-17 DIAGNOSIS — E785 Hyperlipidemia, unspecified: Secondary | ICD-10-CM | POA: Insufficient documentation

## 2024-04-17 DIAGNOSIS — Z7982 Long term (current) use of aspirin: Secondary | ICD-10-CM | POA: Insufficient documentation

## 2024-04-17 DIAGNOSIS — I5042 Chronic combined systolic (congestive) and diastolic (congestive) heart failure: Secondary | ICD-10-CM | POA: Insufficient documentation

## 2024-04-17 DIAGNOSIS — N186 End stage renal disease: Secondary | ICD-10-CM | POA: Insufficient documentation

## 2024-04-17 DIAGNOSIS — Z7902 Long term (current) use of antithrombotics/antiplatelets: Secondary | ICD-10-CM | POA: Insufficient documentation

## 2024-04-17 DIAGNOSIS — Z992 Dependence on renal dialysis: Secondary | ICD-10-CM | POA: Insufficient documentation

## 2024-04-17 DIAGNOSIS — Z955 Presence of coronary angioplasty implant and graft: Secondary | ICD-10-CM | POA: Insufficient documentation

## 2024-04-17 DIAGNOSIS — I493 Ventricular premature depolarization: Secondary | ICD-10-CM | POA: Insufficient documentation

## 2024-04-17 DIAGNOSIS — I2584 Coronary atherosclerosis due to calcified coronary lesion: Secondary | ICD-10-CM | POA: Insufficient documentation

## 2024-04-17 HISTORY — PX: LEFT HEART CATH AND CORONARY ANGIOGRAPHY: CATH118249

## 2024-04-17 SURGERY — LEFT HEART CATH AND CORONARY ANGIOGRAPHY
Anesthesia: LOCAL

## 2024-04-17 MED ORDER — LIDOCAINE HCL (PF) 1 % IJ SOLN
INTRAMUSCULAR | Status: AC
  Filled 2024-04-17: qty 30

## 2024-04-17 MED ORDER — SODIUM CHLORIDE 0.9 % IV SOLN: 250.0000 mL | INTRAVENOUS | Status: DC | PRN

## 2024-04-17 MED ORDER — FREE WATER
500.0000 mL | Freq: Once | Status: AC
  Administered 2024-04-17: 500 mL via ORAL

## 2024-04-17 MED ORDER — LIDOCAINE HCL (PF) 1 % IJ SOLN
INTRAMUSCULAR | Status: DC | PRN
  Administered 2024-04-17: 2 mL via INTRADERMAL

## 2024-04-17 MED ORDER — SODIUM CHLORIDE 0.9% FLUSH: 3.0000 mL | INTRAVENOUS | Status: DC | PRN

## 2024-04-17 MED ORDER — HEPARIN SODIUM (PORCINE) 1000 UNIT/ML IJ SOLN
INTRAMUSCULAR | Status: AC
  Filled 2024-04-17: qty 10

## 2024-04-17 MED ORDER — VERAPAMIL HCL 2.5 MG/ML IV SOLN
INTRAVENOUS | Status: AC
  Filled 2024-04-17: qty 2

## 2024-04-17 MED ORDER — IOHEXOL 350 MG/ML SOLN
INTRAVENOUS | Status: DC | PRN
  Administered 2024-04-17: 65 mL

## 2024-04-17 MED ORDER — HEPARIN SODIUM (PORCINE) 1000 UNIT/ML IJ SOLN
INTRAMUSCULAR | Status: DC | PRN
  Administered 2024-04-17: 5000 [IU] via INTRAVENOUS

## 2024-04-17 MED ORDER — FENTANYL CITRATE (PF) 100 MCG/2ML IJ SOLN
INTRAMUSCULAR | Status: AC
  Filled 2024-04-17: qty 2

## 2024-04-17 MED ORDER — FENTANYL CITRATE (PF) 100 MCG/2ML IJ SOLN
INTRAMUSCULAR | Status: DC | PRN
  Administered 2024-04-17: 25 ug via INTRAVENOUS

## 2024-04-17 MED ORDER — SODIUM CHLORIDE 0.9% FLUSH: 3.0000 mL | Freq: Two times a day (BID) | INTRAVENOUS | Status: DC

## 2024-04-17 MED ORDER — MIDAZOLAM HCL (PF) 2 MG/2ML IJ SOLN
INTRAMUSCULAR | Status: DC | PRN
  Administered 2024-04-17: 2 mg via INTRAVENOUS

## 2024-04-17 MED ORDER — ASPIRIN 81 MG PO CHEW
CHEWABLE_TABLET | ORAL | Status: AC
  Filled 2024-04-17: qty 1

## 2024-04-17 MED ORDER — ASPIRIN 81 MG PO CHEW
81.0000 mg | CHEWABLE_TABLET | ORAL | Status: AC
  Administered 2024-04-17: 81 mg via ORAL

## 2024-04-17 MED ORDER — HEPARIN (PORCINE) IN NACL 1000-0.9 UT/500ML-% IV SOLN
INTRAVENOUS | Status: DC | PRN
  Administered 2024-04-17 (×2): 500 mL

## 2024-04-17 MED ORDER — MIDAZOLAM HCL 2 MG/2ML IJ SOLN
INTRAMUSCULAR | Status: AC
  Filled 2024-04-17: qty 2

## 2024-04-17 MED ORDER — CLOPIDOGREL BISULFATE 75 MG PO TABS
75.0000 mg | ORAL_TABLET | Freq: Once | ORAL | Status: AC
  Administered 2024-04-17: 75 mg via ORAL
  Filled 2024-04-17: qty 1

## 2024-04-17 MED ORDER — VERAPAMIL HCL 2.5 MG/ML IV SOLN
INTRAVENOUS | Status: DC | PRN
  Administered 2024-04-17: 10 mL via INTRA_ARTERIAL

## 2024-04-17 SURGICAL SUPPLY — 13 items
CATH INFINITI 5 FR AR1 MOD (CATHETERS) IMPLANT
CATH INFINITI 5F JL4 125CM (CATHETERS) IMPLANT
CATH INFINITI MULTIPACK ANG 4F (CATHETERS) IMPLANT
DEVICE RAD COMP TR BAND LRG (VASCULAR PRODUCTS) IMPLANT
GUIDEWIRE INQWIRE 1.5J.035X260 (WIRE) IMPLANT
KIT SINGLE USE MANIFOLD (KITS) IMPLANT
PACK CARDIAC CATHETERIZATION (CUSTOM PROCEDURE TRAY) ×1 IMPLANT
SET ATX-X65L (MISCELLANEOUS) IMPLANT
SHEATH GLIDE SLENDER 4/5FR (SHEATH) IMPLANT
SHEATH PINNACLE 5F 10CM (SHEATH) IMPLANT
SHEATH PINNACLE 6F 10CM (SHEATH) IMPLANT
SHEATH PROBE COVER 6X72 (BAG) IMPLANT
WIRE MICRO SET SILHO 5FR 7 (SHEATH) IMPLANT

## 2024-04-17 NOTE — Progress Notes (Signed)
 TR band removed at 1150, gauze dressing applied. Right radial level 0, clean, dry, and intact.

## 2024-04-17 NOTE — Telephone Encounter (Signed)
 Per request [facility] called patient and no answer not able to leave a voicemail because mailbox full.

## 2024-04-17 NOTE — Interval H&P Note (Signed)
 History and Physical Interval Note:  04/17/2024 8:56 AM  [name] [name]  has presented today for surgery, with the diagnosis of abnormal stress test.  The various methods of treatment have been discussed with the patient and family. After consideration of risks, benefits and other options for treatment, the patient has consented to  Procedures: LEFT HEART CATH AND CORONARY ANGIOGRAPHY (N/A) and possible coronary angioplasty for intermediate abnormal nuclear stress test and for prekidney transplant evaluation as a surgical intervention.  The patient's history has been reviewed, patient examined, no change in status, stable for surgery.  I have reviewed the patient's chart and labs.  Questions were answered to the patient's satisfaction.     Gordy Bergamo

## 2024-04-17 NOTE — Progress Notes (Signed)
 Patient up to BR and positive for void before discharge. Also patient tolerated a regular diet without emesis. Patient discharged to home. Discharge instructions completed.

## 2024-04-17 NOTE — Discharge Instructions (Signed)

## 2024-04-18 ENCOUNTER — Encounter (HOSPITAL_COMMUNITY): Payer: Self-pay | Admitting: Cardiology

## 2024-04-18 ENCOUNTER — Emergency Department (HOSPITAL_COMMUNITY)

## 2024-04-18 ENCOUNTER — Inpatient Hospital Stay (HOSPITAL_COMMUNITY)
Admission: EM | Admit: 2024-04-18 | Discharge: 2024-04-24 | DRG: 919 | Disposition: A | Discharge status: Home / Self Care | Attending: Internal Medicine | Admitting: Internal Medicine

## 2024-04-18 ENCOUNTER — Other Ambulatory Visit: Payer: Self-pay

## 2024-04-18 DIAGNOSIS — Z7902 Long term (current) use of antithrombotics/antiplatelets: Secondary | ICD-10-CM

## 2024-04-18 DIAGNOSIS — Z955 Presence of coronary angioplasty implant and graft: Secondary | ICD-10-CM | POA: Diagnosis not present

## 2024-04-18 DIAGNOSIS — I493 Ventricular premature depolarization: Secondary | ICD-10-CM | POA: Diagnosis present

## 2024-04-18 DIAGNOSIS — I639 Cerebral infarction, unspecified: Principal | ICD-10-CM | POA: Diagnosis present

## 2024-04-18 DIAGNOSIS — Z7982 Long term (current) use of aspirin: Secondary | ICD-10-CM

## 2024-04-18 DIAGNOSIS — Y838 Other surgical procedures as the cause of abnormal reaction of the patient, or of later complication, without mention of misadventure at the time of the procedure: Secondary | ICD-10-CM | POA: Diagnosis present

## 2024-04-18 DIAGNOSIS — M898X9 Other specified disorders of bone, unspecified site: Secondary | ICD-10-CM | POA: Diagnosis present

## 2024-04-18 DIAGNOSIS — I25118 Atherosclerotic heart disease of native coronary artery with other forms of angina pectoris: Secondary | ICD-10-CM

## 2024-04-18 DIAGNOSIS — D631 Anemia in chronic kidney disease: Secondary | ICD-10-CM | POA: Diagnosis present

## 2024-04-18 DIAGNOSIS — T888XXA Other specified complications of surgical and medical care, not elsewhere classified, initial encounter: Secondary | ICD-10-CM | POA: Diagnosis present

## 2024-04-18 DIAGNOSIS — I953 Hypotension of hemodialysis: Secondary | ICD-10-CM | POA: Diagnosis present

## 2024-04-18 DIAGNOSIS — E1122 Type 2 diabetes mellitus with diabetic chronic kidney disease: Secondary | ICD-10-CM | POA: Diagnosis present

## 2024-04-18 DIAGNOSIS — E66811 Obesity, class 1: Secondary | ICD-10-CM | POA: Diagnosis present

## 2024-04-18 DIAGNOSIS — I5020 Unspecified systolic (congestive) heart failure: Secondary | ICD-10-CM | POA: Diagnosis present

## 2024-04-18 DIAGNOSIS — I2510 Atherosclerotic heart disease of native coronary artery without angina pectoris: Secondary | ICD-10-CM | POA: Diagnosis present

## 2024-04-18 DIAGNOSIS — Z79899 Other long term (current) drug therapy: Secondary | ICD-10-CM | POA: Diagnosis not present

## 2024-04-18 DIAGNOSIS — Z6830 Body mass index (BMI) 30.0-30.9, adult: Secondary | ICD-10-CM

## 2024-04-18 DIAGNOSIS — K219 Gastro-esophageal reflux disease without esophagitis: Secondary | ICD-10-CM

## 2024-04-18 DIAGNOSIS — D472 Monoclonal gammopathy: Secondary | ICD-10-CM | POA: Diagnosis present

## 2024-04-18 DIAGNOSIS — E042 Nontoxic multinodular goiter: Secondary | ICD-10-CM | POA: Diagnosis present

## 2024-04-18 DIAGNOSIS — G8191 Hemiplegia, unspecified affecting right dominant side: Secondary | ICD-10-CM | POA: Diagnosis present

## 2024-04-18 DIAGNOSIS — Z8673 Personal history of transient ischemic attack (TIA), and cerebral infarction without residual deficits: Secondary | ICD-10-CM | POA: Diagnosis not present

## 2024-04-18 DIAGNOSIS — N186 End stage renal disease: Secondary | ICD-10-CM | POA: Diagnosis present

## 2024-04-18 DIAGNOSIS — E785 Hyperlipidemia, unspecified: Secondary | ICD-10-CM | POA: Diagnosis present

## 2024-04-18 DIAGNOSIS — I6340 Cerebral infarction due to embolism of unspecified cerebral artery: Secondary | ICD-10-CM | POA: Diagnosis not present

## 2024-04-18 DIAGNOSIS — E669 Obesity, unspecified: Secondary | ICD-10-CM | POA: Diagnosis present

## 2024-04-18 DIAGNOSIS — Z992 Dependence on renal dialysis: Secondary | ICD-10-CM

## 2024-04-18 DIAGNOSIS — R531 Weakness: Secondary | ICD-10-CM

## 2024-04-18 DIAGNOSIS — Z833 Family history of diabetes mellitus: Secondary | ICD-10-CM | POA: Diagnosis not present

## 2024-04-18 DIAGNOSIS — I97821 Postprocedural cerebrovascular infarction during other surgery: Secondary | ICD-10-CM | POA: Diagnosis present

## 2024-04-18 DIAGNOSIS — I120 Hypertensive chronic kidney disease with stage 5 chronic kidney disease or end stage renal disease: Secondary | ICD-10-CM | POA: Diagnosis present

## 2024-04-18 DIAGNOSIS — I6389 Other cerebral infarction: Secondary | ICD-10-CM

## 2024-04-18 LAB — I-STAT CHEM 8, ED
BUN: 48 mg/dL — ABNORMAL HIGH (ref 8–23)
Calcium, Ion: 0.93 mmol/L — ABNORMAL LOW (ref 1.15–1.40)
Chloride: 103 mmol/L (ref 98–111)
Creatinine, Ser: 11.7 mg/dL — ABNORMAL HIGH (ref 0.61–1.24)
Glucose, Bld: 97 mg/dL (ref 70–99)
HCT: 37 % — ABNORMAL LOW (ref 39.0–52.0)
Hemoglobin: 12.6 g/dL — ABNORMAL LOW (ref 13.0–17.0)
Potassium: 4.1 mmol/L (ref 3.5–5.1)
Sodium: 139 mmol/L (ref 135–145)
TCO2: 24 mmol/L (ref 22–32)

## 2024-04-18 LAB — COMPREHENSIVE METABOLIC PANEL WITH GFR
ALT: 15 U/L (ref 0–44)
AST: 22 U/L (ref 15–41)
Albumin: 4.3 g/dL (ref 3.5–5.0)
Alkaline Phosphatase: 95 U/L (ref 38–126)
Anion gap: 22 — ABNORMAL HIGH (ref 5–15)
BUN: 49 mg/dL — ABNORMAL HIGH (ref 8–23)
CO2: 23 mmol/L (ref 22–32)
Calcium: 10 mg/dL (ref 8.9–10.3)
Chloride: 98 mmol/L (ref 98–111)
Creatinine, Ser: 10.6 mg/dL — ABNORMAL HIGH (ref 0.61–1.24)
GFR, Estimated: 5 mL/min — ABNORMAL LOW
Glucose, Bld: 95 mg/dL (ref 70–99)
Potassium: 4.3 mmol/L (ref 3.5–5.1)
Sodium: 143 mmol/L (ref 135–145)
Total Bilirubin: 0.5 mg/dL (ref 0.0–1.2)
Total Protein: 7.3 g/dL (ref 6.5–8.1)

## 2024-04-18 LAB — CBG MONITORING, ED: Glucose-Capillary: 79 mg/dL (ref 70–99)

## 2024-04-18 LAB — CBC
HCT: 39.4 % (ref 39.0–52.0)
Hemoglobin: 12.2 g/dL — ABNORMAL LOW (ref 13.0–17.0)
MCH: 29.1 pg (ref 26.0–34.0)
MCHC: 31 g/dL (ref 30.0–36.0)
MCV: 94 fL (ref 80.0–100.0)
Platelets: 97 K/uL — ABNORMAL LOW (ref 150–400)
RBC: 4.19 MIL/uL — ABNORMAL LOW (ref 4.22–5.81)
RDW: 14.2 % (ref 11.5–15.5)
WBC: 4.4 K/uL (ref 4.0–10.5)
nRBC: 0 % (ref 0.0–0.2)

## 2024-04-18 LAB — ETHANOL: Alcohol, Ethyl (B): <15 mg/dL

## 2024-04-18 MED ORDER — CLOPIDOGREL BISULFATE 75 MG PO TABS
300.0000 mg | ORAL_TABLET | ORAL | Status: AC
  Administered 2024-04-18: 300 mg via ORAL
  Filled 2024-04-18: qty 4

## 2024-04-18 MED ORDER — CLOPIDOGREL BISULFATE 75 MG PO TABS
75.0000 mg | ORAL_TABLET | Freq: Every day | ORAL | Status: DC
  Administered 2024-04-19 – 2024-04-24 (×6): 75 mg via ORAL
  Filled 2024-04-18 (×7): qty 1

## 2024-04-18 MED ORDER — ATORVASTATIN CALCIUM 40 MG PO TABS
80.0000 mg | ORAL_TABLET | Freq: Every day | ORAL | Status: DC
  Administered 2024-04-18 – 2024-04-24 (×7): 80 mg via ORAL
  Filled 2024-04-18 (×7): qty 2

## 2024-04-18 MED ORDER — NITROGLYCERIN 0.4 MG SL SUBL: 0.4000 mg | SUBLINGUAL_TABLET | SUBLINGUAL | Status: DC | PRN

## 2024-04-18 MED ORDER — IOHEXOL 350 MG/ML SOLN
75.0000 mL | Freq: Once | INTRAVENOUS | Status: AC | PRN
  Administered 2024-04-18: 75 mL via INTRAVENOUS

## 2024-04-18 MED ORDER — ASPIRIN 81 MG PO CHEW
324.0000 mg | CHEWABLE_TABLET | Freq: Once | ORAL | Status: AC
  Administered 2024-04-18: 324 mg via ORAL
  Filled 2024-04-18: qty 4

## 2024-04-18 MED ORDER — SODIUM CHLORIDE 0.9 % IV SOLN: INTRAVENOUS | Status: AC

## 2024-04-18 MED ORDER — FLUTICASONE FUROATE-VILANTEROL 100-25 MCG/ACT IN AEPB
1.0000 | INHALATION_SPRAY | Freq: Every day | RESPIRATORY_TRACT | Status: DC
  Administered 2024-04-19 – 2024-04-24 (×6): 1 via RESPIRATORY_TRACT
  Filled 2024-04-18: qty 28

## 2024-04-18 MED ORDER — ASPIRIN 81 MG PO TBEC
81.0000 mg | DELAYED_RELEASE_TABLET | Freq: Every day | ORAL | Status: DC
  Administered 2024-04-19 – 2024-04-24 (×6): 81 mg via ORAL
  Filled 2024-04-18 (×6): qty 1

## 2024-04-18 MED ORDER — ACETAMINOPHEN 325 MG PO TABS: 650.0000 mg | ORAL_TABLET | ORAL | Status: DC | PRN

## 2024-04-18 MED ORDER — SENNOSIDES-DOCUSATE SODIUM 8.6-50 MG PO TABS: 1.0000 | ORAL_TABLET | Freq: Every evening | ORAL | Status: DC | PRN

## 2024-04-18 MED ORDER — ACETAMINOPHEN 160 MG/5ML PO SOLN: 650.0000 mg | ORAL | Status: DC | PRN

## 2024-04-18 MED ORDER — ACETAMINOPHEN 650 MG RE SUPP: 650.0000 mg | RECTAL | Status: DC | PRN

## 2024-04-18 MED ORDER — STROKE: EARLY STAGES OF RECOVERY BOOK: Freq: Once | Status: AC

## 2024-04-18 MED ORDER — HEPARIN SODIUM (PORCINE) 5000 UNIT/ML IJ SOLN
5000.0000 [IU] | Freq: Three times a day (TID) | INTRAMUSCULAR | Status: DC
  Administered 2024-04-19 – 2024-04-24 (×14): 5000 [IU] via SUBCUTANEOUS
  Filled 2024-04-18 (×15): qty 1

## 2024-04-18 MED ORDER — PANTOPRAZOLE SODIUM 40 MG PO TBEC
40.0000 mg | DELAYED_RELEASE_TABLET | Freq: Every day | ORAL | Status: DC
  Administered 2024-04-19 – 2024-04-24 (×6): 40 mg via ORAL
  Filled 2024-04-18 (×6): qty 1

## 2024-04-18 NOTE — H&P (Signed)
 "                                                                                                          TRH H&P   Patient Demographics:    [name] [name], is a 73 y.o. male  MRN: [id number]   DOB - 08-28-1950  Admit Date - 04/18/2024  Outpatient Primary MD for the patient is Tobie Suzzane POUR, MD   Chief Complaint  Patient presents with   Extremity Weakness      HPI:    [name] [name]  is a 73 y.o. male,  with medical history significant of with history of asthma, CHF, depression, diabetes mellitus type 2, ESRD on hemodialysis Monday Wednesday Friday, CAD status post LAD PCI in 2023, chronic systolic CHF. - Patient followed by renal transplant plant team at [facility], and he was referred for cardiac clearance, he went for cardiac cath at [facility] 04/17/2024, which did show high risk CAD, with ostial/proximal LAD focal calcification 90% stenosis, with large OMI with ostial 90% stenosis, options were for PCI to LAD, and OM medical treatment, versus CABG. - Patient presents to ED today due to complaints of weakness, presents to ED with right-sided weakness, reports he finished his cardiac cath he felt some tingling numbness in the right upper arm which he thought initially secondary to his cardiac cath as it was accessed via right radial, but reports this morning he had significant weakness when he was standing up to go for dialysis and his right arm and right leg, unable to walk which prompted him to come to ED, reports some improvement in his movement currently. - in  ED, brain MRI was significant for acute CVA, so patient admitted for further workup.   Review of systems:      A full 10 point Review of Systems was done, except as stated above, all other Review of Systems were negative.   With Past History of the following :    Past Medical History:  Diagnosis Date   Asthma    CAD (coronary artery disease)    Chronic edema    Chronic HFrEF (heart failure with reduced ejection  fraction) (HCC)    Depression    Diabetes mellitus (HCC)    ESRD on hemodialysis (HCC)    MGUS (monoclonal gammopathy of unknown significance)    Obesity    prior weight loss      Past Surgical History:  Procedure Laterality Date   A/V SHUNT INTERVENTION Left 11/10/2023   Procedure: A/V SHUNT INTERVENTION;  Surgeon: Pearline Norman RAMAN, MD;  Location: [facility];  Service: Cardiovascular;  Laterality: Left;   ABCESS DRAINAGE     on back - possible spider bite   AV FISTULA PLACEMENT Left 12/23/2020   Procedure: LEFT ARM ARTERIOVENOUS (AV) FISTULA CREATION;  Surgeon: Oris Krystal FALCON, MD;  Location: [facility];  Service: Vascular;  Laterality: Left;   BASCILIC VEIN TRANSPOSITION Left 02/10/2021   Procedure: LEFT ARM SECOND STAGE BASILIC VEIN TRANSPOSITION;  Surgeon: Oris Krystal FALCON, MD;  Location: [facility];  Service: Vascular;  Laterality: Left;   COLONOSCOPY     CORONARY STENT INTERVENTION N/A 05/26/2021   Procedure: CORONARY STENT INTERVENTION;  Surgeon: Burnard Debby LABOR, MD;  Location: [facility];  Service: Cardiovascular;  Laterality: N/A;   INSERTION OF DIALYSIS CATHETER Right 12/23/2020   Procedure: INSERTION OF PALINDROME DIALYSIS CATHETER;  Surgeon: Oris Krystal FALCON, MD;  Location: [facility];  Service: Vascular;  Laterality: Right;   LEFT HEART CATH AND CORONARY ANGIOGRAPHY N/A 05/26/2021   Procedure: LEFT HEART CATH AND CORONARY ANGIOGRAPHY;  Surgeon: Burnard Debby LABOR, MD;  Location: [facility];  Service: Cardiovascular;  Laterality: N/A;   LEFT HEART CATH AND CORONARY ANGIOGRAPHY N/A 04/17/2024   Procedure: LEFT HEART CATH AND CORONARY ANGIOGRAPHY;  Surgeon: Ladona Heinz, MD;  Location: [facility];  Service: Cardiovascular;  Laterality: N/A;   REMOVAL OF A DIALYSIS CATHETER N/A 05/05/2021   Procedure: MINOR REMOVAL OF A TUNNELED DIALYSIS CATHETER;  Surgeon: Oris Krystal FALCON, MD;  Location: [facility];  Service: Vascular;  Laterality: N/A;   VENOUS ANGIOPLASTY  11/10/2023   Procedure: VENOUS  ANGIOPLASTY;  Surgeon: Pearline Norman RAMAN, MD;  Location: [facility];  Service: Cardiovascular;;  innominate 80%      Social History:     Social History   Tobacco Use   Smoking status: Never   Smokeless tobacco: Never  Substance Use Topics   Alcohol use: Not Currently        Family History :     Family History  Problem Relation Age of Onset   Breast cancer Mother        died 69   Emphysema Father        died 76   Diabetes Sister    Diabetes Brother      Home Medications:   Prior to Admission medications  Medication Sig Start Date End Date Taking? Authorizing Provider  albuterol  (VENTOLIN  HFA) 108 (90 Base) MCG/ACT inhaler Inhale 1-2 puffs into the lungs every 6 (six) hours as needed for wheezing or shortness of breath. 10/18/22   Tobie Suzzane POUR, MD  aspirin  EC 81 MG tablet Take 81 mg by mouth daily. Swallow whole.    [provider]  atorvastatin  (LIPITOR ) 80 MG tablet Take 1 tablet (80 mg total) by mouth daily. 06/28/23   Tobie Suzzane POUR, MD  budesonide -formoterol  (BREYNA ) 160-4.5 MCG/ACT inhaler Inhale 2 puffs into the lungs 2 (two) times daily.    [provider]  clopidogrel  (PLAVIX ) 75 MG tablet Take 1 tablet (75 mg total) by mouth daily. 04/03/24   Mallipeddi, Vishnu P, MD  metoprolol  succinate (TOPROL -XL) 25 MG 24 hr tablet TAKE 1 TABLET BY MOUTH IN THE MORNING AND AT BEDTIME 03/23/23   Dunn, Dayna N, PA-C  midodrine (PROAMATINE) 5 MG tablet Take 5 mg by mouth every other day. Before HD 11/24/22   [provider]  nitroGLYCERIN  (NITROSTAT ) 0.4 MG SL tablet Place 1 tablet (0.4 mg total) under the tongue every 5 (five) minutes as needed for chest pain. 05/27/21   Sherrill Cable Latif, DO  pantoprazole  (PROTONIX ) 40 MG tablet Take 1 tablet (40 mg total) by mouth daily. 06/28/23   Tobie Suzzane POUR, MD  Tenapanor HCl, CKD, (XPHOZAH) 30 MG TABS Take 30 mg by mouth 2 (two) times daily.    [provider]     Allergies:    Allergies[1]    Physical Exam:   Vitals  Blood pressure (!) 177/66, pulse 66, temperature 98.7  F (37.1 C), temperature source Oral, resp. rate 20, height 6' (1.829 m), weight 100.6 kg, SpO2 98%.   1. General Well-developed male, laying in bed, no apparent distress  2. Normal affect and insight, Not Suicidal or Homicidal, Awake Alert, Oriented X 3.  3. No F.N deficits, ALL C.Nerves Intact, patient with right upper extremity weakness 4/5, right lower extremity 4/5.    4. Ears and Eyes appear Normal, Conjunctivae clear, PERRLA. Moist Oral Mucosa.  5. Symmetrical Chest wall movement, Good air movement bilaterally, CTAB.  7. RRR, No Gallops, Rubs or Murmurs, No Parasternal Heave.  8. Positive Bowel Sounds, Abdomen Soft, No tenderness, No organomegaly appriciated,No rebound -guarding or rigidity.  9.  No Cyanosis, Normal Skin Turgor, No Skin Rash or Bruise.  10. joints appear normal , no effusion    Data Review:    CBC Recent Labs  Lab 04/18/24 1250 04/18/24 1257  WBC 4.4  --   HGB 12.2* 12.6*  HCT 39.4 37.0*  PLT 97*  --   MCV 94.0  --   MCH 29.1  --   MCHC 31.0  --   RDW 14.2  --    ------------------------------------------------------------------------------------------------------------------  Chemistries  Recent Labs  Lab 04/18/24 1250 04/18/24 1257  NA 143 139  K 4.3 4.1  CL 98 103  CO2 23  --   GLUCOSE 95 97  BUN 49* 48*  CREATININE 10.60* 11.70*  CALCIUM  10.0  --   AST 22  --   ALT 15  --   ALKPHOS 95  --   BILITOT 0.5  --    ------------------------------------------------------------------------------------------------------------------ estimated creatinine clearance is 6.8 mL/min (A) (by C-G formula based on SCr of 11.7 mg/dL (H)). ------------------------------------------------------------------------------------------------------------------ No results for input(s): TSH, T4TOTAL, T3FREE, THYROIDAB in the last 72 hours.  Invalid input(s):  FREET3  Coagulation profile No results for input(s): INR, PROTIME in the last 168 hours. ------------------------------------------------------------------------------------------------------------------- No results for input(s): DDIMER in the last 72 hours. -------------------------------------------------------------------------------------------------------------------  Cardiac Enzymes No results for input(s): CKMB, TROPONINI, MYOGLOBIN in the last 168 hours.  Invalid input(s): CK ------------------------------------------------------------------------------------------------------------------    Component Value Date/Time   BNP 262.0 (H) 01/13/2021 0837     ---------------------------------------------------------------------------------------------------------------  Urinalysis No results found for: COLORURINE, APPEARANCEUR, LABSPEC, PHURINE, GLUCOSEU, HGBUR, BILIRUBINUR, KETONESUR, PROTEINUR, UROBILINOGEN, NITRITE, LEUKOCYTESUR  ----------------------------------------------------------------------------------------------------------------   Imaging Results:    CT ANGIO HEAD NECK W WO CM Result Date: 04/18/2024 EXAM: CT HEAD WITHOUT CTA HEAD AND NECK WITH AND WITHOUT 04/18/2024 02:10:27 PM TECHNIQUE: CTA of the head and neck was performed with and without the administration of 75 mL of intravenous contrast (iohexol  (OMNIPAQUE ) 350 MG/ML injection 75 mL IOHEXOL  350 MG/ML SOLN). Noncontrast CT of the head with reconstructed 2-D images are also provided for review. Multiplanar 2D and/or 3D reformatted images are provided for review. Automated exposure control, iterative reconstruction, and/or weight based adjustment of the mA/kV was utilized to reduce the radiation dose to as low as reasonably achievable. COMPARISON: MRI head 04/18/2024 CLINICAL HISTORY: Neuro deficit, acute, stroke suspected; Right sided weakness. FINDINGS: CT HEAD: BRAIN AND  VENTRICLES: Subtle hypodensity in the posterior limb of the left internal capsule corresponds to an acute infarct on MRI. Hypodensities elsewhere in the cerebral white matter bilaterally are nonspecific but compatible with mild chronic small vessel ischemic disease. Small chronic infarcts are again noted in the left frontal and right occipital lobes as well as right thalamus and left caudate. No acute intracranial hemorrhage. No mass effect. No midline shift. No extra-axial fluid  collection. No hydrocephalus. ORBITS: No acute abnormality. SINUSES AND MASTOIDS: Small osteoma in a posterior left ethmoid air cell. Mild mucosal thickening in the paranasal sinuses. Trace bilateral mastoid fluid. CTA NECK: AORTIC ARCH AND ARCH VESSELS: Mild atherosclerotic calcification in the aortic arch and moderate calcification in the subclavian arteries. No dissection or arterial injury. No significant stenosis of the brachiocephalic or subclavian arteries. CERVICAL CAROTID ARTERIES: Moderately extensive, predominantly calcified plaque throughout the mid and distal common carotid arteries and proximal internal carotid arteries bilaterally. 50% stenosis of the distal right common carotid artery. No significant stenosis of the cervical ICAs. No dissection or arterial injury. CERVICAL VERTEBRAL ARTERIES: Patent and strongly dominant left vertebral artery with calcified plaque at its origin resulting in moderate to severe stenosis. Diminutive right vertebral artery with poor visualization of the proximal right V1 segment which may be severely narrowed or occluded. The right vertebral artery is patent from the distal V1 segment throughout the remainder of the neck but is diffusely small in caliber and mildly irregular. No dissection or arterial injury. LUNGS AND MEDIASTINUM: Unremarkable. SOFT TISSUES: Diffusely enlarged, heterogeneous, multinodular thyroid  gland including an approximately 4.3 cm nodule on the right. Mild substernal  extension of the thyroid . BONES: Moderate cervical spondylosis. CTA HEAD: ANTERIOR CIRCULATION: The intracranial internal carotid arteries are patent with relatively diffuse atherosclerotic calcification resulting in moderate right and mild left lacerum segment stenoses and up to moderate right cavernous segment stenosis. ACAs and MCAs are patent without evidence of a proximal branch occlusion or significant proximal stenosis. The right A1 segment is absent. No aneurysm. POSTERIOR CIRCULATION: The intracranial left vertebral artery is patent and dominant, supplying the basilar artery and with up to mild V4 stenosis due to calcified plaque. The intracranial right vertebral artery is patent and small with mild stenosis proximally and with the vessel terminating at PICA. The basilar artery is widely patent. There are large posterior communicating arteries bilaterally with hypoplasia of the P1 segments. Both PCAs are patent without evidence of a significant proximal stenosis. No aneurysm. OTHER: No dural venous sinus thrombosis on this non-dedicated study. IMPRESSION: 1. Known acute lacunar infarct in the left internal capsule. 2. Age advanced atherosclerosis in the head and neck without a large vessel occlusion in the anterior circulation. 3. 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 4. Dominant left vertebral artery with moderate to severe stenosis of its origin. 5. 50% stenosis of the distal right common carotid artery. 6. Moderate right and mild left intracranial ICA stenoses. 7. Approximately 4.3 cm right thyroid  nodule in a diffusely enlarged, heterogeneous multinodular thyroid  gland. Recommend non-emergent thyroid  ultrasound. Electronically signed by: Dasie Hamburg MD 04/18/2024 02:41 PM EST RP Workstation: [id number]   MR BRAIN WO CONTRAST Result Date: 04/18/2024 EXAM: MRI BRAIN WITHOUT CONTRAST 04/18/2024 01:58:23 PM TECHNIQUE: Multiplanar  multisequence MRI of the head/brain was performed without the administration of intravenous contrast. COMPARISON: None available. CLINICAL HISTORY: R sided ataxia. Right arm and leg weakness. FINDINGS: BRAIN AND VENTRICLES: There is a 1 cm acute infarct in the posterior limb of the left internal capsule. No intracranial hemorrhage, mass, midline shift, hydrocephalus, or extra-axial fluid collection is identified. Small chronic cortical infarcts are noted in the right occipital and high left frontal lobes. Scattered T2 hyperintensities in the cerebral white matter bilaterally are nonspecific but compatible with mild chronic small vessel ischemic disease. There are chronic lacunar infarcts in the pons, right thalamus, and left caudate nucleus. There is mild cerebral atrophy. Major intracranial  vascular flow voids are preserved. ORBITS: No acute abnormality. SINUSES AND MASTOIDS: Mild mucosal thickening in the paranasal sinuses. Trace bilateral mastoid fluid. BONES AND SOFT TISSUES: Normal marrow signal. Subcutaneous lipoma in the right suboccipital region. IMPRESSION: 1. Acute infarct in the posterior limb of the left internal capsule. 2. Chronic small vessel ischemic disease with small chronic infarcts as above. Electronically signed by: Dasie Hamburg MD 04/18/2024 02:22 PM EST RP Workstation: [id number]   CARDIAC CATHETERIZATION Result Date: 04/17/2024 Images from the original result were not included. Cardiac Catheterization 04/17/2024: Hemodynamic data: LV 130/36, EDP 14 mmHg.  Ao 138/73, mean 102 mmHg.  There is no pressure gradient across the aortic valve. Angiographic data: LM: Mildly calcified, widely patent. LAD: Severely calcified in the proximal segment.  There is a ostial proximal 90% stenosis followed by diffuse long segment 50% stenosis and a previously placed proximal to mid LAD stent (3.0 x 18 mm Onyx on 05/26/2021) has 30 to 40% ISR.  Mid to distal LAD has mild disease. LCx: Large vessel, gives origin  to large OM1 and large OM 3.  OM1 has ostial 90% stenosis. RCA: Large-caliber vessel and a dominant vessel.  Has anterior origin.  Has calcific diffuse proximal and ostial 30% stenosis. Impression and recommendations: Patient with high risk  CAD with ostial/proximal LAD focal calcific 90% stenosis and large OM1 with ostial 90% stenosis.  Options would be PCI to LAD and treat OM medically versus consider CABG, will discuss during Heart Team Meeting.   EKG showing sinus bradycardia at 58 bpm, QTc of 443.   Assessment & Plan:    Principal Problem:   Stroke (cerebrum) (HCC) Active Problems:   ESRD (end stage renal disease) (HCC)   Type 2 diabetes mellitus in patient with obesity (HCC)   Obesity (BMI 30-39.9)   Heart failure with improved ejection fraction (HFimpEF) (HCC)   HLD (hyperlipidemia)   Hemodialysis-associated hypotension    Acute CVA -MRI brain significant for acute infarct in the posterior limb of the left internal capsule.  -CTA head and neck significant for age advanced atherosclerosis in head and neck without large vessel occlusion -Under acute CVA pathway, -check A1c. - Check lipid panel - check 2D echo -Monitor on telemetry -Patient is on aspirin  and Plavix  at home already, he will received aspirin  load at 324 mg, and Plavix  load 3 mg while in ED, continue with aspirin  and Plavix  for now. - Will consult PT/OT/SLP. - Routine consult placed for neurology.  CAD - Patient with history of CAD, cardiac cath yesterday 04/17/2024 high risk - He denies any chest pain. - Continue with aspirin  and Plavix . - Continue with statin. -He is on beta-blockers, which I will hold for now to allow for permissive hypertension and due to bradycardia as wel.    Obesity (BMI 30-39.9) -Complicates overall prognosis and care   Diabetes mellitus type 2 in obese - Appears to be well-controlled with most recent A1c at 5.2 in September 2025 .  ESRD (end stage renal disease) (HCC) -  on HD  Monday Wednesday Friday, he missed hemodialysis today, discussed with renal they will dialyze tomorrow - He is being worked up for transplant at [facility]  4.3 cm right thyroid  nodule in a diffusely enlarged heterogeneous multinodular thyroid  gland.  -  Incidental findings on CTA head and neck, ideology recommend non-emergent thyroid  ultrasound DVT Prophylaxis Heparin   AM Labs Ordered, also please review Full Orders  Family Communication: Admission, patients condition and plan of care including tests  being ordered have been discussed with the patient and brother at bedside who indicate understanding and agree with the plan and Code Status.  Code Status full code  Likely DC to home  Consults called: Renal aware of the patient for HD tomorrow, routine inpatient consult for neurology placed, ED discussed with Dr. Matthews  Admission status: Inpatient  Time spent in minutes : 75 minutes   Brayton Lye M.D on 04/18/2024 at 7:25 PM   [facility] - Office  [phone]        [1] No Known Allergies  "

## 2024-04-18 NOTE — ED Notes (Signed)
 Transport called for pt to be sent to [id number].

## 2024-04-18 NOTE — ED Provider Notes (Signed)
 " [place] [facility] AT [facility] Provider Note   CSN: [id number] Arrival date & time: 04/18/24  1138     Patient presents with: Extremity Weakness   [name] [name] is a 73 y.o. male.   73 year old male history of CAD status post PCI on Plavix , CHF, and ESRD on dialysis who presents to the emergency department with right-sided weakness.  Last known well was 9 AM yesterday after going in for a heart catheterization.  Afterwards he reports that he was having difficulty moving his right arm and right leg.  Was unable to walk.  Says that since going home he is gotten some movement back but it seems like it is uncoordinated.  No headache or neck pain.  No other weakness or numbness.       Prior to Admission medications  Medication Sig Start Date End Date Taking? Authorizing Provider  albuterol  (VENTOLIN  HFA) 108 (90 Base) MCG/ACT inhaler Inhale 1-2 puffs into the lungs every 6 (six) hours as needed for wheezing or shortness of breath. 10/18/22   Tobie Suzzane POUR, MD  aspirin  EC 81 MG tablet Take 81 mg by mouth daily. Swallow whole.    [provider]  atorvastatin  (LIPITOR ) 80 MG tablet Take 1 tablet (80 mg total) by mouth daily. 06/28/23   Tobie Suzzane POUR, MD  budesonide -formoterol  (BREYNA ) 160-4.5 MCG/ACT inhaler Inhale 2 puffs into the lungs 2 (two) times daily.    [provider]  clopidogrel  (PLAVIX ) 75 MG tablet Take 1 tablet (75 mg total) by mouth daily. 04/03/24   Mallipeddi, Vishnu P, MD  metoprolol  succinate (TOPROL -XL) 25 MG 24 hr tablet TAKE 1 TABLET BY MOUTH IN THE MORNING AND AT BEDTIME 03/23/23   Dunn, Dayna N, PA-C  midodrine (PROAMATINE) 5 MG tablet Take 5 mg by mouth every other day. Before HD 11/24/22   [provider]  nitroGLYCERIN  (NITROSTAT ) 0.4 MG SL tablet Place 1 tablet (0.4 mg total) under the tongue every 5 (five) minutes as needed for chest pain. 05/27/21   Sherrill Cable Latif, DO  pantoprazole  (PROTONIX ) 40 MG tablet  Take 1 tablet (40 mg total) by mouth daily. 06/28/23   Tobie Suzzane POUR, MD  Tenapanor HCl, CKD, (XPHOZAH) 30 MG TABS Take 30 mg by mouth 2 (two) times daily.    [provider]    Allergies: Patient has no known allergies.    Review of Systems  Updated Vital Signs BP (!) 174/73   Pulse 79   Temp 98.1 F (36.7 C) (Oral)   Resp 15   Ht 6' (1.829 m)   Wt 100.6 kg   SpO2 97%   BMI 30.08 kg/m   Physical Exam Vitals and nursing note reviewed.  Constitutional:      General: He is not in acute distress.    Appearance: He is well-developed.  HENT:     Head: Normocephalic and atraumatic.     Right Ear: External ear normal.     Left Ear: External ear normal.     Nose: Nose normal.  Eyes:     Extraocular Movements: Extraocular movements intact.     Conjunctiva/sclera: Conjunctivae normal.     Pupils: Pupils are equal, round, and reactive to light.  Cardiovascular:     Comments: Radial pulse 2+ in right hand.  No bleeding or hematoma noted Pulmonary:     Effort: Pulmonary effort is normal.  Musculoskeletal:     Cervical back: Normal range of motion and neck supple.  Right lower leg: No edema.     Left lower leg: No edema.  Skin:    General: Skin is warm and dry.  Neurological:     Mental Status: He is alert.     Comments: NIHSS Exam  Level of Consciousness: Alert  LOC Questions: Answers Month and Age Correctly  LOC Commands: Opens and Closes Eyes and Hands on command  Best Gaze: Horizontal ocular movements intact  Visual Fields: No visual field loss  Facial Palsy: None  L Upper Extremity Motor: No drift after 10 seconds  R Upper Extremity Motor: No drift after 10 seconds.  4/5 strength against resistance L Lower extremity Motor: No drift after 5 seconds  R Lower extremity Motor: No drift after 5 seconds.  4/5 strength against resistance Ataxia: Present on right upper extremity and right lower extremity Sensory: Intact sensation to light touch on face, arms,  trunk, and legs bilaterally  Best Language: No aphasia  Dysarthria: No dysarthria  Neglect: No visual or sensory neglect     Psychiatric:        Mood and Affect: Mood normal.        Behavior: Behavior normal.     (all labs ordered are listed, but only abnormal results are displayed) Labs Reviewed  COMPREHENSIVE METABOLIC PANEL WITH GFR - Abnormal; Notable for the following components:      Result Value   BUN 49 (*)    Creatinine, Ser 10.60 (*)    GFR, Estimated 5 (*)    Anion gap 22 (*)    All other components within normal limits  CBC - Abnormal; Notable for the following components:   RBC 4.19 (*)    Hemoglobin 12.2 (*)    Platelets 97 (*)    All other components within normal limits  I-STAT CHEM 8, ED - Abnormal; Notable for the following components:   BUN 48 (*)    Creatinine, Ser 11.70 (*)    Calcium , Ion 0.93 (*)    Hemoglobin 12.6 (*)    HCT 37.0 (*)    All other components within normal limits  ETHANOL  URINALYSIS, ROUTINE W REFLEX MICROSCOPIC  CBG MONITORING, ED  CBG MONITORING, ED    EKG: EKG Interpretation Date/Time:  Wednesday April 18 2024 11:54:03 EST Ventricular Rate:  58 PR Interval:  198 QRS Duration:  102 QT Interval:  452 QTC Calculation: 443 R Axis:   84  Text Interpretation: Sinus bradycardia Cannot rule out Anterior infarct , age undetermined Abnormal ECG When compared with ECG of 03-Apr-2024 11:57, Premature ventricular complexes are no longer Present Confirmed by Yolande Charleston [phone]) on 04/18/2024 12:23:19 PM  Radiology: CT ANGIO HEAD NECK W WO CM Result Date: 04/18/2024 EXAM: CT HEAD WITHOUT CTA HEAD AND NECK WITH AND WITHOUT 04/18/2024 02:10:27 PM TECHNIQUE: CTA of the head and neck was performed with and without the administration of 75 mL of intravenous contrast (iohexol  (OMNIPAQUE ) 350 MG/ML injection 75 mL IOHEXOL  350 MG/ML SOLN). Noncontrast CT of the head with reconstructed 2-D images are also provided for review.  Multiplanar 2D and/or 3D reformatted images are provided for review. Automated exposure control, iterative reconstruction, and/or weight based adjustment of the mA/kV was utilized to reduce the radiation dose to as low as reasonably achievable. COMPARISON: MRI head 04/18/2024 CLINICAL HISTORY: Neuro deficit, acute, stroke suspected; Right sided weakness. FINDINGS: CT HEAD: BRAIN AND VENTRICLES: Subtle hypodensity in the posterior limb of the left internal capsule corresponds to an acute infarct on MRI. Hypodensities elsewhere in  the cerebral white matter bilaterally are nonspecific but compatible with mild chronic small vessel ischemic disease. Small chronic infarcts are again noted in the left frontal and right occipital lobes as well as right thalamus and left caudate. No acute intracranial hemorrhage. No mass effect. No midline shift. No extra-axial fluid collection. No hydrocephalus. ORBITS: No acute abnormality. SINUSES AND MASTOIDS: Small osteoma in a posterior left ethmoid air cell. Mild mucosal thickening in the paranasal sinuses. Trace bilateral mastoid fluid. CTA NECK: AORTIC ARCH AND ARCH VESSELS: Mild atherosclerotic calcification in the aortic arch and moderate calcification in the subclavian arteries. No dissection or arterial injury. No significant stenosis of the brachiocephalic or subclavian arteries. CERVICAL CAROTID ARTERIES: Moderately extensive, predominantly calcified plaque throughout the mid and distal common carotid arteries and proximal internal carotid arteries bilaterally. 50% stenosis of the distal right common carotid artery. No significant stenosis of the cervical ICAs. No dissection or arterial injury. CERVICAL VERTEBRAL ARTERIES: Patent and strongly dominant left vertebral artery with calcified plaque at its origin resulting in moderate to severe stenosis. Diminutive right vertebral artery with poor visualization of the proximal right V1 segment which may be severely narrowed or  occluded. The right vertebral artery is patent from the distal V1 segment throughout the remainder of the neck but is diffusely small in caliber and mildly irregular. No dissection or arterial injury. LUNGS AND MEDIASTINUM: Unremarkable. SOFT TISSUES: Diffusely enlarged, heterogeneous, multinodular thyroid  gland including an approximately 4.3 cm nodule on the right. Mild substernal extension of the thyroid . BONES: Moderate cervical spondylosis. CTA HEAD: ANTERIOR CIRCULATION: The intracranial internal carotid arteries are patent with relatively diffuse atherosclerotic calcification resulting in moderate right and mild left lacerum segment stenoses and up to moderate right cavernous segment stenosis. ACAs and MCAs are patent without evidence of a proximal branch occlusion or significant proximal stenosis. The right A1 segment is absent. No aneurysm. POSTERIOR CIRCULATION: The intracranial left vertebral artery is patent and dominant, supplying the basilar artery and with up to mild V4 stenosis due to calcified plaque. The intracranial right vertebral artery is patent and small with mild stenosis proximally and with the vessel terminating at PICA. The basilar artery is widely patent. There are large posterior communicating arteries bilaterally with hypoplasia of the P1 segments. Both PCAs are patent without evidence of a significant proximal stenosis. No aneurysm. OTHER: No dural venous sinus thrombosis on this non-dedicated study. IMPRESSION: 1. Known acute lacunar infarct in the left internal capsule. 2. Age advanced atherosclerosis in the head and neck without a large vessel occlusion in the anterior circulation. 3. 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 4. Dominant left vertebral artery with moderate to severe stenosis of its origin. 5. 50% stenosis of the distal right common carotid artery. 6. Moderate right and mild left intracranial ICA stenoses.  7. Approximately 4.3 cm right thyroid  nodule in a diffusely enlarged, heterogeneous multinodular thyroid  gland. Recommend non-emergent thyroid  ultrasound. Electronically signed by: Dasie Hamburg MD 04/18/2024 02:41 PM EST RP Workstation: [id number]   MR BRAIN WO CONTRAST Result Date: 04/18/2024 EXAM: MRI BRAIN WITHOUT CONTRAST 04/18/2024 01:58:23 PM TECHNIQUE: Multiplanar multisequence MRI of the head/brain was performed without the administration of intravenous contrast. COMPARISON: None available. CLINICAL HISTORY: R sided ataxia. Right arm and leg weakness. FINDINGS: BRAIN AND VENTRICLES: There is a 1 cm acute infarct in the posterior limb of the left internal capsule. No intracranial hemorrhage, mass, midline shift, hydrocephalus, or extra-axial fluid collection is identified. Small chronic cortical infarcts are  noted in the right occipital and high left frontal lobes. Scattered T2 hyperintensities in the cerebral white matter bilaterally are nonspecific but compatible with mild chronic small vessel ischemic disease. There are chronic lacunar infarcts in the pons, right thalamus, and left caudate nucleus. There is mild cerebral atrophy. Major intracranial vascular flow voids are preserved. ORBITS: No acute abnormality. SINUSES AND MASTOIDS: Mild mucosal thickening in the paranasal sinuses. Trace bilateral mastoid fluid. BONES AND SOFT TISSUES: Normal marrow signal. Subcutaneous lipoma in the right suboccipital region. IMPRESSION: 1. Acute infarct in the posterior limb of the left internal capsule. 2. Chronic small vessel ischemic disease with small chronic infarcts as above. Electronically signed by: Dasie Hamburg MD 04/18/2024 02:22 PM EST RP Workstation: [id number]   CARDIAC CATHETERIZATION Result Date: 04/17/2024 Images from the original result were not included. Cardiac Catheterization 04/17/2024: Hemodynamic data: LV 130/36, EDP 14 mmHg.  Ao 138/73, mean 102 mmHg.  There is no pressure gradient across  the aortic valve. Angiographic data: LM: Mildly calcified, widely patent. LAD: Severely calcified in the proximal segment.  There is a ostial proximal 90% stenosis followed by diffuse long segment 50% stenosis and a previously placed proximal to mid LAD stent (3.0 x 18 mm Onyx on 05/26/2021) has 30 to 40% ISR.  Mid to distal LAD has mild disease. LCx: Large vessel, gives origin to large OM1 and large OM 3.  OM1 has ostial 90% stenosis. RCA: Large-caliber vessel and a dominant vessel.  Has anterior origin.  Has calcific diffuse proximal and ostial 30% stenosis. Impression and recommendations: Patient with high risk  CAD with ostial/proximal LAD focal calcific 90% stenosis and large OM1 with ostial 90% stenosis.  Options would be PCI to LAD and treat OM medically versus consider CABG, will discuss during Heart Team Meeting.     Procedures   Medications Ordered in the ED  aspirin  chewable tablet 324 mg (has no administration in time range)  clopidogrel  (PLAVIX ) tablet 300 mg (has no administration in time range)  iohexol  (OMNIPAQUE ) 350 MG/ML injection 75 mL (75 mLs Intravenous Contrast Given 04/18/24 1354)    Clinical Course as of 04/18/24 1628  Wed Apr 18, 2024  1532 Dr Matthews from neurology consulted.  Recommends aspirin  and Plavix  and admission to [facility] [RP]  8394 Dr Virgel from hospitalist to admit the patient [RP]    Clinical Course User Index [RP] Yolande Lamar BROCKS, MD                                 Medical Decision Making Amount and/or Complexity of Data Reviewed Labs: ordered. Radiology: ordered.  Risk OTC drugs. Prescription drug management. Decision regarding hospitalization.   [name] [name] is a 73 year old male history of CAD status post PCI on Plavix , CHF, and ESRD on dialysis who presents to the emergency department with right-sided weakness.   Initial Ddx:  Stroke, ICH, embolism, dissection, hypoglycemia  MDM/Course:  Patient presents emergency department  right-sided weakness.  Last known well was 9 AM yesterday.  Reports after the heart catheterization he noticed that his right side was weak.  His NIH stroke screen is positive only for right-sided ataxia.  Does have some weakness against resistance on the right arm and right leg as well.  Given his last known well was out of the window for TNK.  There are no signs of an LVO currently.  He underwent an MRI that shows an internal capsule stroke  that is causing his symptoms.  CTA without any evidence of LVO but does have some mild atherosclerotic disease.  Discussed with neurology who feels that he can stay here and be admitted.  Given aspirin  and Plavix  and admitted to hospitalist.  Upon re-evaluation no new concerning symptoms.  This patient presents to the ED for concern of complaints listed in HPI, this involves an extensive number of treatment options, and is a complaint that carries with it a high risk of complications and morbidity. Disposition including potential need for admission considered.   Dispo: Admit to Floor  I have reviewed the patients home medications and made adjustments as needed Additional history obtained from family Records reviewed Outpatient Clinic Notes The following labs were independently interpreted: Chemistry and show CKD I independently reviewed the following imaging with scope of interpretation limited to determining acute life threatening conditions related to emergency care: CT Head and agree with the radiologist interpretation with the following exceptions: none I personally reviewed and interpreted cardiac monitoring: sinus bradycardia I personally reviewed and interpreted the pt's EKG: see above for interpretation  Consults: Hospitalist and neurology Social Determinants of health:  Geriatric  Portions of this note were generated with Scientist, clinical (histocompatibility and immunogenetics). Dictation errors may occur despite best attempts at proofreading.     Final diagnoses:   Cerebrovascular accident (CVA), unspecified mechanism (HCC)  Right sided weakness    ED Discharge Orders     None          Yolande Lamar BROCKS, MD 04/18/24 1628  "

## 2024-04-18 NOTE — ED Notes (Signed)
 Provider notified of SBP >200.

## 2024-04-18 NOTE — ED Notes (Signed)
EDP assessing pt in triage. 

## 2024-04-18 NOTE — ED Provider Notes (Signed)
 Patient has been on dialysis for years and does not need a creatinine prior to CTA.   Yolande Lamar BROCKS, MD 04/18/24 [phone]

## 2024-04-18 NOTE — ED Triage Notes (Signed)
 Pt went to have a procedure yesterday and as soon as he got off operation table he noticed right side arm and leg weakness noted. Pt stated LKW was 9am yesterday. Right arm and leg weakness noted.

## 2024-04-19 ENCOUNTER — Telehealth: Payer: Self-pay | Admitting: Cardiology

## 2024-04-19 ENCOUNTER — Inpatient Hospital Stay (HOSPITAL_COMMUNITY)

## 2024-04-19 ENCOUNTER — Other Ambulatory Visit (HOSPITAL_COMMUNITY): Payer: Self-pay | Admitting: *Deleted

## 2024-04-19 DIAGNOSIS — I6389 Other cerebral infarction: Secondary | ICD-10-CM

## 2024-04-19 DIAGNOSIS — I6340 Cerebral infarction due to embolism of unspecified cerebral artery: Secondary | ICD-10-CM

## 2024-04-19 DIAGNOSIS — I639 Cerebral infarction, unspecified: Secondary | ICD-10-CM | POA: Diagnosis not present

## 2024-04-19 LAB — CBC
HCT: 35.5 % — ABNORMAL LOW (ref 39.0–52.0)
Hemoglobin: 11.1 g/dL — ABNORMAL LOW (ref 13.0–17.0)
MCH: 29 pg (ref 26.0–34.0)
MCHC: 31.3 g/dL (ref 30.0–36.0)
MCV: 92.7 fL (ref 80.0–100.0)
Platelets: 178 K/uL (ref 150–400)
RBC: 3.83 MIL/uL — ABNORMAL LOW (ref 4.22–5.81)
RDW: 14.2 % (ref 11.5–15.5)
WBC: 4.5 K/uL (ref 4.0–10.5)
nRBC: 0 % (ref 0.0–0.2)

## 2024-04-19 LAB — LIPID PANEL
Cholesterol: 102 mg/dL (ref 0–200)
HDL: 45 mg/dL
LDL Cholesterol: 46 mg/dL (ref 0–99)
Total CHOL/HDL Ratio: 2.3 ratio
Triglycerides: 57 mg/dL
VLDL: 11 mg/dL (ref 0–40)

## 2024-04-19 LAB — HEPATITIS B SURFACE ANTIGEN: Hepatitis B Surface Ag: NONREACTIVE

## 2024-04-19 LAB — COMPREHENSIVE METABOLIC PANEL WITH GFR
ALT: 11 U/L (ref 0–44)
AST: 20 U/L (ref 15–41)
Albumin: 3.7 g/dL (ref 3.5–5.0)
Alkaline Phosphatase: 86 U/L (ref 38–126)
Anion gap: 16 — ABNORMAL HIGH (ref 5–15)
BUN: 53 mg/dL — ABNORMAL HIGH (ref 8–23)
CO2: 27 mmol/L (ref 22–32)
Calcium: 9.2 mg/dL (ref 8.9–10.3)
Chloride: 98 mmol/L (ref 98–111)
Creatinine, Ser: 11.6 mg/dL — ABNORMAL HIGH (ref 0.61–1.24)
GFR, Estimated: 4 mL/min — ABNORMAL LOW
Glucose, Bld: 71 mg/dL (ref 70–99)
Potassium: 4.6 mmol/L (ref 3.5–5.1)
Sodium: 140 mmol/L (ref 135–145)
Total Bilirubin: 0.5 mg/dL (ref 0.0–1.2)
Total Protein: 6 g/dL — ABNORMAL LOW (ref 6.5–8.1)

## 2024-04-19 LAB — PHOSPHORUS: Phosphorus: 8.4 mg/dL — ABNORMAL HIGH (ref 2.5–4.6)

## 2024-04-19 LAB — HEMOGLOBIN A1C
Hgb A1c MFr Bld: 5.9 % — ABNORMAL HIGH (ref 4.8–5.6)
Mean Plasma Glucose: 122.63 mg/dL

## 2024-04-19 MED ORDER — HEPARIN SODIUM (PORCINE) 1000 UNIT/ML DIALYSIS: 1000.0000 [IU] | INTRAMUSCULAR | Status: DC | PRN

## 2024-04-19 MED ORDER — ALTEPLASE 2 MG IJ SOLR: 2.0000 mg | Freq: Once | INTRAMUSCULAR | Status: DC | PRN

## 2024-04-19 MED ORDER — ANTICOAGULANT SODIUM CITRATE 4% (200MG/5ML) IV SOLN: 5.0000 mL | Status: DC | PRN

## 2024-04-19 MED ORDER — CHLORHEXIDINE GLUCONATE CLOTH 2 % EX PADS
6.0000 | MEDICATED_PAD | Freq: Every day | CUTANEOUS | Status: DC
  Administered 2024-04-19 – 2024-04-23 (×4): 6 via TOPICAL

## 2024-04-19 MED ORDER — PENTAFLUOROPROP-TETRAFLUOROETH EX AERO: 1.0000 | INHALATION_SPRAY | CUTANEOUS | Status: DC | PRN

## 2024-04-19 MED ORDER — LIDOCAINE-PRILOCAINE 2.5-2.5 % EX CREA: 1.0000 | TOPICAL_CREAM | CUTANEOUS | Status: DC | PRN

## 2024-04-19 MED ORDER — LIDOCAINE HCL (PF) 1 % IJ SOLN: 5.0000 mL | INTRAMUSCULAR | Status: DC | PRN

## 2024-04-19 NOTE — Evaluation (Signed)
 Occupational Therapy Evaluation Patient Details Name: [name] [name] MRN: [id number] DOB: 10/17/1950 Today's Date: 04/19/2024   History of Present Illness   [name] [name]  is a 74 y.o. male,  with medical history significant of with history of asthma, CHF, depression, diabetes mellitus type 2, ESRD on hemodialysis Monday Wednesday Friday, CAD status post LAD PCI in 2023, chronic systolic CHF.  - Patient followed by renal transplant plant team at [facility], and he was referred for cardiac clearance, he went for cardiac cath at [facility] 04/17/2024, which did show high risk CAD, with ostial/proximal LAD focal calcification 90% stenosis, with large OMI with ostial 90% stenosis, options were for PCI to LAD, and OM medical treatment, versus CABG.  - Patient presents to ED today due to complaints of weakness, presents to ED with right-sided weakness, reports he finished his cardiac cath he felt some tingling numbness in the right upper arm which he thought initially secondary to his cardiac cath as it was accessed via right radial, but reports this morning he had significant weakness when he was standing up to go for dialysis and his right arm and right leg, unable to walk which prompted him to come to ED, reports some improvement in his movement currently.  - in  ED, brain MRI was significant for acute CVA, so patient admitted for further workup. (per MD)     Clinical Impressions Pt agreeable to OT and PT co-evaluation. Pt is independent at baseline without AD. Today pt required CGA to min A for functional mobility with need of RW for balance. Pt demonstrates R UE weakness and decreased fine and gross motor coordination. Pt demonstrated labored effort to stand from the toilet with RW and need of min A. Significant fine motor delays and poor coordination for finger to nose test. The pt's medical and functional status supports admission to an inpatient rehabilitation program due to multifactorial deficits, continued  progress will require a coordinated, team-based approach with physician oversight to achieve meaningful gains in mobility, ADL independence, and safety. Pt left in the chair with call bell within reach. Pt will benefit from continued OT in the hospital to increase strength, balance, and endurance for safe ADL's.         If plan is discharge home, recommend the following:   A little help with walking and/or transfers;A little help with bathing/dressing/bathroom;Assistance with cooking/housework;Assist for transportation;Help with stairs or ramp for entrance     Functional Status Assessment   Patient has had a recent decline in their functional status and demonstrates the ability to make significant improvements in function in a reasonable and predictable amount of time.     Equipment Recommendations   None recommended by OT     Recommendations for Other Services   Rehab consult     Precautions/Restrictions   Precautions Precautions: Fall Recall of Precautions/Restrictions: Intact Restrictions Weight Bearing Restrictions Per Provider Order: No     Mobility Bed Mobility Overal bed mobility: Needs Assistance Bed Mobility: Supine to Sit     Supine to sit: Supervision, Contact guard     General bed mobility comments: No physical assist. Mild labored effort.    Transfers Overall transfer level: Needs assistance Equipment used: Rolling walker (2 wheels) Transfers: Sit to/from Stand, Bed to chair/wheelchair/BSC Sit to Stand: Contact guard assist, Min assist     Step pivot transfers: Min assist     General transfer comment: EOB to chair with RW; Initial STS without RW with pt showing more instability.  Balance Overall balance assessment: Needs assistance Sitting-balance support: No upper extremity supported, Feet supported Sitting balance-Leahy Scale: Good Sitting balance - Comments: seated at EOB   Standing balance support: No upper extremity  supported, During functional activity Standing balance-Leahy Scale: Poor Standing balance comment: poor to fair without RW; fair with RW                           ADL either performed or assessed with clinical judgement   ADL Overall ADL's : Needs assistance/impaired Eating/Feeding: Set up;Sitting   Grooming: Set up;Sitting   Upper Body Bathing: Set up;Sitting   Lower Body Bathing: Contact guard assist;Sitting/lateral leans   Upper Body Dressing : Set up;Sitting   Lower Body Dressing: Contact guard assist;Minimal assistance;Sitting/lateral leans   Toilet Transfer: Minimal assistance;Stand-pivot;Rolling walker (2 wheels) Toilet Transfer Details (indicate cue type and reason): EOB to chair with RW and ambulatory transfer to toilet; min A to stand from the toilet.  Toileting- Clothing Manipulation and Hygiene: Contact guard assist;Sitting/lateral lean   Tub/ Engineer, Structural: Minimal assistance;Stand-pivot;Rolling walker (2 wheels)   Functional mobility during ADLs: Contact guard assist;Minimal assistance;Rolling walker (2 wheels) General ADL Comments: Able to ambulate a short distance in the hall with RW which the pt does not use at baseline.     Vision Baseline Vision/History: 0 No visual deficits Ability to See in Adequate Light: 0 Adequate Patient Visual Report: No change from baseline Vision Assessment?: No apparent visual deficits     Perception Perception: Impaired Preception Impairment Details: Spatial orientation     Praxis Praxis: Not tested       Pertinent Vitals/Pain Pain Assessment Pain Assessment: No/denies pain     Extremity/Trunk Assessment Upper Extremity Assessment Upper Extremity Assessment: RUE deficits/detail;Right hand dominant RUE Deficits / Details: 4-/5 shoulder abduction; 4+/5 shoulder flexion; elbow flex/ext; wrist flex/ext; 4/5 gross grasp RUE Sensation: WNL RUE Coordination: decreased fine motor;decreased gross motor   Lower  Extremity Assessment Lower Extremity Assessment: Defer to PT evaluation   Cervical / Trunk Assessment Cervical / Trunk Assessment: Normal   Communication Communication Communication: No apparent difficulties   Cognition Arousal: Alert Behavior During Therapy: WFL for tasks assessed/performed Cognition: No apparent impairments                               Following commands: Intact       Cueing  General Comments   Cueing Techniques: Verbal cues  Pt left with overlay hands fine motor pad to use in the room.              Home Living Family/patient expects to be discharged to:: Private residence Living Arrangements: Spouse/significant other Available Help at Discharge: Family;Available 24 hours/day Type of Home: House Home Access: Level entry     Home Layout: Other (Comment) (2 steps to ktichen with rails on both sides; 1 step to den.)     [employer] Shower/Tub: Producer, Television/film/video: Standard Bathroom Accessibility: Yes How Accessible: Accessible via walker Home Equipment: Agricultural Consultant (2 wheels)          Prior Functioning/Environment Prior Level of Function : Independent/Modified Independent;Driving             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 ADLs Comments: Independent.    OT Problem List: Decreased strength;Decreased activity tolerance;Impaired balance (sitting and/or standing);Decreased coordination;Impaired UE functional use   OT Treatment/Interventions: Self-care/ADL training;Therapeutic  exercise;Neuromuscular education;Patient/family education;Balance training;Therapeutic activities;DME and/or AE instruction      OT Goals(Current goals can be found in the care plan section)   Acute Rehab OT Goals Patient Stated Goal: Improve function. OT Goal Formulation: With patient Time For Goal Achievement: 05/03/24 Potential to Achieve Goals: Good   OT Frequency:  Min 3X/week    Co-evaluation  PT/OT/SLP Co-Evaluation/Treatment: Yes Reason for Co-Treatment: To address functional/ADL transfers   OT goals addressed during session: ADL's and self-care                       End of Session Equipment Utilized During Treatment: Rolling walker (2 wheels);Gait belt  Activity Tolerance: Patient tolerated treatment well Patient left: in chair;with call bell/phone within reach  OT Visit Diagnosis: Unsteadiness on feet (R26.81);Other abnormalities of gait and mobility (R26.89);Muscle weakness (generalized) (M62.81);Other symptoms and signs involving the nervous system (M70.101)                Time: 9189-9156 OT Time Calculation (min): 33 min Charges:  OT General Charges $OT Visit: 1 Visit OT Evaluation $OT Eval Low Complexity: 1 Low  Cortlandt Capuano OT, MOT  Jayson Person 04/19/2024, 10:42 AM

## 2024-04-19 NOTE — Progress Notes (Signed)
 Reviewed the notes of recent hospitalization with right sided weakness postcardiac catheterization, unfortunately when I saw the patient post catheterization, he did not mention any weakness or any neurologic deficits.  Suspect difficulty with access to coronary arteries in view of tortuosity of the subclavian and innominate artery manipulation could have led to the stroke in the posterior circulation.  I also reviewed the coronary angiogram with Dr. Peter Jordan, it was felt that best option is to proceed with PCI to ostial LAD as there is a landing zone, will wait for 4 to 6 weeks post stroke, right groin to be utilized for PCI, we will schedule this in the outpatient basis after discussions and seeing the patient back in the office.  Will arrange for outpatient visit with me.     Gordy Bergamo, MD, [facility] 04/19/2024, 10:04 AM [facility] [address] [place], [place] [address] Phone: [phone]. Fax:  [phone]

## 2024-04-19 NOTE — Consult Note (Addendum)
 [facility]  INPATIENT CONSULTATION  Reason for Consultation: ESRD co management dialysis and assoc conditions Requesting Provider: Dr. Dino  HPI: [name] [name] is an 74 y.o. male with ESRD on HD, HFrEF, DM, MGUS, asthma currently admitted with CVA and nephrology is consulted for comanagement of ESRD and assoc conditions.  Presented [facility] ED with RUE and RLE weakness.  MRI showed acute CVA and he was admitted.  He had a [facility] via R radial artery which showed high risk lesions with cardiology to decide PCI vs CABG.    He was admitted for stroke w/u and PT/OT, neurology to see.   He missed HD in light of acute presentation yesterday and will have HD here today.   He said HD usually goes fine outpt.  No issues with LUE AVF.   PMH: Past Medical History:  Diagnosis Date   Asthma    CAD (coronary artery disease)    Chronic edema    Chronic HFrEF (heart failure with reduced ejection fraction) (HCC)    Depression    Diabetes mellitus (HCC)    ESRD on hemodialysis (HCC)    MGUS (monoclonal gammopathy of unknown significance)    Obesity    prior weight loss   PSH: Past Surgical History:  Procedure Laterality Date   A/V SHUNT INTERVENTION Left 11/10/2023   Procedure: A/V SHUNT INTERVENTION;  Surgeon: Pearline Norman RAMAN, MD;  Location: [facility];  Service: Cardiovascular;  Laterality: Left;   ABCESS DRAINAGE     on back - possible spider bite   AV FISTULA PLACEMENT Left 12/23/2020   Procedure: LEFT ARM ARTERIOVENOUS (AV) FISTULA CREATION;  Surgeon: Oris Krystal FALCON, MD;  Location: [facility];  Service: Vascular;  Laterality: Left;   BASCILIC VEIN TRANSPOSITION Left 02/10/2021   Procedure: LEFT ARM SECOND STAGE BASILIC VEIN TRANSPOSITION;  Surgeon: Oris Krystal FALCON, MD;  Location: [facility];  Service: Vascular;  Laterality: Left;   COLONOSCOPY     CORONARY STENT INTERVENTION N/A 05/26/2021   Procedure: CORONARY STENT INTERVENTION;  Surgeon: Burnard Debby LABOR, MD;  Location: [facility];  Service: Cardiovascular;  Laterality: N/A;   INSERTION OF DIALYSIS CATHETER Right 12/23/2020   Procedure: INSERTION OF PALINDROME DIALYSIS CATHETER;  Surgeon: Oris Krystal FALCON, MD;  Location: [facility];  Service: Vascular;  Laterality: Right;   LEFT HEART CATH AND CORONARY ANGIOGRAPHY N/A 05/26/2021   Procedure: LEFT HEART CATH AND CORONARY ANGIOGRAPHY;  Surgeon: Burnard Debby LABOR, MD;  Location: [facility];  Service: Cardiovascular;  Laterality: N/A;   LEFT HEART CATH AND CORONARY ANGIOGRAPHY N/A 04/17/2024   Procedure: LEFT HEART CATH AND CORONARY ANGIOGRAPHY;  Surgeon: Ladona Heinz, MD;  Location: [facility];  Service: Cardiovascular;  Laterality: N/A;   REMOVAL OF A DIALYSIS CATHETER N/A 05/05/2021   Procedure: MINOR REMOVAL OF A TUNNELED DIALYSIS CATHETER;  Surgeon: Oris Krystal FALCON, MD;  Location: [facility];  Service: Vascular;  Laterality: N/A;   VENOUS ANGIOPLASTY  11/10/2023   Procedure: VENOUS ANGIOPLASTY;  Surgeon: Pearline Norman RAMAN, MD;  Location: [facility];  Service: Cardiovascular;;  innominate 80%    Past Medical History:  Diagnosis Date   Asthma    CAD (coronary artery disease)    Chronic edema    Chronic HFrEF (heart failure with reduced ejection fraction) (HCC)    Depression    Diabetes mellitus (HCC)    ESRD on hemodialysis (HCC)    MGUS (monoclonal gammopathy of unknown significance)  Obesity    prior weight loss    Medications:  I have reviewed the patient's current medications.  Medications Prior to Admission  Medication Sig Dispense Refill   albuterol  (VENTOLIN  HFA) 108 (90 Base) MCG/ACT inhaler Inhale 1-2 puffs into the lungs every 6 (six) hours as needed for wheezing or shortness of breath. 8 g 2   aspirin  EC 81 MG tablet Take 81 mg by mouth daily. Swallow whole.     atorvastatin  (LIPITOR ) 80 MG tablet Take 1 tablet (80 mg total) by mouth daily. 90 tablet 3   budesonide -formoterol  (BREYNA ) 160-4.5 MCG/ACT inhaler Inhale 2 puffs into the lungs 2 (two)  times daily.     clopidogrel  (PLAVIX ) 75 MG tablet Take 1 tablet (75 mg total) by mouth daily. 30 tablet 5   metoprolol  succinate (TOPROL -XL) 25 MG 24 hr tablet TAKE 1 TABLET BY MOUTH IN THE MORNING AND AT BEDTIME 180 tablet 3   midodrine (PROAMATINE) 5 MG tablet Take 5 mg by mouth every other day. Before HD     nitroGLYCERIN  (NITROSTAT ) 0.4 MG SL tablet Place 1 tablet (0.4 mg total) under the tongue every 5 (five) minutes as needed for chest pain. 30 tablet 12   pantoprazole  (PROTONIX ) 40 MG tablet Take 1 tablet (40 mg total) by mouth daily. 90 tablet 3   Tenapanor HCl, CKD, (XPHOZAH) 30 MG TABS Take 30 mg by mouth 2 (two) times daily.      ALLERGIES:  Allergies[1]  FAM HX: Family History  Problem Relation Age of Onset   Breast cancer Mother        died 23   Emphysema Father        died 50   Diabetes Sister    Diabetes Brother     Social History:   reports that he has never smoked. He has never used smokeless tobacco. He reports that he does not currently use alcohol. He reports that he does not use drugs.  ROS: 12 system ROS neg except per HPI above  Blood pressure (!) 178/82, pulse 92, temperature 98.1 F (36.7 C), temperature source Oral, resp. rate 16, height 6' (1.829 m), weight 100.6 kg, SpO2 98%. PHYSICAL EXAM: Gen: well appearing sitting in chair  Eyes: EOMI ENT:MMM, dentures Neck: supple CV:  RRR Abd: soft Back:clear Extr:  no edema, LUE AVF +t/b Neuro: 4/5 strength on R arm and leg, clumsy but moving around    Results for orders placed or performed during the hospital encounter of 04/18/24 (from the past 48 hours)  CBG monitoring, ED     Status: None   Collection Time: 04/18/24 12:11 PM  Result Value Ref Range   Glucose-Capillary 79 70 - 99 mg/dL    Comment: Glucose reference range applies only to samples taken after fasting for at least 8 hours.  Comprehensive metabolic panel     Status: Abnormal   Collection Time: 04/18/24 12:50 PM  Result Value Ref  Range   Sodium 143 135 - 145 mmol/L    Comment: Electrolytes repeated to verify    Potassium 4.3 3.5 - 5.1 mmol/L   Chloride 98 98 - 111 mmol/L   CO2 23 22 - 32 mmol/L   Glucose, Bld 95 70 - 99 mg/dL    Comment: Glucose reference range applies only to samples taken after fasting for at least 8 hours.   BUN 49 (H) 8 - 23 mg/dL   Creatinine, Ser 89.39 (H) 0.61 - 1.24 mg/dL   Calcium  10.0 8.9 - 10.3  mg/dL   Total Protein 7.3 6.5 - 8.1 g/dL   Albumin 4.3 3.5 - 5.0 g/dL   AST 22 15 - 41 U/L   ALT 15 0 - 44 U/L   Alkaline Phosphatase 95 38 - 126 U/L   Total Bilirubin 0.5 0.0 - 1.2 mg/dL   GFR, Estimated 5 (L) >60 mL/min    Comment: (NOTE) Calculated using the CKD-EPI Creatinine Equation (2021)    Anion gap 22 (H) 5 - 15    Comment: Performed at [facility], [address] [place], [place] [address]  CBC     Status: Abnormal   Collection Time: 04/18/24 12:50 PM  Result Value Ref Range   WBC 4.4 4.0 - 10.5 K/uL   RBC 4.19 (L) 4.22 - 5.81 MIL/uL   Hemoglobin 12.2 (L) 13.0 - 17.0 g/dL   HCT 60.5 60.9 - 47.9 %   MCV 94.0 80.0 - 100.0 fL   MCH 29.1 26.0 - 34.0 pg   MCHC 31.0 30.0 - 36.0 g/dL   RDW 85.7 88.4 - 84.4 %   Platelets 97 (L) 150 - 400 K/uL    Comment: REPEATED TO VERIFY PLATELET COUNT CONFIRMED BY SMEAR Immature Platelet Fraction may be clinically indicated, consider ordering this additional test [id number]    nRBC 0.0 0.0 - 0.2 %    Comment: Performed at [facility], [address], [place], [place] [address]  Ethanol     Status: None   Collection Time: 04/18/24 12:50 PM  Result Value Ref Range   Alcohol, Ethyl (B) <15 <15 mg/dL    Comment: (NOTE) For medical purposes only. Performed at [facility], [address], [place], [place] [address]   Dozier brew 8, ED     Status: Abnormal   Collection Time: 04/18/24 12:57 PM  Result Value Ref Range   Sodium 139 135 - 145 mmol/L   Potassium 4.1 3.5 - 5.1 mmol/L   Chloride 103 98 - 111 mmol/L   BUN 48 (H) 8 - 23 mg/dL    Creatinine, Ser 88.29 (H) 0.61 - 1.24 mg/dL   Glucose, Bld 97 70 - 99 mg/dL    Comment: Glucose reference range applies only to samples taken after fasting for at least 8 hours.   Calcium , Ion 0.93 (L) 1.15 - 1.40 mmol/L   TCO2 24 22 - 32 mmol/L   Hemoglobin 12.6 (L) 13.0 - 17.0 g/dL   HCT 62.9 (L) 60.9 - 47.9 %  Lipid panel     Status: None   Collection Time: 04/19/24  5:16 AM  Result Value Ref Range   Cholesterol 102 0 - 200 mg/dL    Comment:        ATP III CLASSIFICATION:  <200     mg/dL   Desirable  799-760  mg/dL   Borderline High  >=759    mg/dL   High           Triglycerides 57 <150 mg/dL   HDL 45 >59 mg/dL   Total CHOL/HDL Ratio 2.3 RATIO   VLDL 11 0 - 40 mg/dL   LDL Cholesterol 46 0 - 99 mg/dL    Comment:        Total Cholesterol/HDL:CHD Risk Coronary Heart Disease Risk Table                     Men   Women  1/2 Average Risk   3.4   3.3  Average Risk       5.0  4.4  2 X Average Risk   9.6   7.1  3 X Average Risk  23.4   11.0        Use the calculated Patient Ratio above and the CHD Risk Table to determine the patient's CHD Risk.        ATP III CLASSIFICATION (LDL):  <100     mg/dL   Optimal  899-870  mg/dL   Near or Above                    Optimal  130-159  mg/dL   Borderline  839-810  mg/dL   High  >809     mg/dL   Very High Performed at [facility], [address], [place], [place] [address]   CBC     Status: Abnormal   Collection Time: 04/19/24  5:16 AM  Result Value Ref Range   WBC 4.5 4.0 - 10.5 K/uL   RBC 3.83 (L) 4.22 - 5.81 MIL/uL   Hemoglobin 11.1 (L) 13.0 - 17.0 g/dL   HCT 64.4 (L) 60.9 - 47.9 %   MCV 92.7 80.0 - 100.0 fL   MCH 29.0 26.0 - 34.0 pg   MCHC 31.3 30.0 - 36.0 g/dL   RDW 85.7 88.4 - 84.4 %   Platelets 178 150 - 400 K/uL   nRBC 0.0 0.0 - 0.2 %    Comment: Performed at [facility], [address], [place], [place] [address]  Comprehensive metabolic panel     Status: Abnormal   Collection Time: 04/19/24  5:16 AM  Result  Value Ref Range   Sodium 140 135 - 145 mmol/L   Potassium 4.6 3.5 - 5.1 mmol/L   Chloride 98 98 - 111 mmol/L   CO2 27 22 - 32 mmol/L   Glucose, Bld 71 70 - 99 mg/dL    Comment: Glucose reference range applies only to samples taken after fasting for at least 8 hours.   BUN 53 (H) 8 - 23 mg/dL   Creatinine, Ser 88.39 (H) 0.61 - 1.24 mg/dL   Calcium  9.2 8.9 - 10.3 mg/dL   Total Protein 6.0 (L) 6.5 - 8.1 g/dL   Albumin 3.7 3.5 - 5.0 g/dL   AST 20 15 - 41 U/L   ALT 11 0 - 44 U/L   Alkaline Phosphatase 86 38 - 126 U/L   Total Bilirubin 0.5 0.0 - 1.2 mg/dL   GFR, Estimated 4 (L) >60 mL/min    Comment: (NOTE) Calculated using the CKD-EPI Creatinine Equation (2021)    Anion gap 16 (H) 5 - 15    Comment: Performed at [facility], [address], [place], [place] [address]    CT ANGIO HEAD NECK W WO CM Result Date: 04/18/2024 EXAM: CT HEAD WITHOUT CTA HEAD AND NECK WITH AND WITHOUT 04/18/2024 02:10:27 PM TECHNIQUE: CTA of the head and neck was performed with and without the administration of 75 mL of intravenous contrast (iohexol  (OMNIPAQUE ) 350 MG/ML injection 75 mL IOHEXOL  350 MG/ML SOLN). Noncontrast CT of the head with reconstructed 2-D images are also provided for review. Multiplanar 2D and/or 3D reformatted images are provided for review. Automated exposure control, iterative reconstruction, and/or weight based adjustment of the mA/kV was utilized to reduce the radiation dose to as low as reasonably achievable. COMPARISON: MRI head 04/18/2024 CLINICAL HISTORY: Neuro deficit, acute, stroke suspected; Right sided weakness. FINDINGS: CT HEAD: BRAIN AND VENTRICLES: Subtle hypodensity in the posterior limb of the left internal capsule corresponds to  an acute infarct on MRI. Hypodensities elsewhere in the cerebral white matter bilaterally are nonspecific but compatible with mild chronic small vessel ischemic disease. Small chronic infarcts are again noted in the left frontal and right occipital lobes  as well as right thalamus and left caudate. No acute intracranial hemorrhage. No mass effect. No midline shift. No extra-axial fluid collection. No hydrocephalus. ORBITS: No acute abnormality. SINUSES AND MASTOIDS: Small osteoma in a posterior left ethmoid air cell. Mild mucosal thickening in the paranasal sinuses. Trace bilateral mastoid fluid. CTA NECK: AORTIC ARCH AND ARCH VESSELS: Mild atherosclerotic calcification in the aortic arch and moderate calcification in the subclavian arteries. No dissection or arterial injury. No significant stenosis of the brachiocephalic or subclavian arteries. CERVICAL CAROTID ARTERIES: Moderately extensive, predominantly calcified plaque throughout the mid and distal common carotid arteries and proximal internal carotid arteries bilaterally. 50% stenosis of the distal right common carotid artery. No significant stenosis of the cervical ICAs. No dissection or arterial injury. CERVICAL VERTEBRAL ARTERIES: Patent and strongly dominant left vertebral artery with calcified plaque at its origin resulting in moderate to severe stenosis. Diminutive right vertebral artery with poor visualization of the proximal right V1 segment which may be severely narrowed or occluded. The right vertebral artery is patent from the distal V1 segment throughout the remainder of the neck but is diffusely small in caliber and mildly irregular. No dissection or arterial injury. LUNGS AND MEDIASTINUM: Unremarkable. SOFT TISSUES: Diffusely enlarged, heterogeneous, multinodular thyroid  gland including an approximately 4.3 cm nodule on the right. Mild substernal extension of the thyroid . BONES: Moderate cervical spondylosis. CTA HEAD: ANTERIOR CIRCULATION: The intracranial internal carotid arteries are patent with relatively diffuse atherosclerotic calcification resulting in moderate right and mild left lacerum segment stenoses and up to moderate right cavernous segment stenosis. ACAs and MCAs are patent without  evidence of a proximal branch occlusion or significant proximal stenosis. The right A1 segment is absent. No aneurysm. POSTERIOR CIRCULATION: The intracranial left vertebral artery is patent and dominant, supplying the basilar artery and with up to mild V4 stenosis due to calcified plaque. The intracranial right vertebral artery is patent and small with mild stenosis proximally and with the vessel terminating at PICA. The basilar artery is widely patent. There are large posterior communicating arteries bilaterally with hypoplasia of the P1 segments. Both PCAs are patent without evidence of a significant proximal stenosis. No aneurysm. OTHER: No dural venous sinus thrombosis on this non-dedicated study. IMPRESSION: 1. Known acute lacunar infarct in the left internal capsule. 2. Age advanced atherosclerosis in the head and neck without a large vessel occlusion in the anterior circulation. 3. 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 4. Dominant left vertebral artery with moderate to severe stenosis of its origin. 5. 50% stenosis of the distal right common carotid artery. 6. Moderate right and mild left intracranial ICA stenoses. 7. Approximately 4.3 cm right thyroid  nodule in a diffusely enlarged, heterogeneous multinodular thyroid  gland. Recommend non-emergent thyroid  ultrasound. Electronically signed by: Dasie Hamburg MD 04/18/2024 02:41 PM EST RP Workstation: [id number]   MR BRAIN WO CONTRAST Result Date: 04/18/2024 EXAM: MRI BRAIN WITHOUT CONTRAST 04/18/2024 01:58:23 PM TECHNIQUE: Multiplanar multisequence MRI of the head/brain was performed without the administration of intravenous contrast. COMPARISON: None available. CLINICAL HISTORY: R sided ataxia. Right arm and leg weakness. FINDINGS: BRAIN AND VENTRICLES: There is a 1 cm acute infarct in the posterior limb of the left internal capsule. No intracranial hemorrhage, mass, midline shift,  hydrocephalus, or extra-axial  fluid collection is identified. Small chronic cortical infarcts are noted in the right occipital and high left frontal lobes. Scattered T2 hyperintensities in the cerebral white matter bilaterally are nonspecific but compatible with mild chronic small vessel ischemic disease. There are chronic lacunar infarcts in the pons, right thalamus, and left caudate nucleus. There is mild cerebral atrophy. Major intracranial vascular flow voids are preserved. ORBITS: No acute abnormality. SINUSES AND MASTOIDS: Mild mucosal thickening in the paranasal sinuses. Trace bilateral mastoid fluid. BONES AND SOFT TISSUES: Normal marrow signal. Subcutaneous lipoma in the right suboccipital region. IMPRESSION: 1. Acute infarct in the posterior limb of the left internal capsule. 2. Chronic small vessel ischemic disease with small chronic infarcts as above. Electronically signed by: Dasie Hamburg MD 04/18/2024 02:22 PM EST RP Workstation: [id number]    Assessment/PlanHarvey [name] is an 74 y.o. male with ESRD on HD, HFrEF, DM, MGUS, asthma currently admitted with CVA and nephrology is consulted for comanagement of ESRD and assoc conditions.  **Acute CVA: per primary and neurology.    **ESRD on HD MWF: missed yesterday due to CVA, will have HD here today then depending on DC date can have tomorrow or Sat then resume MWF next week.  Need outpt records when clinic open.   **HTN: meds being held in setting of acute CVA - primary managing   **Anemia: Hb 11s, CTM  **Metabolic bone disease: not on outpt binder, check phos. Corr ca normal.   **CAD: h/o PCI, LHC 12/30 showed lesions in need of intervention - PCI vs CABG being discussed by cardiology.  Will follow, reach out w concerns.  Manuelita DELENA Barters 04/19/2024, 9:34 AM       [1] No Known Allergies

## 2024-04-19 NOTE — Progress Notes (Signed)
*  PRELIMINARY RESULTS* Echocardiogram 2D Echocardiogram has been performed with saline bubble study.  [name] [name] 04/19/2024, 11:53 AM

## 2024-04-19 NOTE — Progress Notes (Addendum)
 Patient admitted with stoke.  HD patient. PT is recommending CIR. IPCM following.     04/19/24 1232  TOC Brief Assessment  Insurance and Status Reviewed  Patient has primary care physician Yes  Home environment has been reviewed Home with spouse  Prior level of function: needs assistance  Prior/Current Home Services No current home services  Social Drivers of Health Review SDOH reviewed no interventions necessary  Readmission risk has been reviewed Yes  Transition of care needs no transition of care needs at this time

## 2024-04-19 NOTE — Plan of Care (Signed)
" °  Problem: Acute Rehab OT Goals (only OT should resolve) Goal: Pt. Will Perform Grooming Flowsheets (Taken 04/19/2024 1046) Pt Will Perform Grooming:  with modified independence  standing Goal: Pt. Will Perform Lower Body Bathing Flowsheets (Taken 04/19/2024 1046) Pt Will Perform Lower Body Bathing: with modified independence Goal: Pt. Will Perform Upper Body Dressing Flowsheets (Taken 04/19/2024 1046) Pt Will Perform Upper Body Dressing: with modified independence Goal: Pt. Will Perform Lower Body Dressing Flowsheets (Taken 04/19/2024 1046) Pt Will Perform Lower Body Dressing: with modified independence Goal: Pt. Will Transfer To Toilet Flowsheets (Taken 04/19/2024 1046) Pt Will Transfer to Toilet:  with modified independence  ambulating Goal: Pt. Will Perform Toileting-Clothing Manipulation Flowsheets (Taken 04/19/2024 1046) Pt Will Perform Toileting - Clothing Manipulation and hygiene: with modified independence Goal: Pt/Caregiver Will Perform Home Exercise Program Flowsheets (Taken 04/19/2024 1046) Pt/caregiver will Perform Home Exercise Program:  Increased strength and coordination   Right Upper extremity  Independently  Milton Streicher OT, MOT  "

## 2024-04-19 NOTE — Evaluation (Signed)
 Speech Language Pathology Evaluation Patient Details Name: [name] [name] MRN: [id number] DOB: August 08, 1950 Today's Date: 04/19/2024 Time: 8767-8751 SLP Time Calculation (min) (ACUTE ONLY): 16 min  Problem List:  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   Essential hypertension 12/28/2022   Hemodialysis-associated hypotension 12/28/2022   Stasis dermatitis 12/28/2022   HLD (hyperlipidemia) 11/30/2022   Dependence on renal dialysis 06/24/2022   Encounter for general adult medical examination with abnormal findings 06/24/2022   Preop cardiovascular exam 06/24/2022   Heart failure with improved ejection fraction (HFimpEF) (HCC) 08/10/2021   Gastroesophageal reflux disease 06/26/2021   CAD (coronary artery disease) 06/23/2021   Obesity (BMI 30-39.9) 05/27/2021   Anemia of chronic disease 05/27/2021   ESRD (end stage renal disease) (HCC) 05/26/2021   Type 2 diabetes mellitus in patient with obesity (HCC) 05/26/2021   Adrenal mass 11/19/2020   Benign neoplasm of colon 11/27/2019   Asthma, chronic, mild persistent, uncomplicated 05/23/2018   DOE (dyspnea on exertion) 04/24/2018   Pulmonary embolism (HCC) 04/24/2018   Past Medical History:  Past Medical History:  Diagnosis Date   Asthma    CAD (coronary artery disease)    Chronic edema    Chronic HFrEF (heart failure with reduced ejection fraction) (HCC)    Depression    Diabetes mellitus (HCC)    ESRD on hemodialysis (HCC)    MGUS (monoclonal gammopathy of unknown significance)    Obesity    prior weight loss   Past Surgical History:  Past Surgical History:  Procedure Laterality Date   A/V SHUNT INTERVENTION Left 11/10/2023   Procedure: A/V SHUNT INTERVENTION;  Surgeon: Pearline Norman RAMAN, MD;  Location: [facility];  Service: Cardiovascular;  Laterality: Left;    ABCESS DRAINAGE     on back - possible spider bite   AV FISTULA PLACEMENT Left 12/23/2020   Procedure: LEFT ARM ARTERIOVENOUS (AV) FISTULA CREATION;  Surgeon: Oris Krystal FALCON, MD;  Location: [facility];  Service: Vascular;  Laterality: Left;   BASCILIC VEIN TRANSPOSITION Left 02/10/2021   Procedure: LEFT ARM SECOND STAGE BASILIC VEIN TRANSPOSITION;  Surgeon: Oris Krystal FALCON, MD;  Location: [facility];  Service: Vascular;  Laterality: Left;   COLONOSCOPY     CORONARY STENT INTERVENTION N/A 05/26/2021   Procedure: CORONARY STENT INTERVENTION;  Surgeon: Burnard Debby LABOR, MD;  Location: [facility];  Service: Cardiovascular;  Laterality: N/A;   INSERTION OF DIALYSIS CATHETER Right 12/23/2020   Procedure: INSERTION OF PALINDROME DIALYSIS CATHETER;  Surgeon: Oris Krystal FALCON, MD;  Location: [facility];  Service: Vascular;  Laterality: Right;   LEFT HEART CATH AND CORONARY ANGIOGRAPHY N/A 05/26/2021   Procedure: LEFT HEART CATH AND CORONARY ANGIOGRAPHY;  Surgeon: Burnard Debby LABOR, MD;  Location: [facility];  Service: Cardiovascular;  Laterality: N/A;   LEFT HEART CATH AND CORONARY ANGIOGRAPHY N/A 04/17/2024   Procedure: LEFT HEART CATH AND CORONARY ANGIOGRAPHY;  Surgeon: Ladona Heinz, MD;  Location: [facility];  Service: Cardiovascular;  Laterality: N/A;   REMOVAL OF A DIALYSIS CATHETER N/A 05/05/2021   Procedure: MINOR REMOVAL OF A TUNNELED DIALYSIS CATHETER;  Surgeon: Oris Krystal FALCON, MD;  Location: [facility];  Service: Vascular;  Laterality: N/A;   VENOUS ANGIOPLASTY  11/10/2023   Procedure: VENOUS ANGIOPLASTY;  Surgeon: Pearline Norman RAMAN, MD;  Location: [facility];  Service: Cardiovascular;;  innominate 80%  HPI:  [name] [name]  is a 74 y.o. male,  with medical history significant of with history of asthma, CHF, depression, diabetes mellitus type 2, ESRD on hemodialysis Monday Wednesday Friday, CAD status post LAD PCI in 2023, chronic systolic CHF.  - Patient followed by renal transplant plant team at [facility] with  recent cardiac cath at [facility] 04/17/2024, which did show high risk CAD. Pt presented to ED on 04/18/24 due to complaints of weakness, primarily right-sided weakness, reports he finished his cardiac cath he felt some tingling numbness in the right upper arm which he thought initially secondary to his cardiac cath as it was accessed via right radial, but reported significant weakness when he was standing up to go for dialysis and his right arm and right leg, unable to walk which prompted him to come to ED, reports some improvement in his movement currently.  MRI was significant for acute CVA posterior limb of left internal capsule.  ST consulted for speech/language cognitive assessment. Pt passed [facility] swallow screen.   Assessment / Plan / Recommendation Clinical Impression  Recommend f/u for ST/further cognitive assessment prn at next venue of care, potentially CIR; ST will s/o in acute setting as pt appears to exhibit adequate speech/language cognitive skills.  [name] [name] seen for speech/language cognitive assessment with speech being 100% intelligible within simple conversation with no dysarthria/apraxia observed.  Ox4 and able to state current deficits which appear physical in nature per report with R weakness in arm/leg, but pt able to indicate improvement and use of walker to A with walking.  Simple problem solving tasks and sustained attention tasks during meal appropriate with A requested for preparing meat for pt as he was unable to cut with knife/fork independently d/t R arm weakness.  Graphic expression task not attempted d/t R hand dominant weakness.  Reading adequate for environmental signs and simple information.  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  He denies any difficulty with processing information or verbal expression/word finding.  Recommend f/u at CIR prn to assess for further cognitive changes.  Thank you for this consult.     SLP Assessment   SLP Recommendation/Assessment: All further Speech Language Pathology needs can be addressed in the next venue of care SLP Visit Diagnosis: Cognitive communication deficit (R41.841)     Assistance Recommended at Discharge  Other (comment) (TBD)  Functional Status Assessment Patient has had a recent decline in their functional status and demonstrates the ability to make significant improvements in function in a reasonable and predictable amount of time.  Frequency and Duration  (evaluation only)         SLP Evaluation Cognition  Overall Cognitive Status: No family/caregiver present to determine baseline cognitive functioning Arousal/Alertness: Awake/alert Orientation Level: Oriented X4 Attention: Sustained Sustained Attention: Appears intact Memory: Appears intact Awareness: Appears intact Problem Solving: Appears intact (for simple tasks) Safety/Judgment: Appears intact Comments: Able to state he needed A with meal preparation, walker for A with walking/PT and OT for R arm weakness.       Comprehension  Auditory Comprehension Overall Auditory Comprehension: Appears within functional limits for tasks assessed Yes/No Questions: Within Functional Limits Commands: Within Functional Limits Conversation: Simple Visual Recognition/Discrimination Discrimination: Within Function Limits Reading Comprehension Reading Status: Within funtional limits (for simple information; environmental signs)    Expression Expression Primary Mode of Expression: Verbal Verbal Expression Overall Verbal Expression: Appears within functional limits for tasks assessed Level of Generative/Spontaneous Verbalization: [employer]  Naming: No impairment Pragmatics: No impairment Non-Verbal Means of Communication: Not applicable Written Expression Dominant Hand: Right Written Expression: Not tested (dominant hand (R) affected)   Oral / Motor  Oral Motor/Sensory Function Overall Oral Motor/Sensory Function:  Within functional limits Motor Speech Overall Motor Speech: Appears within functional limits for tasks assessed Respiration: Within functional limits Phonation: Normal Resonance: Within functional limits Articulation: Within functional limitis Intelligibility: Intelligible Motor Planning: Within functional limits Motor Speech Errors: Not applicable            Pat Oakland Fant,M.S.,CCC-SLP 04/19/2024, 1:00 PM

## 2024-04-19 NOTE — Progress Notes (Signed)
  Inpatient Rehab Admissions Coordinator :  Per therapy recommendations, patient was screened for CIR candidacy by Ottie Glazier RN MSN.  At this time patient appears to be a potential candidate for CIR. I will place a rehab consult per protocol for full assessment. Please call me with any questions.  Ottie Glazier RN MSN Admissions Coordinator [phone]

## 2024-04-19 NOTE — Consult Note (Signed)
 I connected with  [name] [name] on 04/19/2024 by a video enabled telemedicine application and verified that I am speaking with the correct person using two identifiers.   I discussed the limitations of evaluation and management by telemedicine. The patient expressed understanding and agreed to proceed.  Location of patient: [facility] Location of physician: [facility]  Neurology Consultation Reason for Consult: stroke Referring Physician: Dr Brayton Lye  CC: right side arm and leg weakness   History is obtained from: patient, chart review  HPI: [name] [name] is a 74 y.o. male with PMH of history of diabetes mellitus type 2, ESRD on  HD MWF, CAD s/p cardiac cath on 04/17/2024 who presented with right arm and leg weakness  Patient underwent left heart cath and coronary angio on 12?30/2025. Reportdely after waking up he noticed right sided weakness.   LKN 04/17/2024 prior to heart cath No tpa or thrombectomy as outside window and no LVO mRS 0 Event happened at hospital   ROS: All other systems reviewed and negative except as noted in the HPI.   Past Medical History:  Diagnosis Date   Asthma    CAD (coronary artery disease)    Chronic edema    Chronic HFrEF (heart failure with reduced ejection fraction) (HCC)    Depression    Diabetes mellitus (HCC)    ESRD on hemodialysis (HCC)    MGUS (monoclonal gammopathy of unknown significance)    Obesity    prior weight loss    Family History  Problem Relation Age of Onset   Breast cancer Mother        died 74   Emphysema Father        died 56   Diabetes Sister    Diabetes Brother     Social History:  reports that he has never smoked. He has never used smokeless tobacco. He reports that he does not currently use alcohol. He reports that he does not use drugs.  Medications Prior to Admission  Medication Sig Dispense Refill Last Dose/Taking   albuterol  (VENTOLIN  HFA) 108 (90 Base) MCG/ACT inhaler Inhale 1-2 puffs into  the lungs every 6 (six) hours as needed for wheezing or shortness of breath. 8 g 2 Past Month   aspirin  EC 81 MG tablet Take 81 mg by mouth daily. Swallow whole.   04/14/2024   atorvastatin  (LIPITOR ) 80 MG tablet Take 1 tablet (80 mg total) by mouth daily. 90 tablet 3 04/16/2024   budesonide -formoterol  (BREYNA ) 160-4.5 MCG/ACT inhaler Inhale 2 puffs into the lungs 2 (two) times daily.   Past Month   clopidogrel  (PLAVIX ) 75 MG tablet Take 1 tablet (75 mg total) by mouth daily. 30 tablet 5 04/16/2024   metoprolol  succinate (TOPROL -XL) 25 MG 24 hr tablet TAKE 1 TABLET BY MOUTH IN THE MORNING AND AT BEDTIME 180 tablet 3 04/16/2024   midodrine (PROAMATINE) 5 MG tablet Take 5 mg by mouth every other day. Before HD   04/16/2024   nitroGLYCERIN  (NITROSTAT ) 0.4 MG SL tablet Place 1 tablet (0.4 mg total) under the tongue every 5 (five) minutes as needed for chest pain. 30 tablet 12 Taking As Needed   pantoprazole  (PROTONIX ) 40 MG tablet Take 1 tablet (40 mg total) by mouth daily. 90 tablet 3 04/16/2024   Tenapanor HCl, CKD, (XPHOZAH) 30 MG TABS Take 30 mg by mouth 2 (two) times daily.   Past Week      Exam: Current vital signs: BP 96/79   Pulse (!) 53   Temp  97.7 F (36.5 C)   Resp 17   Ht 6' (1.829 m)   Wt 100.7 kg   SpO2 98%   BMI 30.11 kg/m  Vital signs in last 24 hours: Temp:  [98.1 F (36.7 C)-98.7 F (37.1 C)] 98.1 F (36.7 C) (01/01 0401) Pulse Rate:  [55-92] 92 (01/01 0401) Resp:  [14-22] 16 (01/01 0401) BP: (143-208)/(66-92) 178/82 (01/01 0401) SpO2:  [97 %-100 %] 98 % (01/01 0401)   Physical Exam : unable to do due to technical issues with video camera  I have reviewed labs in epic and the results pertinent to this consultation are: CBC:  Recent Labs  Lab 04/18/24 1250 04/18/24 1257 04/19/24 0516  WBC 4.4  --  4.5  HGB 12.2* 12.6* 11.1*  HCT 39.4 37.0* 35.5*  MCV 94.0  --  92.7  PLT 97*  --  178    Basic Metabolic Panel:  Lab Results  Component Value Date   NA  140 04/19/2024   K 4.6 04/19/2024   CO2 27 04/19/2024   GLUCOSE 71 04/19/2024   BUN 53 (H) 04/19/2024   CREATININE 11.60 (H) 04/19/2024   CALCIUM  9.2 04/19/2024   GFRNONAA 4 (L) 04/19/2024   Lipid Panel:  Lab Results  Component Value Date   LDLCALC 46 04/19/2024   HgbA1c:  Lab Results  Component Value Date   HGBA1C 5.9 (H) 04/18/2024   Urine Drug Screen: No results found for: LABOPIA, COCAINSCRNUR, LABBENZ, AMPHETMU, THCU, LABBARB  Alcohol Level     Component Value Date/Time   ETH <15 04/18/2024 1250     I have reviewed the images obtained:  CTH wo contrast 04/18/2024: Known acute lacunar infarct in the left internal capsule. Age advanced atherosclerosis in the head and neck without a large vessel occlusion in the anterior circulation.  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 Dominant left vertebral artery with moderate to severe stenosis of its origin. 50% stenosis of the distal right common carotid artery.  MRI Brain wo contrast 04/18/2024: Acute infarct in the posterior limb of the left internal capsule. Chronic small vessel ischemic disease with small chronic infarcts as above.  ASSESSMENT/PLAN: 74yo M with  right side arm and leg weakness .  Acute ischemic stroke - Etiology: likely cardioembolic  Recommendations: - Agree with continuing ASA 81mg  daily and Plavix  75mg  daily for 3 months ( or longer if needed from cardiac standpoint) and then continue ASA 81mg  daily - Atorva 80mg  daily - If TTE doesn't show thrombus, Ok to dc with outpatient neuro f/u in 3-4 weeks -PT/OT - Goal BP: gradual normotension - Discussed with hospitalist via secure chat  Thank you for allowing us  to participate in the care of this patient. If you have any further questions, please contact  me or neurohospitalist.   Arlin [facility]  neurohospitalist

## 2024-04-19 NOTE — Plan of Care (Signed)
" °  Problem: Acute Rehab PT Goals(only PT should resolve) Goal: Pt Will Go Supine/Side To Sit Outcome: Progressing Flowsheets (Taken 04/19/2024 1136) Pt will go Supine/Side to Sit: Independently Goal: Patient Will Transfer Sit To/From Stand Outcome: Progressing Flowsheets (Taken 04/19/2024 1136) Patient will transfer sit to/from stand: with supervision Goal: Pt Will Transfer Bed To Chair/Chair To Bed Outcome: Progressing Flowsheets (Taken 04/19/2024 1136) Pt will Transfer Bed to Chair/Chair to Bed: with supervision Goal: Pt Will Ambulate Outcome: Progressing Flowsheets (Taken 04/19/2024 1136) Pt will Ambulate:  with least restrictive assistive device  with supervision  50 feet Goal: Pt Will Go Up/Down Stairs Outcome: Progressing Flowsheets (Taken 04/19/2024 1136) Pt will Go Up / Down Stairs:  1-2 stairs  with rail(s)  with supervision   11:36 AM, 04/19/2024 Rosaria Settler, PT, DPT [place] with [facility]  "

## 2024-04-19 NOTE — Progress Notes (Signed)
 Pt arrived to unit for tx. Accessed pt's L AVF for tx Pt completed tx with no issues.   04/19/24 1945  Vitals  Temp 98.6 F (37 C)  Pulse Rate 64  Resp 15  BP (!) 169/71  SpO2 99 %  O2 Device Room Air  Weight 101.8 kg  Oxygen  Therapy  Patient Activity (if Appropriate) In bed  Pulse Oximetry Type Continuous  Oximetry Probe Site Changed No  Post Treatment  Dialyzer Clearance Lightly streaked  Hemodialysis Intake (mL) 0 mL  Liters Processed 63  Fluid Removed (mL) 2000 mL  Tolerated HD Treatment Yes  AVG/AVF Arterial Site Held (minutes) 5 minutes  AVG/AVF Venous Site Held (minutes) 5 minutes

## 2024-04-19 NOTE — Progress Notes (Signed)
 Pt returned from Dialysis in stable condition. 2 L removed and patient tolerated well.  HD access is covered with gauze and tape. Dressing is clean, dry and, and intact and no bleeding noted through dressing. Pt is alert and oriented. NIH is 0. Slight weakness noted to R arm and R leg. Pt reports weakness is better than yesterday. He sits up on side of bed to eat his dinner. No further needs at this time. [place] RN

## 2024-04-19 NOTE — Progress Notes (Addendum)
 " PROGRESS NOTE    [name] [name]  FMW:[id number] DOB: Nov 25, 1950 DOA: 04/18/2024 PCP: Tobie Suzzane POUR, MD   Brief Narrative:   74 y.o. male,  with medical history significant of with history of asthma, CHF, depression, diabetes mellitus type 2, ESRD on hemodialysis Monday Wednesday Friday, CAD status post LAD PCI in 2023, chronic systolic CHF. - Patient followed by renal transplant plant team at [facility], and he was referred for cardiac clearance, he went for cardiac cath at [facility] 04/17/2024, which did show high risk CAD, with ostial/proximal LAD focal calcification 90% stenosis, with large OMI with ostial 90% stenosis, options were for PCI to LAD, and OM medical treatment, versus CABG.  Patient presented to [facility] ED on 04/18/2025 due to complaints of weakness, presents to ED with right-sided weakness  Diagnosed with acute left internal capsule CVA.  Neurology consulted.  Nephrology consulted for ESRD management.  Assessment & Plan:  Principal Problem:   Stroke (cerebrum) (HCC) Active Problems:   ESRD (end stage renal disease) (HCC)   Type 2 diabetes mellitus in patient with obesity (HCC)   Obesity (BMI 30-39.9)   Heart failure with improved ejection fraction (HFimpEF) (HCC)   HLD (hyperlipidemia)   Hemodialysis-associated hypotension   Acute ischemic CVA,likely cardioembolic, POA: MRI of the brain showed evidence of acute infarct in the posterior limb of the left internal capsule. Was not a candidate for tPA as he recently had cardiac catheterization procedure done on 04/17/2024. - Continue with aspirin , Plavix  and statin. -Follow-up transthoracic echocardiogram - Continue to monitor on telemetry - PT/OT/speech therapy consultations placed - Discussed with Dr Shelton and recommendation is to continue with DAPT and statin and outpt neuro follow up.   Coronary artery disease status post stenting, POA: Prior history of stenting.  Cardiac catheterization procedure done  via right radial artery on 04/17/2024 showed high risk lesions.  Cardiologist will have to discuss whether to do another stenting procedure versus coronary artery bypass surgery. Continue with aspirin , statin and Plavix   Class I obesity: Dietary modifications and physical activity to facilitate weight loss. He may be a candidate for semaglutide (additional benefit in patients with CAD)  ESRD,POA: On HD  MWF -Nephrology consulted. -He will have HD today -Rest of the management as per nephrology  Right thyroid  nodule,POA: -Outpatient thyroid  ultrasound  Type 2 diabetes mellitus,POA: -Well controlled -On no medications    DVT prophylaxis: heparin  injection 5,000 Units Start: 04/19/24 0600     Code Status: Full Code Family Communication: None at the bedside Status is: Inpatient Remains inpatient appropriate because: Acute CVA    Subjective:  He feels that his strength in the right arm is better today.  We spoke about getting physical therapy and Occupational Therapy today as well as hemodialysis.  He says that he lives at home with his wife and is independent in activities of daily life.  He told me that he had cardiac catheterization procedure done at [facility] on 04/17/2024 and immediately felt weak afterwards.  He was told that the cardiologist will have to decide whether he needs another stenting procedure done versus coronary artery bypass surgery.  Examination:  General exam: Appears calm and comfortable  Respiratory system: Clear to auscultation. Respiratory effort normal. Cardiovascular system: S1 & S2 heard, RRR. No JVD, murmurs, rubs, gallops or clicks. No pedal edema. Gastrointestinal system: Abdomen is nondistended, soft and nontender. No organomegaly or masses felt. Normal bowel sounds heard. Central nervous system: Alert and oriented. No focal neurological deficits.  Extremities: Power 4/5 on the right arm and right leg, 5/5 on the left arm and left leg Skin: No  rashes, lesions or ulcers Psychiatry: Judgement and insight appear normal. Mood & affect appropriate.      Diet Orders (From admission, onward)     Start     Ordered   04/18/24 1827  Diet renal with fluid restriction Fluid restriction: 1200 mL Fluid; Room service appropriate? Yes; Fluid consistency: Thin  Diet effective now       Question Answer Comment  Fluid restriction: 1200 mL Fluid   Room service appropriate? Yes   Fluid consistency: Thin      04/18/24 1826            Objective: Vitals:   04/18/24 1801 04/18/24 1930 04/19/24 0005 04/19/24 0401  BP: (!) 177/66 (!) 179/85 (!) 143/73 (!) 178/82  Pulse: 66 (!) 58 (!) 55 92  Resp: 20 16 16 16   Temp: 98.7 F (37.1 C) 98.1 F (36.7 C) 98.1 F (36.7 C) 98.1 F (36.7 C)  TempSrc: Oral Oral Oral Oral  SpO2: 98% 99% 100% 98%  Weight:      Height:        Intake/Output Summary (Last 24 hours) at 04/19/2024 0830 Last data filed at 04/19/2024 0500 Gross per 24 hour  Intake 669.71 ml  Output --  Net 669.71 ml   Filed Weights   04/18/24 1149  Weight: 100.6 kg    Scheduled Meds:   stroke: early stages of recovery book   Does not apply Once   aspirin  EC  81 mg Oral Daily   atorvastatin   80 mg Oral Daily   Chlorhexidine  Gluconate Cloth  6 each Topical Q0600   clopidogrel   75 mg Oral Daily   fluticasone furoate-vilanterol  1 puff Inhalation Daily   heparin   5,000 Units Subcutaneous Q8H   pantoprazole   40 mg Oral Daily   Continuous Infusions:  sodium chloride  40 mL/hr at 04/18/24 1846    Nutritional status     Body mass index is 30.08 kg/m.  Data Reviewed:   CBC: Recent Labs  Lab 04/18/24 1250 04/18/24 1257 04/19/24 0516  WBC 4.4  --  4.5  HGB 12.2* 12.6* 11.1*  HCT 39.4 37.0* 35.5*  MCV 94.0  --  92.7  PLT 97*  --  178   Basic Metabolic Panel: Recent Labs  Lab 04/18/24 1250 04/18/24 1257 04/19/24 0516  NA 143 139 140  K 4.3 4.1 4.6  CL 98 103 98  CO2 23  --  27  GLUCOSE 95 97 71  BUN 49*  48* 53*  CREATININE 10.60* 11.70* 11.60*  CALCIUM  10.0  --  9.2   GFR: Estimated Creatinine Clearance: 6.9 mL/min (A) (by C-G formula based on SCr of 11.6 mg/dL (H)). Liver Function Tests: Recent Labs  Lab 04/18/24 1250 04/19/24 0516  AST 22 20  ALT 15 11  ALKPHOS 95 86  BILITOT 0.5 0.5  PROT 7.3 6.0*  ALBUMIN 4.3 3.7   No results for input(s): LIPASE, AMYLASE in the last 168 hours. No results for input(s): AMMONIA in the last 168 hours. Coagulation Profile: No results for input(s): INR, PROTIME in the last 168 hours. Cardiac Enzymes: No results for input(s): CKTOTAL, CKMB, CKMBINDEX, TROPONINI in the last 168 hours. BNP (last 3 results) No results for input(s): PROBNP in the last 8760 hours. HbA1C: No results for input(s): HGBA1C in the last 72 hours. CBG: Recent Labs  Lab 04/18/24 1211  GLUCAP 79  Lipid Profile: Recent Labs    04/19/24 0516  CHOL 102  HDL 45  LDLCALC 46  TRIG 57  CHOLHDL 2.3   Thyroid  Function Tests: No results for input(s): TSH, T4TOTAL, FREET4, T3FREE, THYROIDAB in the last 72 hours. Anemia Panel: No results for input(s): VITAMINB12, FOLATE, FERRITIN, TIBC, IRON, RETICCTPCT in the last 72 hours. Sepsis Labs: No results for input(s): PROCALCITON, LATICACIDVEN in the last 168 hours.  No results found for this or any previous visit (from the past 240 hours).       Radiology Studies: CT ANGIO HEAD NECK W WO CM Result Date: 04/18/2024 EXAM: CT HEAD WITHOUT CTA HEAD AND NECK WITH AND WITHOUT 04/18/2024 02:10:27 PM TECHNIQUE: CTA of the head and neck was performed with and without the administration of 75 mL of intravenous contrast (iohexol  (OMNIPAQUE ) 350 MG/ML injection 75 mL IOHEXOL  350 MG/ML SOLN). Noncontrast CT of the head with reconstructed 2-D images are also provided for review. Multiplanar 2D and/or 3D reformatted images are provided for review. Automated exposure control, iterative  reconstruction, and/or weight based adjustment of the mA/kV was utilized to reduce the radiation dose to as low as reasonably achievable. COMPARISON: MRI head 04/18/2024 CLINICAL HISTORY: Neuro deficit, acute, stroke suspected; Right sided weakness. FINDINGS: CT HEAD: BRAIN AND VENTRICLES: Subtle hypodensity in the posterior limb of the left internal capsule corresponds to an acute infarct on MRI. Hypodensities elsewhere in the cerebral white matter bilaterally are nonspecific but compatible with mild chronic small vessel ischemic disease. Small chronic infarcts are again noted in the left frontal and right occipital lobes as well as right thalamus and left caudate. No acute intracranial hemorrhage. No mass effect. No midline shift. No extra-axial fluid collection. No hydrocephalus. ORBITS: No acute abnormality. SINUSES AND MASTOIDS: Small osteoma in a posterior left ethmoid air cell. Mild mucosal thickening in the paranasal sinuses. Trace bilateral mastoid fluid. CTA NECK: AORTIC ARCH AND ARCH VESSELS: Mild atherosclerotic calcification in the aortic arch and moderate calcification in the subclavian arteries. No dissection or arterial injury. No significant stenosis of the brachiocephalic or subclavian arteries. CERVICAL CAROTID ARTERIES: Moderately extensive, predominantly calcified plaque throughout the mid and distal common carotid arteries and proximal internal carotid arteries bilaterally. 50% stenosis of the distal right common carotid artery. No significant stenosis of the cervical ICAs. No dissection or arterial injury. CERVICAL VERTEBRAL ARTERIES: Patent and strongly dominant left vertebral artery with calcified plaque at its origin resulting in moderate to severe stenosis. Diminutive right vertebral artery with poor visualization of the proximal right V1 segment which may be severely narrowed or occluded. The right vertebral artery is patent from the distal V1 segment throughout the remainder of the neck  but is diffusely small in caliber and mildly irregular. No dissection or arterial injury. LUNGS AND MEDIASTINUM: Unremarkable. SOFT TISSUES: Diffusely enlarged, heterogeneous, multinodular thyroid  gland including an approximately 4.3 cm nodule on the right. Mild substernal extension of the thyroid . BONES: Moderate cervical spondylosis. CTA HEAD: ANTERIOR CIRCULATION: The intracranial internal carotid arteries are patent with relatively diffuse atherosclerotic calcification resulting in moderate right and mild left lacerum segment stenoses and up to moderate right cavernous segment stenosis. ACAs and MCAs are patent without evidence of a proximal branch occlusion or significant proximal stenosis. The right A1 segment is absent. No aneurysm. POSTERIOR CIRCULATION: The intracranial left vertebral artery is patent and dominant, supplying the basilar artery and with up to mild V4 stenosis due to calcified plaque. The intracranial right vertebral artery is patent and small with  mild stenosis proximally and with the vessel terminating at PICA. The basilar artery is widely patent. There are large posterior communicating arteries bilaterally with hypoplasia of the P1 segments. Both PCAs are patent without evidence of a significant proximal stenosis. No aneurysm. OTHER: No dural venous sinus thrombosis on this non-dedicated study. IMPRESSION: 1. Known acute lacunar infarct in the left internal capsule. 2. Age advanced atherosclerosis in the head and neck without a large vessel occlusion in the anterior circulation. 3. 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 4. Dominant left vertebral artery with moderate to severe stenosis of its origin. 5. 50% stenosis of the distal right common carotid artery. 6. Moderate right and mild left intracranial ICA stenoses. 7. Approximately 4.3 cm right thyroid  nodule in a diffusely enlarged, heterogeneous multinodular thyroid  gland.  Recommend non-emergent thyroid  ultrasound. Electronically signed by: Dasie Hamburg MD 04/18/2024 02:41 PM EST RP Workstation: [id number]   MR BRAIN WO CONTRAST Result Date: 04/18/2024 EXAM: MRI BRAIN WITHOUT CONTRAST 04/18/2024 01:58:23 PM TECHNIQUE: Multiplanar multisequence MRI of the head/brain was performed without the administration of intravenous contrast. COMPARISON: None available. CLINICAL HISTORY: R sided ataxia. Right arm and leg weakness. FINDINGS: BRAIN AND VENTRICLES: There is a 1 cm acute infarct in the posterior limb of the left internal capsule. No intracranial hemorrhage, mass, midline shift, hydrocephalus, or extra-axial fluid collection is identified. Small chronic cortical infarcts are noted in the right occipital and high left frontal lobes. Scattered T2 hyperintensities in the cerebral white matter bilaterally are nonspecific but compatible with mild chronic small vessel ischemic disease. There are chronic lacunar infarcts in the pons, right thalamus, and left caudate nucleus. There is mild cerebral atrophy. Major intracranial vascular flow voids are preserved. ORBITS: No acute abnormality. SINUSES AND MASTOIDS: Mild mucosal thickening in the paranasal sinuses. Trace bilateral mastoid fluid. BONES AND SOFT TISSUES: Normal marrow signal. Subcutaneous lipoma in the right suboccipital region. IMPRESSION: 1. Acute infarct in the posterior limb of the left internal capsule. 2. Chronic small vessel ischemic disease with small chronic infarcts as above. Electronically signed by: Dasie Hamburg MD 04/18/2024 02:22 PM EST RP Workstation: [id number]   CARDIAC CATHETERIZATION Result Date: 04/17/2024 Images from the original result were not included. Cardiac Catheterization 04/17/2024: Hemodynamic data: LV 130/36, EDP 14 mmHg.  Ao 138/73, mean 102 mmHg.  There is no pressure gradient across the aortic valve. Angiographic data: LM: Mildly calcified, widely patent. LAD: Severely calcified in the proximal  segment.  There is a ostial proximal 90% stenosis followed by diffuse long segment 50% stenosis and a previously placed proximal to mid LAD stent (3.0 x 18 mm Onyx on 05/26/2021) has 30 to 40% ISR.  Mid to distal LAD has mild disease. LCx: Large vessel, gives origin to large OM1 and large OM 3.  OM1 has ostial 90% stenosis. RCA: Large-caliber vessel and a dominant vessel.  Has anterior origin.  Has calcific diffuse proximal and ostial 30% stenosis. Impression and recommendations: Patient with high risk  CAD with ostial/proximal LAD focal calcific 90% stenosis and large OM1 with ostial 90% stenosis.  Options would be PCI to LAD and treat OM medically versus consider CABG, will discuss during Heart Team Meeting.           LOS: 1 day   Time spent= 35 mins    Deliliah Room, MD [facility]  If 7PM-7AM, please contact night-coverage  04/19/2024, 8:30 AM  "

## 2024-04-19 NOTE — Evaluation (Signed)
 Physical Therapy Evaluation Patient Details Name: [name] [name] MRN: [id number] DOB: October 11, 1950 Today's Date: 04/19/2024  History of Present Illness  [name] [name]  is a 74 y.o. male,  with medical history significant of with history of asthma, CHF, depression, diabetes mellitus type 2, ESRD on hemodialysis Monday Wednesday Friday, CAD status post LAD PCI in 2023, chronic systolic CHF.  - Patient followed by renal transplant plant team at [facility], and he was referred for cardiac clearance, he went for cardiac cath at [facility] 04/17/2024, which did show high risk CAD, with ostial/proximal LAD focal calcification 90% stenosis, with large OMI with ostial 90% stenosis, options were for PCI to LAD, and OM medical treatment, versus CABG.  - Patient presents to ED today due to complaints of weakness, presents to ED with right-sided weakness, reports he finished his cardiac cath he felt some tingling numbness in the right upper arm which he thought initially secondary to his cardiac cath as it was accessed via right radial, but reports this morning he had significant weakness when he was standing up to go for dialysis and his right arm and right leg, unable to walk which prompted him to come to ED, reports some improvement in his movement currently.  - in  ED, brain MRI was significant for acute CVA, so patient admitted for further workup.   Clinical Impression  Patient agreeable to OT/PT co-evaluation. 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 This date, patient demonstrates normal light touch sensation in BLE, equal coordination in BLE but does demonstrate a difference in LE strength, with RLE showing decreased knee ext and hip flexion strength. Pt also demonstrates some difficulty when donning socks due to dec R hand grip and coordination. Pt requires CGA/supervision for bed mobility, min assist for transfers and ambulation without AD but CGA  with RW. Pt does demonstrate altered gait pattern, most notable for decreased foot clearance with RLE causing some increased shuffling and increasing pt fall risk. Pt tolerates sitting in chair at end of session with alarm set, all needs met, and call button in reach. Patient will benefit from continued skilled physical therapy acutely and in recommended venue in order to address current deficits to return to PLOF.        If plan is discharge home, recommend the following: A little help with walking and/or transfers;A little help with bathing/dressing/bathroom;Assistance with cooking/housework;Assist for transportation;Help with stairs or ramp for entrance   Can travel by private vehicle        Equipment Recommendations None recommended by PT  Recommendations for Other Services       Functional Status Assessment Patient has had a recent decline in their functional status and demonstrates the ability to make significant improvements in function in a reasonable and predictable amount of time.     Precautions / Restrictions Precautions Precautions: Fall Recall of Precautions/Restrictions: Intact Restrictions Weight Bearing Restrictions Per Provider Order: No      Mobility  Bed Mobility Overal bed mobility: Needs Assistance Bed Mobility: Supine to Sit     Supine to sit: Supervision, Contact guard     General bed mobility comments: No physical assist. Mild labored effort, no use of bed railings    Transfers Overall transfer level: Needs assistance Equipment used: Rolling walker (2 wheels), None Transfers: Sit to/from Stand, Bed to chair/wheelchair/BSC Sit to Stand: Contact guard assist, Min assist   Step pivot transfers: Min assist  General transfer comment: Min assist with first STS and step pivot without RW, pt unsteady and fearful of falling, improved stability    Ambulation/Gait Ambulation/Gait assistance: Contact guard assist, Min assist Gait Distance  (Feet): 40 Feet Assistive device: Rolling walker (2 wheels) Gait Pattern/deviations: Step-through pattern, Decreased step length - right, Decreased step length - left, Decreased stride length, Shuffle Gait velocity: Dec     General Gait Details: Pt ambulates in room and in hall with RW and CGA/min assist, demo the above deviations along with dec foot clearance at times, improves with verbal cueing but dec with inc fatigue, generally unsteady with loabored movement, multiple steps for turn with RW  Stairs            Wheelchair Mobility     Tilt Bed    Modified Rankin (Stroke Patients Only)       Balance Overall balance assessment: Needs assistance Sitting-balance support: No upper extremity supported, Feet supported Sitting balance-Leahy Scale: Good Sitting balance - Comments: seated at EOB   Standing balance support: No upper extremity supported, During functional activity Standing balance-Leahy Scale: Poor Standing balance comment: poor to fair without RW; fair with RW                             Pertinent Vitals/Pain Pain Assessment Pain Assessment: No/denies pain    Home Living Family/patient expects to be discharged to:: Private residence Living Arrangements: Spouse/significant other Available Help at Discharge: Family;Available 24 hours/day Type of Home: House Home Access: Level entry       Home Layout: One level;Other (Comment) (pt reports 1-2 steps in home with rails into kitchen and den) Home Equipment: Agricultural Consultant (2 wheels)      Prior Function Prior Level of Function : Independent/Modified Independent;Driving             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 Reports no falls. ADLs Comments: Independent.     Extremity/Trunk Assessment   Upper Extremity Assessment Upper Extremity Assessment: Defer to OT evaluation RUE Deficits / Details: 4-/5 shoulder abduction; 4+/5 shoulder flexion; elbow flex/ext; wrist  flex/ext; 4/5 gross grasp RUE Sensation: WNL RUE Coordination: decreased fine motor;decreased gross motor    Lower Extremity Assessment Lower Extremity Assessment: Generalized weakness;RLE deficits/detail (Other than MMT/strength, No stark difference between sensation, or coordination bilaterally.) RLE Deficits / Details: R knee ext and hip flexion MMT 4-/5 (dec compared to LLE, LLE WNL 4+ to 5/5) RLE Sensation: WNL RLE Coordination: decreased gross motor;WNL    Cervical / Trunk Assessment Cervical / Trunk Assessment: Normal  Communication   Communication Communication: No apparent difficulties    Cognition Arousal: Alert Behavior During Therapy: WFL for tasks assessed/performed                             Following commands: Intact       Cueing Cueing Techniques: Verbal cues, Visual cues     General Comments General comments (skin integrity, edema, etc.): Pt left with overlay hands fine motor pad to use in the room.    Exercises     Assessment/Plan    PT Assessment Patient needs continued PT services;All further PT needs can be met in the next venue of care  PT Problem List Decreased strength;Decreased range of motion;Decreased activity tolerance;Decreased balance;Decreased mobility       PT Treatment Interventions DME instruction;Neuromuscular re-education;Gait training;Functional mobility training;Therapeutic activities;Therapeutic exercise;Patient/family  education    PT Goals (Current goals can be found in the Care Plan section)  Acute Rehab PT Goals Patient Stated Goal: Return home following short IPR stay PT Goal Formulation: With patient Time For Goal Achievement: 05/03/24 Potential to Achieve Goals: Good    Frequency Min 5X/week     Co-evaluation PT/OT/SLP Co-Evaluation/Treatment: Yes Reason for Co-Treatment: To address functional/ADL transfers PT goals addressed during session: Mobility/safety with mobility OT goals addressed during  session: ADL's and self-care       AM-PAC PT 6 Clicks Mobility  Outcome Measure Help needed turning from your back to your side while in a flat bed without using bedrails?: None Help needed moving from lying on your back to sitting on the side of a flat bed without using bedrails?: None Help needed moving to and from a bed to a chair (including a wheelchair)?: A Little Help needed standing up from a chair using your arms (e.g., wheelchair or bedside chair)?: A Little Help needed to walk in hospital room?: A Little Help needed climbing 3-5 steps with a railing? : A Lot 6 Click Score: 19    End of Session Equipment Utilized During Treatment: Gait belt Activity Tolerance: Patient tolerated treatment well Patient left: in chair;with call bell/phone within reach;with chair alarm set Nurse Communication: Mobility status PT Visit Diagnosis: Other abnormalities of gait and mobility (R26.89);Other symptoms and signs involving the nervous system (R29.898);Muscle weakness (generalized) (M62.81);Hemiplegia and hemiparesis Hemiplegia - Right/Left: Right Hemiplegia - dominant/non-dominant: Dominant Hemiplegia - caused by: Cerebral infarction    Time: 9186-9164 PT Time Calculation (min) (ACUTE ONLY): 22 min   Charges:   PT Evaluation $PT Eval Moderate Complexity: 1 Mod   PT General Charges $$ ACUTE PT VISIT: 1 Visit        11:35 AM, 04/19/2024 Rosaria Settler, PT, DPT [place] [facility]

## 2024-04-19 NOTE — Plan of Care (Signed)
" °  Problem: Education: Goal: Knowledge of General Education information will improve Description: Including pain rating scale, medication(s)/side effects and non-pharmacologic comfort measures Outcome: Progressing   Problem: Pain Managment: Goal: General experience of comfort will improve and/or be controlled Outcome: Progressing   Problem: Safety: Goal: Ability to remain free from injury will improve Outcome: Progressing   Problem: Education: Goal: Knowledge of disease or condition will improve Outcome: Progressing Goal: Knowledge of secondary prevention will improve (MUST DOCUMENT ALL) Outcome: Progressing   Problem: Ischemic Stroke/TIA Tissue Perfusion: Goal: Complications of ischemic stroke/TIA will be minimized Outcome: Progressing   Problem: Self-Care: Goal: Ability to participate in self-care as condition permits will improve Outcome: Progressing Goal: Verbalization of feelings and concerns over difficulty with self-care will improve Outcome: Progressing Goal: Ability to communicate needs accurately will improve Outcome: Progressing   Problem: Nutrition: Goal: Risk of aspiration will decrease Outcome: Progressing   "

## 2024-04-20 DIAGNOSIS — I639 Cerebral infarction, unspecified: Secondary | ICD-10-CM | POA: Diagnosis not present

## 2024-04-20 LAB — ECHOCARDIOGRAM COMPLETE
AR max vel: 3.02 cm2
AV Area VTI: 2.77 cm2
AV Area mean vel: 2.94 cm2
AV Mean grad: 8 mmHg
AV Peak grad: 14.4 mmHg
Ao pk vel: 1.9 m/s
Area-P 1/2: 2.32 cm2
Calc EF: 66.4 %
Height: 72 in
S' Lateral: 3.5 cm
Single Plane A2C EF: 64.8 %
Single Plane A4C EF: 67.2 %
Weight: 3548.52 [oz_av]

## 2024-04-20 LAB — HEPATITIS B SURFACE ANTIBODY, QUANTITATIVE: Hep B S AB Quant (Post): <3.5 m[IU]/mL — ABNORMAL LOW

## 2024-04-20 MED ORDER — SEVELAMER CARBONATE 800 MG PO TABS
800.0000 mg | ORAL_TABLET | Freq: Three times a day (TID) | ORAL | Status: DC
  Administered 2024-04-20 – 2024-04-24 (×11): 800 mg via ORAL
  Filled 2024-04-20 (×11): qty 1

## 2024-04-20 MED ORDER — HYDRALAZINE HCL 20 MG/ML IJ SOLN
10.0000 mg | INTRAMUSCULAR | Status: DC | PRN
  Administered 2024-04-20 – 2024-04-22 (×2): 10 mg via INTRAVENOUS
  Filled 2024-04-20 (×2): qty 1

## 2024-04-20 MED ORDER — HYDRALAZINE HCL 20 MG/ML IJ SOLN
10.0000 mg | Freq: Four times a day (QID) | INTRAMUSCULAR | Status: DC | PRN
  Administered 2024-04-20: 10 mg via INTRAVENOUS
  Filled 2024-04-20: qty 1

## 2024-04-20 MED ORDER — HYDROXYZINE HCL 25 MG PO TABS
25.0000 mg | ORAL_TABLET | Freq: Once | ORAL | Status: AC | PRN
  Administered 2024-04-20: 25 mg via ORAL
  Filled 2024-04-20: qty 1

## 2024-04-20 NOTE — Progress Notes (Signed)
 Admit: 04/18/2024 LOS: 2  38M ESRD MWF via LUE AVF [facility] with acute CVA  Subjective:  HD yesterday, 2 L UF; tolerated well Plan for CIR, in process  01/01 0701 - 01/02 0700 In: 840 [P.O.:840] Out: 2000   Filed Weights   04/18/24 1149 04/19/24 1630 04/19/24 1945  Weight: 100.6 kg 103.8 kg 101.8 kg    Scheduled Meds:  aspirin  EC  81 mg Oral Daily   atorvastatin   80 mg Oral Daily   Chlorhexidine  Gluconate Cloth  6 each Topical Q0600   clopidogrel   75 mg Oral Daily   fluticasone furoate-vilanterol  1 puff Inhalation Daily   heparin   5,000 Units Subcutaneous Q8H   pantoprazole   40 mg Oral Daily   Continuous Infusions: PRN Meds:.acetaminophen  **OR** acetaminophen  (TYLENOL ) oral liquid 160 mg/5 mL **OR** acetaminophen , nitroGLYCERIN , senna-docusate  Current Labs: reviewed   Physical Exam:  Blood pressure (!) 157/69, pulse 63, temperature 98 F (36.7 C), temperature source Oral, resp. rate 16, height 6' (1.829 m), weight 101.8 kg, SpO2 96%. NAD Regular, normal S1 and S2 Clear bilaterally R upper arm AV fistula with bruit and thrill present Soft, nontender No significant edema  A Acute CVA: Per primary neurology, likely to CIR ESRD on HD MWF [facility] using AV fistula: Off schedule this week, next treatment on Saturday 1/3 and then back on MWF schedule next week Hypertension: Currently held because of acute CVA, TRH managing Anemia: Hemoglobin stable without ESA need CKD-BMD: Hyperphosphatemia noted, will start sevelamer  1 before every meal History of CAD: h/o PCI, LHC 12/30 showed lesions in need of intervention - PCI vs CABG being discussed by cardiology.   P Dialysis ordered for tomorrow Starting sevelamer  Medication Issues; Preferred narcotic agents for pain control are hydromorphone, fentanyl , and methadone. Morphine should not be used.  Baclofen should be avoided Avoid oral sodium phosphate and magnesium citrate based laxatives / bowel preps    Bernardino Gasman MD 04/20/2024, 10:36 AM  Recent Labs  Lab 04/18/24 1250 04/18/24 1257 04/19/24 0516 04/19/24 0750  NA 143 139 140  --   K 4.3 4.1 4.6  --   CL 98 103 98  --   CO2 23  --  27  --   GLUCOSE 95 97 71  --   BUN 49* 48* 53*  --   CREATININE 10.60* 11.70* 11.60*  --   CALCIUM  10.0  --  9.2  --   PHOS  --   --   --  8.4*   Recent Labs  Lab 04/18/24 1250 04/18/24 1257 04/19/24 0516  WBC 4.4  --  4.5  HGB 12.2* 12.6* 11.1*  HCT 39.4 37.0* 35.5*  MCV 94.0  --  92.7  PLT 97*  --  178

## 2024-04-20 NOTE — Progress Notes (Signed)
 Pt receives out-pt HD at [facility], MWF, [phone] am chair time. Contacted clinic to inform of pt arrival. Will continue to assist as needed.    Joud Pettinato Dialysis Navigator [phone]

## 2024-04-20 NOTE — Plan of Care (Signed)

## 2024-04-20 NOTE — Progress Notes (Addendum)
 Inpatient Rehab Admissions Coordinator:  Consult received. Attempted to contact pt. No one answered the room phone or his cell phone. Not able to leave a message on his cell phone. Will continue to follow.   Spoke with pt on the telephone. Discussed CIR goals and expectations. Discussed average length of stay, insurance authorization requirement and discharge home after completion of CIR. Pt acknowledged understanding and is interested in pursuing CIR. Pt gave permission to contact his wife Mattie. Spoke with Mattie on the phone. Mattie also acknowledged understanding of CIR goals and expectations. Mattie is supportive of pt pursuing CIR. Mattie confirmed that she will be able to provide 24/7 supervision for pt after discharge. Mattie mentioned that family and friends could provide PRN physical support if needed. Will continue to follow.  Tinnie Yvone Cohens, MS, CCC-SLP Admissions Coordinator [phone]

## 2024-04-20 NOTE — Heart Team MDD (Signed)
" ° °  Heart Team Multi-Disciplinary Discussion  Patient: [name] [name]  DOB: 1951-04-16  MRN: [id number]   Date: 04/20/2024  11:15 AM    Attendees: Interventional Cardiology: Lonni Hanson, MD Gordy Bergamo, MD Newman Lawrence, MD Peter Jordan, MD      Patient History: 74 y.o. male,  with medical history significant of with history of asthma, CHF, depression, diabetes mellitus type 2, ESRD on hemodialysis Monday Wednesday Friday, HTN, Hyperlipidemia, chronic systolic CHF, CVA.   Risk Factors: Hypertension Diabetes Mellitus Hyperlipidemia   CAD History: CAD status post LAD PCI in 2023   Review of Prior Angiography and PCI Procedures: Cardiac Cath films from 04/17/24 were reviewed in detail.   Discussion: The team weighed the options of PCI with atherectomy vs CABG.  CABG considered but patient preference and anatomy favor PCI for LAD only; OM lesion to be managed medically after recovery from CVA.    Recommendations: PCI     Shona Palma, RN  04/20/2024 11:15 AM   "

## 2024-04-20 NOTE — Progress Notes (Addendum)
 AT 1206 : Telemetry called and said patient had run of SVT, 17 beats, rate 140. Now SR rate 60s and RN assessed patient with no symptoms, no chest pain, no dizziness, no complaints. RN made Dr Dino aware

## 2024-04-20 NOTE — Progress Notes (Addendum)
 " PROGRESS NOTE    [name] [name]  FMW:[id number] DOB: [birth year] DOA: 04/18/2024 PCP: Tobie Suzzane POUR, MD   Brief Narrative:   74 y.o. male,  with medical history significant of with history of asthma, CHF, depression, diabetes mellitus type 2, ESRD on hemodialysis Monday Wednesday Friday, CAD status post LAD PCI in 2023, chronic systolic CHF. - Patient followed by renal transplant plant team at [facility], and he was referred for cardiac clearance, he went for cardiac cath at [facility] 04/17/2024, which did show high risk CAD, with ostial/proximal LAD focal calcification 90% stenosis, with large OMI with ostial 90% stenosis, options were for PCI to LAD, and OM medical treatment, versus CABG.  Patient presented to [facility] ED on 04/18/2025 due to complaints of weakness, presents to ED with right-sided weakness  Diagnosed with acute left internal capsule CVA.  Neurology consulted.  Nephrology consulted for ESRD management.  Assessment & Plan:  Principal Problem:   Stroke (cerebrum) (HCC) Active Problems:   ESRD (end stage renal disease) (HCC)   Type 2 diabetes mellitus in patient with obesity (HCC)   Obesity (BMI 30-39.9)   Heart failure with improved ejection fraction (HFimpEF) (HCC)   HLD (hyperlipidemia)   Hemodialysis-associated hypotension   Acute ischemic CVA,likely cardioembolic, POA: MRI of the brain showed evidence of acute infarct in the posterior limb of the left internal capsule. Was not a candidate for tPA as he recently had cardiac catheterization procedure done on 04/17/2024. - Continue with aspirin , Plavix  and statin. -Follow-up transthoracic echocardiogram-Pending - Continue to monitor on telemetry - PT/OT/speech therapy consultations placed - Discussed with Dr Shelton and recommendation is to continue with DAPT and statin and outpt neuro follow up.  -Needs CIR on discharge.  Coronary artery disease status post stenting, POA: Prior history of stenting.   Cardiac catheterization procedure done via right radial artery on 04/17/2024 showed high risk lesions.   Multidisciplinary cardiology team has decided to go with PCI. Timing will be decided later. Continue with aspirin , statin and Plavix   Class I obesity: Dietary modifications and physical activity to facilitate weight loss. He may be a candidate for semaglutide (additional benefit in patients with CAD)  ESRD,POA: On HD  MWF -Nephrology consulted. -HD done on 04/19/24. Next HD on 1/3 -Rest of the management as per nephrology  Right thyroid  nodule,POA: -Outpatient thyroid  ultrasound  Type 2 diabetes mellitus,POA: -Well controlled -On no medications  Disposition: Likely needs CIR placement.    DVT prophylaxis: heparin  injection 5,000 Units Start: 04/19/24 0600     Code Status: Full Code Family Communication: None at the bedside Status is: Inpatient Remains inpatient appropriate because: Acute CVA    Subjective:  He received HD yesterday, He is interested in going to CIR on discharge. He feels that his right sided strength is almost the same as yesterday.  Examination:  General exam: Appears calm and comfortable  Respiratory system: Clear to auscultation. Respiratory effort normal. Cardiovascular system: S1 & S2 heard, RRR. No JVD, murmurs, rubs, gallops or clicks. No pedal edema. Gastrointestinal system: Abdomen is nondistended, soft and nontender. No organomegaly or masses felt. Normal bowel sounds heard. Central nervous system: Alert and oriented. No focal neurological deficits. Extremities: Power 4/5 on the right arm and right leg, 5/5 on the left arm and left leg Skin: No rashes, lesions or ulcers Psychiatry: Judgement and insight appear normal. Mood & affect appropriate.      Diet Orders (From admission, onward)     Start  Ordered   04/18/24 1827  Diet renal with fluid restriction Fluid restriction: 1200 mL Fluid; [name] service appropriate? Yes; Fluid  consistency: Thin  Diet effective now       Question Answer Comment  Fluid restriction: 1200 mL Fluid   [name] service appropriate? Yes   Fluid consistency: Thin      04/18/24 1826            Objective: Vitals:   04/19/24 2349 04/20/24 0408 04/20/24 0732 04/20/24 0736  BP: (!) 159/135 (!) 171/75 (!) 157/69   Pulse: 69  63   Resp: 15 16    Temp: 97.8 F (36.6 C) (!) 97.5 F (36.4 C) 98 F (36.7 C)   TempSrc: Oral Oral Oral   SpO2: 99% 97% 96% 96%  Weight:      Height:        Intake/Output Summary (Last 24 hours) at 04/20/2024 0901 Last data filed at 04/20/2024 9147 Gross per 24 hour  Intake 1080 ml  Output 2000 ml  Net -920 ml   Filed Weights   04/18/24 1149 04/19/24 1630 04/19/24 1945  Weight: 100.6 kg 103.8 kg 101.8 kg    Scheduled Meds:  aspirin  EC  81 mg Oral Daily   atorvastatin   80 mg Oral Daily   Chlorhexidine  Gluconate Cloth  6 each Topical Q0600   clopidogrel   75 mg Oral Daily   fluticasone furoate-vilanterol  1 puff Inhalation Daily   heparin   5,000 Units Subcutaneous Q8H   pantoprazole   40 mg Oral Daily   Continuous Infusions:    Nutritional status     Body mass index is 30.44 kg/m.  Data Reviewed:   CBC: Recent Labs  Lab 04/18/24 1250 04/18/24 1257 04/19/24 0516  WBC 4.4  --  4.5  HGB 12.2* 12.6* 11.1*  HCT 39.4 37.0* 35.5*  MCV 94.0  --  92.7  PLT 97*  --  178   Basic Metabolic Panel: Recent Labs  Lab 04/18/24 1250 04/18/24 1257 04/19/24 0516 04/19/24 0750  NA 143 139 140  --   K 4.3 4.1 4.6  --   CL 98 103 98  --   CO2 23  --  27  --   GLUCOSE 95 97 71  --   BUN 49* 48* 53*  --   CREATININE 10.60* 11.70* 11.60*  --   CALCIUM  10.0  --  9.2  --   PHOS  --   --   --  8.4*   GFR: Estimated Creatinine Clearance: 6.9 mL/min (A) (by C-G formula based on SCr of 11.6 mg/dL (H)). Liver Function Tests: Recent Labs  Lab 04/18/24 1250 04/19/24 0516  AST 22 20  ALT 15 11  ALKPHOS 95 86  BILITOT 0.5 0.5  PROT 7.3 6.0*   ALBUMIN 4.3 3.7   No results for input(s): LIPASE, AMYLASE in the last 168 hours. No results for input(s): AMMONIA in the last 168 hours. Coagulation Profile: No results for input(s): INR, PROTIME in the last 168 hours. Cardiac Enzymes: No results for input(s): CKTOTAL, CKMB, CKMBINDEX, TROPONINI in the last 168 hours. BNP (last 3 results) No results for input(s): PROBNP in the last 8760 hours. HbA1C: Recent Labs    04/18/24 1250  HGBA1C 5.9*   CBG: Recent Labs  Lab 04/18/24 1211  GLUCAP 79   Lipid Profile: Recent Labs    04/19/24 0516  CHOL 102  HDL 45  LDLCALC 46  TRIG 57  CHOLHDL 2.3   Thyroid  Function Tests: No  results for input(s): TSH, T4TOTAL, FREET4, T3FREE, THYROIDAB in the last 72 hours. Anemia Panel: No results for input(s): VITAMINB12, FOLATE, FERRITIN, TIBC, IRON, RETICCTPCT in the last 72 hours. Sepsis Labs: No results for input(s): PROCALCITON, LATICACIDVEN in the last 168 hours.  No results found for this or any previous visit (from the past 240 hours).       Radiology Studies: CT ANGIO HEAD NECK W WO CM Result Date: 04/18/2024 EXAM: CT HEAD WITHOUT CTA HEAD AND NECK WITH AND WITHOUT 04/18/2024 02:10:27 PM TECHNIQUE: CTA of the head and neck was performed with and without the administration of 75 mL of intravenous contrast (iohexol  (OMNIPAQUE ) 350 MG/ML injection 75 mL IOHEXOL  350 MG/ML SOLN). Noncontrast CT of the head with reconstructed 2-D images are also provided for review. Multiplanar 2D and/or 3D reformatted images are provided for review. Automated exposure control, iterative reconstruction, and/or weight based adjustment of the mA/kV was utilized to reduce the radiation dose to as low as reasonably achievable. COMPARISON: MRI head 04/18/2024 CLINICAL HISTORY: Neuro deficit, acute, stroke suspected; Right sided weakness. FINDINGS: CT HEAD: BRAIN AND VENTRICLES: Subtle hypodensity in the posterior  limb of the left internal capsule corresponds to an acute infarct on MRI. Hypodensities elsewhere in the cerebral white matter bilaterally are nonspecific but compatible with mild chronic small vessel ischemic disease. Small chronic infarcts are again noted in the left frontal and right occipital lobes as well as right thalamus and left caudate. No acute intracranial hemorrhage. No mass effect. No midline shift. No extra-axial fluid collection. No hydrocephalus. ORBITS: No acute abnormality. SINUSES AND MASTOIDS: Small osteoma in a posterior left ethmoid air cell. Mild mucosal thickening in the paranasal sinuses. Trace bilateral mastoid fluid. CTA NECK: AORTIC ARCH AND ARCH VESSELS: Mild atherosclerotic calcification in the aortic arch and moderate calcification in the subclavian arteries. No dissection or arterial injury. No significant stenosis of the brachiocephalic or subclavian arteries. CERVICAL CAROTID ARTERIES: Moderately extensive, predominantly calcified plaque throughout the mid and distal common carotid arteries and proximal internal carotid arteries bilaterally. 50% stenosis of the distal right common carotid artery. No significant stenosis of the cervical ICAs. No dissection or arterial injury. CERVICAL VERTEBRAL ARTERIES: Patent and strongly dominant left vertebral artery with calcified plaque at its origin resulting in moderate to severe stenosis. Diminutive right vertebral artery with poor visualization of the proximal right V1 segment which may be severely narrowed or occluded. The right vertebral artery is patent from the distal V1 segment throughout the remainder of the neck but is diffusely small in caliber and mildly irregular. No dissection or arterial injury. LUNGS AND MEDIASTINUM: Unremarkable. SOFT TISSUES: Diffusely enlarged, heterogeneous, multinodular thyroid  gland including an approximately 4.3 cm nodule on the right. Mild substernal extension of the thyroid . BONES: Moderate cervical  spondylosis. CTA HEAD: ANTERIOR CIRCULATION: The intracranial internal carotid arteries are patent with relatively diffuse atherosclerotic calcification resulting in moderate right and mild left lacerum segment stenoses and up to moderate right cavernous segment stenosis. ACAs and MCAs are patent without evidence of a proximal branch occlusion or significant proximal stenosis. The right A1 segment is absent. No aneurysm. POSTERIOR CIRCULATION: The intracranial left vertebral artery is patent and dominant, supplying the basilar artery and with up to mild V4 stenosis due to calcified plaque. The intracranial right vertebral artery is patent and small with mild stenosis proximally and with the vessel terminating at PICA. The basilar artery is widely patent. There are large posterior communicating arteries bilaterally with hypoplasia of the P1 segments. Both  PCAs are patent without evidence of a significant proximal stenosis. No aneurysm. OTHER: No dural venous sinus thrombosis on this non-dedicated study. IMPRESSION: 1. Known acute lacunar infarct in the left internal capsule. 2. Age advanced atherosclerosis in the head and neck without a large vessel occlusion in the anterior circulation. 3. 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 4. Dominant left vertebral artery with moderate to severe stenosis of its origin. 5. 50% stenosis of the distal right common carotid artery. 6. Moderate right and mild left intracranial ICA stenoses. 7. Approximately 4.3 cm right thyroid  nodule in a diffusely enlarged, heterogeneous multinodular thyroid  gland. Recommend non-emergent thyroid  ultrasound. Electronically signed by: Dasie Hamburg MD 04/18/2024 02:41 PM EST RP Workstation: [id number]   MR BRAIN WO CONTRAST Result Date: 04/18/2024 EXAM: MRI BRAIN WITHOUT CONTRAST 04/18/2024 01:58:23 PM TECHNIQUE: Multiplanar multisequence MRI of the head/brain was performed without  the administration of intravenous contrast. COMPARISON: None available. CLINICAL HISTORY: R sided ataxia. Right arm and leg weakness. FINDINGS: BRAIN AND VENTRICLES: There is a 1 cm acute infarct in the posterior limb of the left internal capsule. No intracranial hemorrhage, mass, midline shift, hydrocephalus, or extra-axial fluid collection is identified. Small chronic cortical infarcts are noted in the right occipital and high left frontal lobes. Scattered T2 hyperintensities in the cerebral white matter bilaterally are nonspecific but compatible with mild chronic small vessel ischemic disease. There are chronic lacunar infarcts in the pons, right thalamus, and left caudate nucleus. There is mild cerebral atrophy. Major intracranial vascular flow voids are preserved. ORBITS: No acute abnormality. SINUSES AND MASTOIDS: Mild mucosal thickening in the paranasal sinuses. Trace bilateral mastoid fluid. BONES AND SOFT TISSUES: Normal marrow signal. Subcutaneous lipoma in the right suboccipital region. IMPRESSION: 1. Acute infarct in the posterior limb of the left internal capsule. 2. Chronic small vessel ischemic disease with small chronic infarcts as above. Electronically signed by: Dasie Hamburg MD 04/18/2024 02:22 PM EST RP Workstation: [id number]           LOS: 2 days   Time spent= 35 mins    [name] Room, MD [facility]  If 7PM-7AM, please contact night-coverage  04/20/2024, 9:01 AM  "

## 2024-04-20 NOTE — Progress Notes (Signed)
"   Upon 0400 NIH I scored him a 1 because his speech seems a bit more slurred. He does seem a little weaker on the right side however, can hold extremities without drift. This could possibly be related to disrupted sleep. He reports he feels the same as last q 4 NIH at midnight. NP Jesus was notified. [employer] Hakop Humbarger RN "

## 2024-04-20 NOTE — PMR Pre-admission (Shared)
 PMR Admission Coordinator Pre-Admission Assessment  Patient: [name] [name] is an 74 y.o., male MRN: [id number] DOB: 05-10-1950 Height: 6' (182.9 cm) Weight: 101.8 kg  Insurance Information HMO: ***    PPO: ***     PCP:      IPA:      80/20:      OTHER:  PRIMARY: [employer]      Policy#: [id number]      Subscriber: patient CM Name: ***      Phone#: ***     Fax#: *** Pre-Cert#: ***      Employer: *** Benefits:  Phone #: ***     Name: *** Eustacio. Date: ***     Deduct: ***      Out of Pocket Max: ***      Life Max: *** CIR: ***      SNF: *** Outpatient: ***     Co-Pay: *** Home Health: ***      Co-Pay: *** DME: ***     Co-Pay: *** Providers: in-network SECONDARY:       Policy#:      Phone#:   Financial Counselor:       Phone#:   The Best Boy for patients in Inpatient Rehabilitation Facilities with attached Privacy Act Statement-Health Care Records was provided and verbally reviewed with: Patient  Emergency Contact Information Contact Information     Name Relation Home Work Mobile   [name],[name] Spouse [phone]     [name], [name]   [phone]      Other Contacts   None on File     Current Medical History  Patient Admitting Diagnosis: CVA History of Present Illness: Pt is a 74 year old male with medical hx significant for: CAD s/p PCI on Plavix , CHF, ESRD on dialysis, DM II, asthma. Pt presented [facility] on 04/18/24 d/t right-sided weakness. Pt noted weakness after heart catheterization (12/30). Neurology consulted. MRI showed left internal capsul stroke. CTA negative for LVO but does have some mild atherosclerotic disease. Nephrology consulted. Cardiology recommending PCI. *** Therapy evaluations completed and CIR recommended d/t pt's deficits in functional mobility.  Complete NIHSS TOTAL: 1  Patient's medical record from [facility] has been reviewed by the rehabilitation admission coordinator and  physician.  Past Medical History  Past Medical History:  Diagnosis Date   Asthma    CAD (coronary artery disease)    Chronic edema    Chronic HFrEF (heart failure with reduced ejection fraction) (HCC)    Depression    Diabetes mellitus (HCC)    ESRD on hemodialysis (HCC)    MGUS (monoclonal gammopathy of unknown significance)    Obesity    prior weight loss    Has the patient had major surgery during 100 days prior to admission? No  Family History   family history includes Breast cancer in his mother; Diabetes in his brother and sister; Emphysema in his father.  Current Medications Current Medications[1]  Patients Current Diet:  Diet Order             Diet renal with fluid restriction Fluid restriction: 1200 mL Fluid; Room service appropriate? Yes; Fluid consistency: Thin  Diet effective now                   Precautions / Restrictions Precautions Precautions: Fall Restrictions Weight Bearing Restrictions Per Provider Order: No   Has the patient had 2 or more falls or a fall with injury in the past year? No  Prior  Activity Level Community (5-7x/wk): drives, gets out of house ~6 days/week  Prior Functional Level Self Care: Did the patient need help bathing, dressing, using the toilet or eating? Independent  Indoor Mobility: Did the patient need assistance with walking from room to room (with or without device)? Independent  Stairs: Did the patient need assistance with internal or external stairs (with or without device)? Independent  Functional Cognition: Did the patient need help planning regular tasks such as shopping or remembering to take medications? Independent  Patient Information Are you of Hispanic, Latino/a,or Spanish origin?: A. No, not of Hispanic, Latino/a, or Spanish origin What is your race?: B. Black or African American Do you need or want an interpreter to communicate with a doctor or health care staff?: 0. No  Patient's Response To:   Health Literacy and Transportation Is the patient able to respond to health literacy and transportation needs?: Yes Health Literacy - How often do you need to have someone help you when you read instructions, pamphlets, or other written material from your doctor or pharmacy?: Never In the past 12 months, has lack of transportation kept you from medical appointments or from getting medications?: No In the past 12 months, has lack of transportation kept you from meetings, work, or from getting things needed for daily living?: No  Home Assistive Devices / Equipment Home Equipment: Agricultural Consultant (2 wheels)  Prior Device Use: Indicate devices/aids used by the patient prior to current illness, exacerbation or injury? None of the above  Current Functional Level Cognition  Arousal/Alertness: Awake/alert Overall Cognitive Status: No family/caregiver present to determine baseline cognitive functioning Orientation Level: Oriented X4 Attention: Sustained Sustained Attention: Appears intact Memory: Appears intact Awareness: Appears intact Problem Solving: Appears intact (for simple tasks) Safety/Judgment: Appears intact Comments: Able to state he needed A with meal preparation, walker for A with walking/PT and OT for R arm weakness.    Extremity Assessment (includes Sensation/Coordination)  Upper Extremity Assessment: Defer to OT evaluation RUE Deficits / Details: 4-/5 shoulder abduction; 4+/5 shoulder flexion; elbow flex/ext; wrist flex/ext; 4/5 gross grasp RUE Sensation: WNL RUE Coordination: decreased fine motor, decreased gross motor  Lower Extremity Assessment: Generalized weakness, RLE deficits/detail (Other than MMT/strength, No stark difference between sensation, or coordination bilaterally.) RLE Deficits / Details: R knee ext and hip flexion MMT 4-/5 (dec compared to LLE, LLE WNL 4+ to 5/5) RLE Sensation: WNL RLE Coordination: decreased gross motor, WNL    ADLs  Overall ADL's :  Needs assistance/impaired Eating/Feeding: Set up, Sitting Grooming: Set up, Sitting Upper Body Bathing: Set up, Sitting Lower Body Bathing: Contact guard assist, Sitting/lateral leans Upper Body Dressing : Set up, Sitting Lower Body Dressing: Contact guard assist, Minimal assistance, Sitting/lateral leans Toilet Transfer: Minimal assistance, Stand-pivot, Rolling walker (2 wheels) Toilet Transfer Details (indicate cue type and reason): EOB to chair with RW Toileting- Clothing Manipulation and Hygiene: Contact guard assist, Sitting/lateral lean Tub/ Shower Transfer: Minimal assistance, Stand-pivot, Rolling walker (2 wheels) Functional mobility during ADLs: Contact guard assist, Minimal assistance, Rolling walker (2 wheels) General ADL Comments: Able to ambulate a short distance in the hall with RW which the pt does not use at baseline.    Mobility  Overal bed mobility: Needs Assistance Bed Mobility: Supine to Sit Supine to sit: Supervision, Contact guard General bed mobility comments: No physical assist. Mild labored effort, no use of bed railings    Transfers  Overall transfer level: Needs assistance Equipment used: Rolling walker (2 wheels), None Transfers: Sit to/from Stand,  Bed to chair/wheelchair/BSC Sit to Stand: Contact guard assist, Min assist Bed to/from chair/wheelchair/BSC transfer type:: Step pivot Step pivot transfers: Min assist General transfer comment: Min assist with first STS and step pivot without RW, pt unsteady and fearful of falling, improved stability    Ambulation / Gait / Stairs / Wheelchair Mobility  Ambulation/Gait Ambulation/Gait assistance: Contact guard assist, Min assist Gait Distance (Feet): 40 Feet Assistive device: Rolling walker (2 wheels) Gait Pattern/deviations: Step-through pattern, Decreased step length - right, Decreased step length - left, Decreased stride length, Shuffle General Gait Details: Pt ambulates in room and in hall with RW and  CGA/min assist, demo the above deviations along with dec foot clearance at times, improves with verbal cueing but dec with inc fatigue, generally unsteady with loabored movement, multiple steps for turn with RW Gait velocity: Dec    Posture / Balance Dynamic Sitting Balance Sitting balance - Comments: seated at EOB Balance Overall balance assessment: Needs assistance Sitting-balance support: No upper extremity supported, Feet supported Sitting balance-Leahy Scale: Good Sitting balance - Comments: seated at EOB Standing balance support: No upper extremity supported, During functional activity Standing balance-Leahy Scale: Poor Standing balance comment: poor to fair without RW; fair with RW    Special considerations/life events  NA   Previous Home Environment (from acute therapy documentation) Living Arrangements: Spouse/significant other  Lives With: Spouse Available Help at Discharge: Family, Available 24 hours/day Type of Home: House Home Layout: One level, Other (Comment) (pt reports 1-2 steps in home with rails into kitchen and den) Home Access: Stairs to enter Entrance Stairs-Rails: Can reach both Secretary/administrator of Steps: 3 Bathroom Shower/Tub: Health Visitor: Standard Bathroom Accessibility: Yes How Accessible: Accessible via walker Home Care Services: No  Discharge Living Setting Plans for Discharge Living Setting: Patient's home Type of Home at Discharge: House Discharge Home Layout: One level Discharge Home Access: Stairs to enter Entrance Stairs-Rails: Can reach both Entrance Stairs-Number of Steps: 3 Discharge Bathroom Shower/Tub: Walk-in shower Discharge Bathroom Toilet: Standard Discharge Bathroom Accessibility: Yes How Accessible: Accessible via walker Does the patient have any problems obtaining your medications?: No  Social/Family/Support Systems Anticipated Caregiver: [name] [name] (wife), family and friends Anticipated Product Manager Information: [name]: [phone] Ability/Limitations of Caregiver: [name] [name] can provided supervision Caregiver Availability: 24/7 Discharge Plan Discussed with Primary Caregiver: Yes Is Caregiver In Agreement with Plan?: Yes Does Caregiver/Family have Issues with Lodging/Transportation while Pt is in Rehab?: No  Goals Patient/Family Goal for Rehab: *** Expected length of stay: *** Pt/Family Agrees to Admission and willing to participate: Yes Program Orientation Provided & Reviewed with Pt/Caregiver Including Roles  & Responsibilities: Yes  Decrease burden of Care through IP rehab admission: NA  Possible need for SNF placement upon discharge: Not anticipated  Patient Condition: I have reviewed medical records from [facility], spoken with {CHL IP CSW RF:[id number]}, and patient and spouse. I discussed via phone for inpatient rehabilitation assessment.  Patient will benefit from ongoing PT, OT, and SLP, can actively participate in 3 hours of therapy a day 5 days of the week, and can make measurable gains during the admission.  Patient will also benefit from the coordinated team approach during an Inpatient Acute Rehabilitation admission.  The patient will receive intensive therapy as well as Rehabilitation physician, nursing, social worker, and care management interventions.  Due to safety, disease management, medication administration, pain management, and patient education the patient requires 24 hour a day rehabilitation nursing.  The patient is currently *** with mobility and  basic ADLs.  Discharge setting and therapy post discharge at home with home health is anticipated.  Patient has agreed to participate in the Acute Inpatient Rehabilitation Program and will admit {Time; today/tomorrow:10263}.  Preadmission Screen Completed By:  Tinnie SHAUNNA Yvone Delayne, 04/20/2024 4:41 PM ______________________________________________________________________   Discussed status with Dr. PIERRETTE on ***  at *** and received approval for admission today.  Admission Coordinator:  Tinnie SHAUNNA Yvone Delayne, CCC-SLP, time ***/Date ***   Assessment/Plan: Diagnosis: *** Does the need for close, 24 hr/day Medical supervision in concert with the patient's rehab needs make it unreasonable for this patient to be served in a less intensive setting? {yes_no_potentially:3041433} Co-Morbidities requiring supervision/potential complications: *** Due to {due un:6958565}, does the patient require 24 hr/day rehab nursing? {yes_no_potentially:3041433} Does the patient require coordinated care of a physician, rehab nurse, PT, OT, and SLP to address physical and functional deficits in the context of the above medical diagnosis(es)? {yes_no_potentially:3041433} Addressing deficits in the following areas: {deficits:3041436} Can the patient actively participate in an intensive therapy program of at least 3 hrs of therapy 5 days a week? {yes_no_potentially:3041433} The potential for patient to make measurable gains while on inpatient rehab is {potential:3041437} Anticipated functional outcomes upon discharge from inpatient rehab: {functional outcomes:304600100} PT, {functional outcomes:304600100} OT, {functional outcomes:304600100} SLP Estimated rehab length of stay to reach the above functional goals is: *** Anticipated discharge destination: {anticipated dc setting:21604} 10. Overall Rehab/Functional Prognosis: {potential:3041437}   MD Signature: ***    [1]  Current Facility-Administered Medications:    acetaminophen  (TYLENOL ) tablet 650 mg, 650 mg, Oral, Q4H PRN **OR** acetaminophen  (TYLENOL ) 160 MG/5ML solution 650 mg, 650 mg, Per Tube, Q4H PRN **OR** acetaminophen  (TYLENOL ) suppository 650 mg, 650 mg, Rectal, Q4H PRN, Elgergawy, Dawood S, MD   aspirin  EC tablet 81 mg, 81 mg, Oral, Daily, Elgergawy, Dawood S, MD, 81 mg at 04/20/24 0901   atorvastatin  (LIPITOR ) tablet 80 mg, 80 mg, Oral, Daily, Elgergawy, Dawood  S, MD, 80 mg at 04/20/24 0901   Chlorhexidine  Gluconate Cloth 2 % PADS 6 each, 6 each, Topical, Q0600, Kruska, Lindsay A, MD, 6 each at 04/20/24 0527   clopidogrel  (PLAVIX ) tablet 75 mg, 75 mg, Oral, Daily, Elgergawy, Dawood S, MD, 75 mg at 04/20/24 0901   fluticasone furoate-vilanterol (BREO ELLIPTA) 100-25 MCG/ACT 1 puff, 1 puff, Inhalation, Daily, Elgergawy, Brayton RAMAN, MD, 1 puff at 04/20/24 0736   heparin  injection 5,000 Units, 5,000 Units, Subcutaneous, Q8H, Elgergawy, Brayton RAMAN, MD, 5,000 Units at 04/20/24 1534   nitroGLYCERIN  (NITROSTAT ) SL tablet 0.4 mg, 0.4 mg, Sublingual, Q5 min PRN, Elgergawy, Brayton RAMAN, MD   pantoprazole  (PROTONIX ) EC tablet 40 mg, 40 mg, Oral, Daily, Elgergawy, Dawood S, MD, 40 mg at 04/20/24 0901   senna-docusate (Senokot-S) tablet 1 tablet, 1 tablet, Oral, QHS PRN, Elgergawy, Brayton RAMAN, MD   sevelamer  carbonate (RENVELA ) tablet 800 mg, 800 mg, Oral, TID WC, Marlee Motto B, MD, 800 mg at 04/20/24 1225

## 2024-04-20 NOTE — Progress Notes (Signed)
" °   04/20/24 2238  Assess: MEWS Score  BP (!) 204/82  MAP (mmHg) 113  Pulse Rate 68  Level of Consciousness Alert  Assess: MEWS Score  MEWS Temp 0  MEWS Systolic 2  MEWS Pulse 0  MEWS RR 0  MEWS LOC 0  MEWS Score 2  MEWS Score Color Yellow  Assess: if the MEWS score is Yellow or Red  Were vital signs accurate and taken at a resting state? Yes  Does the patient meet 2 or more of the SIRS criteria? No  MEWS guidelines implemented  Yes, yellow  Treat  MEWS Interventions Considered administering scheduled or prn medications/treatments as ordered  Take Vital Signs  Increase Vital Sign Frequency  Yellow: Q2hr x1, continue Q4hrs until patient remains green for 12hrs  Escalate  MEWS: Escalate Yellow: Discuss with charge nurse and consider notifying provider and/or RRT  Notify: Charge Nurse/RN  Name of Charge Nurse/RN Notified Education Officer, Community  Provider Notification  Provider Name/Title Jesus NP  Date Provider Notified 04/20/24  Time Provider Notified 2240  Method of Notification Page  Notification Reason Other (Comment) (yellow mews)  Provider response See new orders  Date of Provider Response 04/20/24  Time of Provider Response 2245  Assess: SIRS CRITERIA  SIRS Temperature  0  SIRS Respirations  0  SIRS Pulse 0  SIRS WBC 0  SIRS Score Sum  0    "

## 2024-04-21 DIAGNOSIS — I639 Cerebral infarction, unspecified: Secondary | ICD-10-CM | POA: Diagnosis not present

## 2024-04-21 LAB — RENAL FUNCTION PANEL
Albumin: 3.7 g/dL (ref 3.5–5.0)
Anion gap: 19 — ABNORMAL HIGH (ref 5–15)
BUN: 40 mg/dL — ABNORMAL HIGH (ref 8–23)
CO2: 22 mmol/L (ref 22–32)
Calcium: 9.2 mg/dL (ref 8.9–10.3)
Chloride: 96 mmol/L — ABNORMAL LOW (ref 98–111)
Creatinine, Ser: 8.58 mg/dL — ABNORMAL HIGH (ref 0.61–1.24)
GFR, Estimated: 6 mL/min — ABNORMAL LOW
Glucose, Bld: 113 mg/dL — ABNORMAL HIGH (ref 70–99)
Phosphorus: 4.9 mg/dL — ABNORMAL HIGH (ref 2.5–4.6)
Potassium: 3.6 mmol/L (ref 3.5–5.1)
Sodium: 137 mmol/L (ref 135–145)

## 2024-04-21 LAB — CBC
HCT: 35.5 % — ABNORMAL LOW (ref 39.0–52.0)
Hemoglobin: 11.2 g/dL — ABNORMAL LOW (ref 13.0–17.0)
MCH: 28.8 pg (ref 26.0–34.0)
MCHC: 31.5 g/dL (ref 30.0–36.0)
MCV: 91.3 fL (ref 80.0–100.0)
Platelets: 197 K/uL (ref 150–400)
RBC: 3.89 MIL/uL — ABNORMAL LOW (ref 4.22–5.81)
RDW: 13.9 % (ref 11.5–15.5)
WBC: 4.3 K/uL (ref 4.0–10.5)
nRBC: 0 % (ref 0.0–0.2)

## 2024-04-21 LAB — GLUCOSE, CAPILLARY: Glucose-Capillary: 135 mg/dL — ABNORMAL HIGH (ref 70–99)

## 2024-04-21 MED ORDER — METOPROLOL SUCCINATE ER 25 MG PO TB24
25.0000 mg | ORAL_TABLET | Freq: Every day | ORAL | Status: DC
  Administered 2024-04-21 – 2024-04-24 (×4): 25 mg via ORAL
  Filled 2024-04-21 (×4): qty 1

## 2024-04-21 MED ORDER — HYDROCORTISONE 1 % EX CREA
1.0000 | TOPICAL_CREAM | Freq: Three times a day (TID) | CUTANEOUS | Status: DC | PRN
  Administered 2024-04-21: 1 via TOPICAL
  Filled 2024-04-21: qty 28

## 2024-04-21 MED ORDER — GUAIFENESIN-DM 100-10 MG/5ML PO SYRP
5.0000 mL | ORAL_SOLUTION | ORAL | Status: DC | PRN
  Administered 2024-04-21 – 2024-04-24 (×6): 5 mL via ORAL
  Filled 2024-04-21 (×5): qty 5

## 2024-04-21 NOTE — Plan of Care (Signed)
 " Problem: Education: Goal: Knowledge of General Education information will improve Description: Including pain rating scale, medication(s)/side effects and non-pharmacologic comfort measures Outcome: Progressing   Problem: Health Behavior/Discharge Planning: Goal: Ability to manage health-related needs will improve Outcome: Progressing   Problem: Clinical Measurements: Goal: Ability to maintain clinical measurements within normal limits will improve Outcome: Progressing Goal: Will remain free from infection Outcome: Progressing Goal: Diagnostic test results will improve Outcome: Progressing Goal: Respiratory complications will improve Outcome: Progressing Goal: Cardiovascular complication will be avoided Outcome: Progressing   Problem: Activity: Goal: Risk for activity intolerance will decrease Outcome: Progressing   Problem: Nutrition: Goal: Adequate nutrition will be maintained Outcome: Progressing   Problem: Coping: Goal: Level of anxiety will decrease Outcome: Progressing   Problem: Elimination: Goal: Will not experience complications related to bowel motility Outcome: Progressing Goal: Will not experience complications related to urinary retention Outcome: Progressing   Problem: Pain Managment: Goal: General experience of comfort will improve and/or be controlled Outcome: Progressing   Problem: Safety: Goal: Ability to remain free from injury will improve Outcome: Progressing   Problem: Skin Integrity: Goal: Risk for impaired skin integrity will decrease Outcome: Progressing   Problem: Education: Goal: Knowledge of disease or condition will improve Outcome: Progressing Goal: Knowledge of secondary prevention will improve (MUST DOCUMENT ALL) Outcome: Progressing Goal: Knowledge of patient specific risk factors will improve (DELETE if not current risk factor) Outcome: Progressing   Problem: Ischemic Stroke/TIA Tissue Perfusion: Goal: Complications of  ischemic stroke/TIA will be minimized Outcome: Progressing   Problem: Coping: Goal: Will verbalize positive feelings about self Outcome: Progressing Goal: Will identify appropriate support needs Outcome: Progressing   Problem: Health Behavior/Discharge Planning: Goal: Ability to manage health-related needs will improve Outcome: Progressing Goal: Goals will be collaboratively established with patient/family Outcome: Progressing   Problem: Self-Care: Goal: Ability to participate in self-care as condition permits will improve Outcome: Progressing Goal: Verbalization of feelings and concerns over difficulty with self-care will improve Outcome: Progressing Goal: Ability to communicate needs accurately will improve Outcome: Progressing   Problem: Nutrition: Goal: Risk of aspiration will decrease Outcome: Progressing Goal: Dietary intake will improve Outcome: Progressing   Problem: Health Behavior/Discharge Planning: Goal: Ability to manage health-related needs will improve Outcome: Progressing   Problem: Education: Goal: Knowledge of General Education information will improve Description: Including pain rating scale, medication(s)/side effects and non-pharmacologic comfort measures Outcome: Progressing   Problem: Clinical Measurements: Goal: Ability to maintain clinical measurements within normal limits will improve Outcome: Progressing Goal: Will remain free from infection Outcome: Progressing Goal: Diagnostic test results will improve Outcome: Progressing Goal: Respiratory complications will improve Outcome: Progressing Goal: Cardiovascular complication will be avoided Outcome: Progressing   Problem: Activity: Goal: Risk for activity intolerance will decrease Outcome: Progressing   Problem: Nutrition: Goal: Adequate nutrition will be maintained Outcome: Progressing   Problem: Coping: Goal: Level of anxiety will decrease Outcome: Progressing   Problem:  Elimination: Goal: Will not experience complications related to bowel motility Outcome: Progressing Goal: Will not experience complications related to urinary retention Outcome: Progressing   Problem: Pain Managment: Goal: General experience of comfort will improve and/or be controlled Outcome: Progressing   Problem: Safety: Goal: Ability to remain free from injury will improve Outcome: Progressing   Problem: Skin Integrity: Goal: Risk for impaired skin integrity will decrease Outcome: Progressing   Problem: Education: Goal: Knowledge of disease or condition will improve Outcome: Progressing Goal: Knowledge of secondary prevention will improve (MUST DOCUMENT ALL) Outcome: Progressing Goal: Knowledge of patient specific risk  factors will improve (DELETE if not current risk factor) Outcome: Progressing   Problem: Ischemic Stroke/TIA Tissue Perfusion: Goal: Complications of ischemic stroke/TIA will be minimized Outcome: Progressing   Problem: Coping: Goal: Will verbalize positive feelings about self Outcome: Progressing Goal: Will identify appropriate support needs Outcome: Progressing   Problem: Health Behavior/Discharge Planning: Goal: Ability to manage health-related needs will improve Outcome: Progressing Goal: Goals will be collaboratively established with patient/family Outcome: Progressing   Problem: Self-Care: Goal: Ability to participate in self-care as condition permits will improve Outcome: Progressing Goal: Verbalization of feelings and concerns over difficulty with self-care will improve Outcome: Progressing Goal: Ability to communicate needs accurately will improve Outcome: Progressing   Problem: Nutrition: Goal: Risk of aspiration will decrease Outcome: Progressing Goal: Dietary intake will improve Outcome: Progressing   "

## 2024-04-21 NOTE — Progress Notes (Addendum)
 " PROGRESS NOTE    [name] [name]  FMW:[id number] DOB: [birth year] DOA: 04/18/2024 PCP: Tobie Suzzane POUR, MD   Brief Narrative:   74 y.o. male,  with medical history significant of with history of asthma, CHF, depression, diabetes mellitus type 2, ESRD on hemodialysis Monday Wednesday Friday, CAD status post LAD PCI in 2023, chronic systolic CHF. - Patient followed by renal transplant plant team at [facility], and he was referred for cardiac clearance, he went for cardiac cath at [facility] 04/17/2024, which did show high risk CAD, with ostial/proximal LAD focal calcification 90% stenosis, with large OMI with ostial 90% stenosis, options were for PCI to LAD, and OM medical treatment, versus CABG.  Patient presented to [facility] ED on 04/18/2025 due to complaints of weakness, presents to ED with right-sided weakness  Diagnosed with acute left internal capsule CVA.  Neurology consulted.  Nephrology consulted for ESRD management.  Pending placement to CIR.  Medically ready  Assessment & Plan:  Principal Problem:   Stroke (cerebrum) (HCC) Active Problems:   ESRD (end stage renal disease) (HCC)   Type 2 diabetes mellitus in patient with obesity (HCC)   Obesity (BMI 30-39.9)   Heart failure with improved ejection fraction (HFimpEF) (HCC)   HLD (hyperlipidemia)   Hemodialysis-associated hypotension   Acute ischemic CVA,likely cardioembolic, POA: MRI of the brain showed evidence of acute infarct in the posterior limb of the left internal capsule. Was not a candidate for tPA as he recently had cardiac catheterization procedure done on 04/17/2024. - Continue with aspirin , Plavix  and statin. -Follow-up transthoracic echocardiogram-Pending - Continue to monitor on telemetry - PT/OT/speech therapy consultations placed - Discussed with Dr Shelton and recommendation is to continue with DAPT and statin and outpt neuro follow up.  -Needs CIR on discharge.  Coronary artery disease status post  stenting, POA: Prior history of stenting.  Cardiac catheterization procedure done via right radial artery on 04/17/2024 showed high risk lesions.   Multidisciplinary cardiology team has decided to go with PCI. Timing will be decided later. Continue with aspirin , statin and Plavix   Class I obesity: Dietary modifications and physical activity to facilitate weight loss. He may be a candidate for semaglutide (additional benefit in patients with CAD)  ESRD,POA: On HD  MWF -Nephrology consulted. -HD done on 04/19/24. Next HD on 1/3 -Rest of the management as per nephrology  Right thyroid  nodule,POA: -Outpatient thyroid  ultrasound  Type 2 diabetes mellitus,POA: -Well controlled -On no medications  Essential Hypertension, POA: Ordered as needed intravenous hydralazine  every 4 hours as needed for BP greater than 160.  Resumed home dose of metoprolol  succinate 25 mg p.o. daily.  Continue to monitor blood pressure closely.  Disposition: Pending placement to CIR.  Medically ready.    DVT prophylaxis: heparin  injection 5,000 Units Start: 04/19/24 0600     Code Status: Full Code Family Communication: None at the bedside Status is: Inpatient Remains inpatient appropriate because: Acute CVA    Subjective:  Events overnight.  She comfortably in the bed.  He thinks that his right arm strength is getting better now.  He will be hemodialyzed today.  Awaiting placement to CIR.  We discussed about his high blood pressure.  He said that he had high blood pressure in the past but since he started dialysis, blood pressure has been on the lower side so he is not on any BP medications at home.  Examination:  General exam: Appears calm and comfortable  Respiratory system: Clear to auscultation. Respiratory effort normal.  Cardiovascular system: S1 & S2 heard, RRR. No JVD, murmurs, rubs, gallops or clicks. No pedal edema. Gastrointestinal system: Abdomen is nondistended, soft and nontender. No  organomegaly or masses felt. Normal bowel sounds heard. Central nervous system: Alert and oriented. No focal neurological deficits. Extremities: Power 4/5 on the right arm and right leg, 5/5 on the left arm and left leg Skin: No rashes, lesions or ulcers Psychiatry: Judgement and insight appear normal. Mood & affect appropriate.      Diet Orders (From admission, onward)     Start     Ordered   04/18/24 1827  Diet renal with fluid restriction Fluid restriction: 1200 mL Fluid; Room service appropriate? Yes; Fluid consistency: Thin  Diet effective now       Question Answer Comment  Fluid restriction: 1200 mL Fluid   Room service appropriate? Yes   Fluid consistency: Thin      04/18/24 1826            Objective: Vitals:   04/20/24 2238 04/21/24 0015 04/21/24 0450 04/21/24 0724  BP: (!) 204/82 138/73 138/87   Pulse: 68 86 80   Resp:  19 19   Temp:  98.9 F (37.2 C) 98.5 F (36.9 C)   TempSrc:      SpO2:  96% 98% 98%  Weight:      Height:        Intake/Output Summary (Last 24 hours) at 04/21/2024 0751 Last data filed at 04/21/2024 0558 Gross per 24 hour  Intake 1200 ml  Output 100 ml  Net 1100 ml   Filed Weights   04/18/24 1149 04/19/24 1630 04/19/24 1945  Weight: 100.6 kg 103.8 kg 101.8 kg    Scheduled Meds:  aspirin  EC  81 mg Oral Daily   atorvastatin   80 mg Oral Daily   Chlorhexidine  Gluconate Cloth  6 each Topical Q0600   clopidogrel   75 mg Oral Daily   fluticasone  furoate-vilanterol  1 puff Inhalation Daily   heparin   5,000 Units Subcutaneous Q8H   metoprolol  succinate  25 mg Oral Daily   pantoprazole   40 mg Oral Daily   sevelamer  carbonate  800 mg Oral TID WC   Continuous Infusions:    Nutritional status     Body mass index is 30.44 kg/m.  Data Reviewed:   CBC: Recent Labs  Lab 04/18/24 1250 04/18/24 1257 04/19/24 0516  WBC 4.4  --  4.5  HGB 12.2* 12.6* 11.1*  HCT 39.4 37.0* 35.5*  MCV 94.0  --  92.7  PLT 97*  --  178   Basic  Metabolic Panel: Recent Labs  Lab 04/18/24 1250 04/18/24 1257 04/19/24 0516 04/19/24 0750  NA 143 139 140  --   K 4.3 4.1 4.6  --   CL 98 103 98  --   CO2 23  --  27  --   GLUCOSE 95 97 71  --   BUN 49* 48* 53*  --   CREATININE 10.60* 11.70* 11.60*  --   CALCIUM  10.0  --  9.2  --   PHOS  --   --   --  8.4*   GFR: Estimated Creatinine Clearance: 6.9 mL/min (A) (by C-G formula based on SCr of 11.6 mg/dL (H)). Liver Function Tests: Recent Labs  Lab 04/18/24 1250 04/19/24 0516  AST 22 20  ALT 15 11  ALKPHOS 95 86  BILITOT 0.5 0.5  PROT 7.3 6.0*  ALBUMIN 4.3 3.7   No results for input(s): LIPASE, AMYLASE in the  last 168 hours. No results for input(s): AMMONIA in the last 168 hours. Coagulation Profile: No results for input(s): INR, PROTIME in the last 168 hours. Cardiac Enzymes: No results for input(s): CKTOTAL, CKMB, CKMBINDEX, TROPONINI in the last 168 hours. BNP (last 3 results) No results for input(s): PROBNP in the last 8760 hours. HbA1C: Recent Labs    04/18/24 1250  HGBA1C 5.9*   CBG: Recent Labs  Lab 04/18/24 1211 04/21/24 0013  GLUCAP 79 135*   Lipid Profile: Recent Labs    04/19/24 0516  CHOL 102  HDL 45  LDLCALC 46  TRIG 57  CHOLHDL 2.3   Thyroid  Function Tests: No results for input(s): TSH, T4TOTAL, FREET4, T3FREE, THYROIDAB in the last 72 hours. Anemia Panel: No results for input(s): VITAMINB12, FOLATE, FERRITIN, TIBC, IRON, RETICCTPCT in the last 72 hours. Sepsis Labs: No results for input(s): PROCALCITON, LATICACIDVEN in the last 168 hours.  No results found for this or any previous visit (from the past 240 hours).       Radiology Studies: ECHOCARDIOGRAM COMPLETE Result Date: 04/20/2024    ECHOCARDIOGRAM REPORT   Patient Name:   [name] [name] Date of Exam: 04/19/2024 Medical Rec #:  [id number]      Height:       72.0 in Accession #:    [id number]     Weight:       221.8 lb Date of Birth:   1951/02/27     BSA:          2.226 m Patient Age:    73 years       BP:           178/82 mmHg Patient Gender: M              HR:           92 bpm. Exam Location:  [facility] Procedure: 2D Echo, Cardiac Doppler, Color Doppler and Saline Contrast Bubble            Study (Both Spectral and Color Flow Doppler were utilized during            procedure). Indications:    Stroke l63.9  History:        Patient has prior history of Echocardiogram examinations, most                 recent 01/06/2023. CHF, CAD and Previous Myocardial Infarction;                 Risk Factors:Non-Smoker, Dyslipidemia, Diabetes and                 Hypertension. ESRD on dialysis.  Sonographer:    Aida Pizza RCS Referring Phys: [phone] DAWOOD S ELGERGAWY IMPRESSIONS  1. A small linear mobile echodensity attached to the papillary muscle is seen flickering in and out of the LV cavity. Unclear if it is artifact due to suboptimal image quality. Best seen in images 78 and 79. Consider further evaluation with TEE.SABRA Left ventricular ejection fraction, by estimation, is 60 to 65%. The left ventricle has normal function. The left ventricle has no regional wall motion abnormalities. Left ventricular diastolic parameters are consistent with Grade I diastolic dysfunction (impaired relaxation).  2. Right ventricular systolic function is normal. The right ventricular size is normal. Tricuspid regurgitation signal is inadequate for assessing PA pressure.  3. Left atrial size was moderately dilated.  4. Right atrial size was mildly dilated.  5. The mitral valve is normal in structure. No evidence  of mitral valve regurgitation. No evidence of mitral stenosis.  6. The aortic valve was not well visualized. Aortic valve regurgitation is not visualized. No aortic stenosis is present.  7. The inferior vena cava is dilated in size with >50% respiratory variability, suggesting right atrial pressure of 8 mmHg.  8. Agitated saline contrast bubble study was negative, with no  evidence of any interatrial shunt. FINDINGS  Left Ventricle: A small linear mobile echodensity attached to the papillary muscle is seen flickering in and out of the LV cavity. Unclear if it is artifact due to suboptimal image quality. Best seen in images 78 and 79. Consider further evaluation with  TEE. Left ventricular ejection fraction, by estimation, is 60 to 65%. The left ventricle has normal function. The left ventricle has no regional wall motion abnormalities. Strain was performed and the global longitudinal strain is indeterminate. The left ventricular internal cavity size was normal in size. There is no left ventricular hypertrophy. Left ventricular diastolic parameters are consistent with Grade I diastolic dysfunction (impaired relaxation). Normal left ventricular filling pressure. Right Ventricle: The right ventricular size is normal. No increase in right ventricular wall thickness. Right ventricular systolic function is normal. Tricuspid regurgitation signal is inadequate for assessing PA pressure. Left Atrium: Left atrial size was moderately dilated. Right Atrium: Right atrial size was mildly dilated. Pericardium: There is no evidence of pericardial effusion. Mitral Valve: The mitral valve is normal in structure. No evidence of mitral valve regurgitation. No evidence of mitral valve stenosis. Tricuspid Valve: The tricuspid valve is not well visualized. Tricuspid valve regurgitation is not demonstrated. No evidence of tricuspid stenosis. Aortic Valve: The aortic valve was not well visualized. Aortic valve regurgitation is not visualized. No aortic stenosis is present. Aortic valve mean gradient measures 8.0 mmHg. Aortic valve peak gradient measures 14.4 mmHg. Aortic valve area, by VTI measures 2.77 cm. Pulmonic Valve: The pulmonic valve was not well visualized. Pulmonic valve regurgitation is not visualized. No evidence of pulmonic stenosis. Aorta: The aortic root is normal in size and structure.  Venous: The inferior vena cava is dilated in size with greater than 50% respiratory variability, suggesting right atrial pressure of 8 mmHg. IAS/Shunts: No atrial level shunt detected by color flow Doppler. Agitated saline contrast was given intravenously to evaluate for intracardiac shunting. Agitated saline contrast bubble study was negative, with no evidence of any interatrial shunt. Additional Comments: 3D was performed not requiring image post processing on an independent workstation and was indeterminate.  LEFT VENTRICLE PLAX 2D LVIDd:         5.00 cm      Diastology LVIDs:         3.50 cm      LV e' medial:    7.96 cm/s LV PW:         1.10 cm      LV E/e' medial:  12.5 LV IVS:        1.10 cm      LV e' lateral:   10.60 cm/s LVOT diam:     2.20 cm      LV E/e' lateral: 9.4 LV SV:         116 LV SV Index:   52 LVOT Area:     3.80 cm  LV Volumes (MOD) LV vol d, MOD A2C: 176.0 ml LV vol d, MOD A4C: 156.0 ml LV vol s, MOD A2C: 61.9 ml LV vol s, MOD A4C: 51.2 ml LV SV MOD A2C:     114.1 ml  LV SV MOD A4C:     156.0 ml LV SV MOD BP:      109.5 ml RIGHT VENTRICLE RV S prime:     21.60 cm/s TAPSE (M-mode): 3.2 cm LEFT ATRIUM              Index        RIGHT ATRIUM           Index LA diam:        3.80 cm  1.71 cm/m   RA Area:     23.50 cm LA Vol (A2C):   118.0 ml 53.00 ml/m  RA Volume:   70.20 ml  31.53 ml/m LA Vol (A4C):   82.6 ml  37.10 ml/m LA Biplane Vol: 101.0 ml 45.37 ml/m  AORTIC VALVE AV Area (Vmax):    3.02 cm AV Area (Vmean):   2.94 cm AV Area (VTI):     2.77 cm AV Vmax:           190.00 cm/s AV Vmean:          133.000 cm/s AV VTI:            0.418 m AV Peak Grad:      14.4 mmHg AV Mean Grad:      8.0 mmHg LVOT Vmax:         151.00 cm/s LVOT Vmean:        103.000 cm/s LVOT VTI:          0.305 m LVOT/AV VTI ratio: 0.73  AORTA Ao Root diam: 3.50 cm MITRAL VALVE MV Area (PHT): 2.32 cm     SHUNTS MV Decel Time: 327 msec     Systemic VTI:  0.30 m MV E velocity: 99.60 cm/s   Systemic Diam: 2.20 cm MV A  velocity: 153.00 cm/s MV E/A ratio:  0.65 Vishnu Priya Mallipeddi Electronically signed by Diannah Late Mallipeddi Signature Date/Time: 04/20/2024/3:38:57 PM    Final            LOS: 3 days   Time spent= 35 mins    Deliliah Room, MD [facility]  If 7PM-7AM, please contact night-coverage  04/21/2024, 7:51 AM  "

## 2024-04-21 NOTE — Progress Notes (Signed)
 Pt arrived to unit for tx. Initiated tx using pt's L AVF Pt completed tx with no issues removing 2L Pt left unit with no s/s of distress  04/21/24 1723  Vitals  Pulse Rate 66  Resp 16  BP 133/65  SpO2 99 %  O2 Device Room Air  Oxygen  Therapy  Patient Activity (if Appropriate) In bed  Pulse Oximetry Type Continuous  Oximetry Probe Site Changed No  During Treatment Monitoring  Blood Flow Rate (mL/min) 0 mL/min  Arterial Pressure (mmHg) 29.7 mmHg  Venous Pressure (mmHg) -36.16 mmHg  TMP (mmHg) 21.82 mmHg  Ultrafiltration Rate (mL/min) 939 mL/min  Dialysate Flow Rate (mL/min) 300 ml/min  Dialysate Potassium Concentration 2  Dialysate Calcium  Concentration 2.5  Duration of HD Treatment -hour(s) 3 hour(s)  Cumulative Fluid Removed (mL) per Treatment  2000.2

## 2024-04-21 NOTE — Progress Notes (Signed)
 Physical Therapy Treatment Patient Details Name: [name] [name] MRN: [id number] DOB: 1951/01/22 Today's Date: 04/21/2024   History of Present Illness [name] [name]  is a 74 y.o. male,  with medical history significant of with history of asthma, CHF, depression, diabetes mellitus type 2, ESRD on hemodialysis Monday Wednesday Friday, CAD status post LAD PCI in 2023, chronic systolic CHF.  - Patient followed by renal transplant plant team at [facility], and he was referred for cardiac clearance, he went for cardiac cath at [facility] 04/17/2024, which did show high risk CAD, with ostial/proximal LAD focal calcification 90% stenosis, with large OMI with ostial 90% stenosis, options were for PCI to LAD, and OM medical treatment, versus CABG.  - Patient presents to ED today due to complaints of weakness, presents to ED with right-sided weakness, reports he finished his cardiac cath he felt some tingling numbness in the right upper arm which he thought initially secondary to his cardiac cath as it was accessed via right radial, but reports this morning he had significant weakness when he was standing up to go for dialysis and his right arm and right leg, unable to walk which prompted him to come to ED, reports some improvement in his movement currently.  - in  ED, brain MRI was significant for acute CVA, so patient admitted for further workup.    PT Comments  Patient agreeable and motivated for therapy. Patient demonstrates fair/good return for completing BLE ROM/strengthening exercises while seated at bedside, slightly increased endurance/distance for gait training with occasional scissoring, incoordination of RLE and limited mostly due to fatigue. Patient tolerated sitting up in chair after therapy. Patient will benefit from continued skilled physical therapy in hospital and recommended venue below to increase strength, balance, endurance for safe ADLs and gait.       If plan is discharge home, recommend the  following: A little help with walking and/or transfers;A little help with bathing/dressing/bathroom;Assistance with cooking/housework;Assist for transportation;Help with stairs or ramp for entrance   Can travel by private vehicle        Equipment Recommendations  None recommended by PT    Recommendations for Other Services       Precautions / Restrictions Precautions Precautions: Fall Recall of Precautions/Restrictions: Intact Restrictions Weight Bearing Restrictions Per Provider Order: No     Mobility  Bed Mobility Overal bed mobility: Needs Assistance Bed Mobility: Supine to Sit     Supine to sit: Supervision, Contact guard     General bed mobility comments: slightly labored movement    Transfers Overall transfer level: Needs assistance Equipment used: Rolling walker (2 wheels) Transfers: Sit to/from Stand, Bed to chair/wheelchair/BSC Sit to Stand: Contact guard assist   Step pivot transfers: Min assist, Contact guard assist       General transfer comment: increased time, labored movement    Ambulation/Gait Ambulation/Gait assistance: Contact guard assist, Min assist Gait Distance (Feet): 45 Feet Assistive device: Rolling walker (2 wheels) Gait Pattern/deviations: Step-through pattern, Decreased step length - right, Decreased step length - left, Decreased stride length, Shuffle, Scissoring Gait velocity: Dec     General Gait Details: slightly labored movement with occasional scissoring mild dragging of RLE using RW, can take a few steps in room without AD, but at higher risk for falls due to RLE weakness   Stairs             Wheelchair Mobility     Tilt Bed    Modified Rankin (Stroke Patients Only)  Balance Overall balance assessment: Needs assistance Sitting-balance support: Feet supported, No upper extremity supported Sitting balance-Leahy Scale: Good Sitting balance - Comments: seated at EOB   Standing balance support: During  functional activity, No upper extremity supported Standing balance-Leahy Scale: Poor Standing balance comment: fair/poor without AD, fair using RW                            Communication Communication Communication: No apparent difficulties  Cognition Arousal: Alert Behavior During Therapy: WFL for tasks assessed/performed                             Following commands: Intact      Cueing Cueing Techniques: Verbal cues  Exercises General Exercises - Lower Extremity Long Arc Quad: Seated, AROM, Strengthening, Both, 10 reps Hip Flexion/Marching: Seated, AROM, Strengthening, Both, 10 reps Toe Raises: Seated, AROM, Strengthening, Right, Both, 10 reps Heel Raises: Seated, Right, Both, 10 reps, AROM, Strengthening    General Comments        Pertinent Vitals/Pain Pain Assessment Pain Assessment: No/denies pain    Home Living                          Prior Function            PT Goals (current goals can now be found in the care plan section) Acute Rehab PT Goals Patient Stated Goal: Return home following short IPR stay PT Goal Formulation: With patient Time For Goal Achievement: 05/03/24 Potential to Achieve Goals: Good Progress towards PT goals: Progressing toward goals    Frequency    Min 4X/week      PT Plan      Co-evaluation              AM-PAC PT 6 Clicks Mobility   Outcome Measure  Help needed turning from your back to your side while in a flat bed without using bedrails?: None Help needed moving from lying on your back to sitting on the side of a flat bed without using bedrails?: None Help needed moving to and from a bed to a chair (including a wheelchair)?: A Little Help needed standing up from a chair using your arms (e.g., wheelchair or bedside chair)?: A Little Help needed to walk in hospital room?: A Little Help needed climbing 3-5 steps with a railing? : A Lot 6 Click Score: 19    End of Session  Equipment Utilized During Treatment: Gait belt Activity Tolerance: Patient tolerated treatment well;Patient limited by fatigue Patient left: in chair;with call bell/phone within reach Nurse Communication: Mobility status PT Visit Diagnosis: Other abnormalities of gait and mobility (R26.89);Other symptoms and signs involving the nervous system (R29.898);Muscle weakness (generalized) (M62.81);Hemiplegia and hemiparesis Hemiplegia - Right/Left: Right Hemiplegia - dominant/non-dominant: Dominant Hemiplegia - caused by: Cerebral infarction     Time: 8964-8941 PT Time Calculation (min) (ACUTE ONLY): 23 min  Charges:    $Gait Training: 8-22 mins $Therapeutic Exercise: 8-22 mins PT General Charges $$ ACUTE PT VISIT: 1 Visit                     1:17 PM, 04/21/2024 Lynwood Music, MPT Physical Therapist with [facility] [phone] office [phone] mobile phone

## 2024-04-22 DIAGNOSIS — I639 Cerebral infarction, unspecified: Secondary | ICD-10-CM | POA: Diagnosis not present

## 2024-04-22 NOTE — Plan of Care (Signed)
  Problem: Education: Goal: Knowledge of General Education information will improve Description: Including pain rating scale, medication(s)/side effects and non-pharmacologic comfort measures Outcome: Progressing   Problem: Clinical Measurements: Goal: Ability to maintain clinical measurements within normal limits will improve Outcome: Progressing Goal: Respiratory complications will improve Outcome: Progressing   

## 2024-04-22 NOTE — Progress Notes (Signed)
 " PROGRESS NOTE    [name] [name]  FMW:[id number] DOB: 1950/09/28 DOA: 04/18/2024 PCP: Tobie Suzzane POUR, MD   Brief Narrative:   74 y.o. male,  with medical history significant of with history of asthma, CHF, depression, diabetes mellitus type 2, ESRD on hemodialysis Monday Wednesday Friday, CAD status post LAD PCI in 2023, chronic systolic CHF. - Patient followed by renal transplant plant team at [facility], and he was referred for cardiac clearance, he went for cardiac cath at [facility] 04/17/2024, which did show high risk CAD, with ostial/proximal LAD focal calcification 90% stenosis, with large OMI with ostial 90% stenosis, options were for PCI to LAD, and OM medical treatment, versus CABG.  Patient presented to [facility] ED on 04/18/2025 due to complaints of weakness, presents to ED with right-sided weakness  Diagnosed with acute left internal capsule CVA.  Neurology consulted.  Nephrology consulted for ESRD management.  Pending placement to CIR.  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 Was not a candidate for tPA as he recently had cardiac catheterization procedure done on 04/17/2024. - Continue with aspirin , Plavix  and statin. -Follow-up transthoracic echocardiogram-EF 60 to 65% with grade 1 diastolic dysfunction, no significant valvular normalities.  No interatrial shunt. - Continue to monitor on telemetry - PT/OT/speech therapy consultations placed - Discussed with Dr Shelton and  recommendation is to continue with DAPT and statin and outpt neuro follow up.  -Needs CIR on discharge.  Coronary artery disease status post stenting, POA: Prior history of stenting.  Cardiac catheterization procedure done via right radial artery on 04/17/2024 showed high risk lesions.   Multidisciplinary cardiology team has decided to go with PCI. Timing will be decided later. Continue with aspirin , statin and Plavix   Class I obesity: Dietary modifications and physical activity to facilitate weight loss. He may be a candidate for semaglutide (additional benefit in patients with CAD)  ESRD,POA: On HD  MWF -Nephrology consulted. -HD done in the hospital on 04/19/24 as well as 04/21/2024 -Rest of the management as per nephrology  Right thyroid  nodule,POA: -Outpatient thyroid  ultrasound  Type 2 diabetes mellitus,POA: -Well controlled -On no medications  Essential Hypertension, POA: Ordered as needed intravenous hydralazine  every 4 hours as needed for BP greater than 160.  Resumed home dose of metoprolol  succinate 25 mg p.o. daily.  Continue to monitor blood pressure closely.  Disposition: Pending placement to CIR.  Medically ready.    DVT prophylaxis: heparin  injection 5,000 Units Start: 04/19/24 0600     Code Status: Full Code Family Communication: None at the bedside Status is: Inpatient Remains inpatient appropriate because: Acute CVA    Subjective:  No acute events overnight.  Hemodialysis was done yesterday without complications.  He feels that his right arm strength is consistently getting better.  He did walk with physical therapist yesterday.  Awaiting placement to CIR.  Examination:  General exam: Appears calm and comfortable  Respiratory system: Clear to auscultation. Respiratory effort normal. Cardiovascular system: S1 & S2 heard, RRR. No JVD, murmurs, rubs, gallops or clicks. No  pedal edema. Gastrointestinal system: Abdomen is nondistended, soft and nontender. No  organomegaly or masses felt. Normal bowel sounds heard. Central nervous system: Alert and oriented. No focal neurological deficits. Extremities: Power 4/5 on the right arm and right leg, 5/5 on the left arm and left leg Skin: No rashes, lesions or ulcers Psychiatry: Judgement and insight appear normal. Mood & affect appropriate.      Diet Orders (From admission, onward)     Start     Ordered   04/18/24 1827  Diet renal with fluid restriction Fluid restriction: 1200 mL Fluid; Room service appropriate? Yes; Fluid consistency: Thin  Diet effective now       Question Answer Comment  Fluid restriction: 1200 mL Fluid   Room service appropriate? Yes   Fluid consistency: Thin      04/18/24 1826            Objective: Vitals:   04/21/24 1723 04/21/24 1919 04/21/24 2356 04/22/24 0405  BP: 133/65 (!) 147/71 (!) 154/75 135/69  Pulse: 66 73 68 81  Resp: 16 16 17 16   Temp:  99.2 F (37.3 C) 99 F (37.2 C) 98.8 F (37.1 C)  TempSrc:  Oral Oral Oral  SpO2: 99% 98% 98% 98%  Weight:      Height:        Intake/Output Summary (Last 24 hours) at 04/22/2024 0800 Last data filed at 04/21/2024 1723 Gross per 24 hour  Intake 480 ml  Output 2000 ml  Net -1520 ml   Filed Weights   04/19/24 1945 04/21/24 1358 04/21/24 1720  Weight: 101.8 kg 107.3 kg 105 kg    Scheduled Meds:  aspirin  EC  81 mg Oral Daily   atorvastatin   80 mg Oral Daily   Chlorhexidine  Gluconate Cloth  6 each Topical Q0600   clopidogrel   75 mg Oral Daily   fluticasone  furoate-vilanterol  1 puff Inhalation Daily   heparin   5,000 Units Subcutaneous Q8H   metoprolol  succinate  25 mg Oral Daily   pantoprazole   40 mg Oral Daily   sevelamer  carbonate  800 mg Oral TID WC   Continuous Infusions:    Nutritional status     Body mass index is 31.39 kg/m.  Data Reviewed:   CBC: Recent Labs  Lab 04/18/24 1250 04/18/24 1257 04/19/24 0516 04/21/24 1442  WBC 4.4  --  4.5 4.3  HGB 12.2* 12.6* 11.1* 11.2*  HCT 39.4  37.0* 35.5* 35.5*  MCV 94.0  --  92.7 91.3  PLT 97*  --  178 197   Basic Metabolic Panel: Recent Labs  Lab 04/18/24 1250 04/18/24 1257 04/19/24 0516 04/19/24 0750 04/21/24 1442  NA 143 139 140  --  137  K 4.3 4.1 4.6  --  3.6  CL 98 103 98  --  96*  CO2 23  --  27  --  22  GLUCOSE 95 97 71  --  113*  BUN 49* 48* 53*  --  40*  CREATININE 10.60* 11.70* 11.60*  --  8.58*  CALCIUM  10.0  --  9.2  --  9.2  PHOS  --   --   --  8.4* 4.9*   GFR: Estimated Creatinine Clearance: 9.5 mL/min (A) (by C-G formula based on SCr of 8.58 mg/dL (H)). Liver Function Tests: Recent Labs  Lab 04/18/24 1250 04/19/24 0516 04/21/24 1442  AST 22 20  --   ALT 15 11  --   ALKPHOS 95 86  --   BILITOT 0.5 0.5  --  PROT 7.3 6.0*  --   ALBUMIN 4.3 3.7 3.7   No results for input(s): LIPASE, AMYLASE in the last 168 hours. No results for input(s): AMMONIA in the last 168 hours. Coagulation Profile: No results for input(s): INR, PROTIME in the last 168 hours. Cardiac Enzymes: No results for input(s): CKTOTAL, CKMB, CKMBINDEX, TROPONINI in the last 168 hours. BNP (last 3 results) No results for input(s): PROBNP in the last 8760 hours. HbA1C: No results for input(s): HGBA1C in the last 72 hours.  CBG: Recent Labs  Lab 04/18/24 1211 04/21/24 0013  GLUCAP 79 135*   Lipid Profile: No results for input(s): CHOL, HDL, LDLCALC, TRIG, CHOLHDL, LDLDIRECT in the last 72 hours.  Thyroid  Function Tests: No results for input(s): TSH, T4TOTAL, FREET4, T3FREE, THYROIDAB in the last 72 hours. Anemia Panel: No results for input(s): VITAMINB12, FOLATE, FERRITIN, TIBC, IRON, RETICCTPCT in the last 72 hours. Sepsis Labs: No results for input(s): PROCALCITON, LATICACIDVEN in the last 168 hours.  No results found for this or any previous visit (from the past 240 hours).       Radiology Studies: No results found.          LOS: 4 days    Time spent= 35 mins    Deliliah Room, MD [facility]  If 7PM-7AM, please contact night-coverage  04/22/2024, 8:00 AM  "

## 2024-04-23 DIAGNOSIS — I639 Cerebral infarction, unspecified: Secondary | ICD-10-CM | POA: Diagnosis not present

## 2024-04-23 LAB — CBC WITH DIFFERENTIAL/PLATELET
Abs Immature Granulocytes: 0.01 K/uL (ref 0.00–0.07)
Basophils Absolute: 0 K/uL (ref 0.0–0.1)
Basophils Relative: 1 %
Eosinophils Absolute: 0.1 K/uL (ref 0.0–0.5)
Eosinophils Relative: 3 %
HCT: 34 % — ABNORMAL LOW (ref 39.0–52.0)
Hemoglobin: 10.7 g/dL — ABNORMAL LOW (ref 13.0–17.0)
Immature Granulocytes: 0 %
Lymphocytes Relative: 15 %
Lymphs Abs: 0.7 K/uL (ref 0.7–4.0)
MCH: 29.2 pg (ref 26.0–34.0)
MCHC: 31.5 g/dL (ref 30.0–36.0)
MCV: 92.6 fL (ref 80.0–100.0)
Monocytes Absolute: 0.3 K/uL (ref 0.1–1.0)
Monocytes Relative: 7 %
Neutro Abs: 3.5 K/uL (ref 1.7–7.7)
Neutrophils Relative %: 74 %
Platelets: 163 K/uL (ref 150–400)
RBC: 3.67 MIL/uL — ABNORMAL LOW (ref 4.22–5.81)
RDW: 13.8 % (ref 11.5–15.5)
WBC: 4.6 K/uL (ref 4.0–10.5)
nRBC: 0 % (ref 0.0–0.2)

## 2024-04-23 LAB — RENAL FUNCTION PANEL
Albumin: 3.6 g/dL (ref 3.5–5.0)
Anion gap: 19 — ABNORMAL HIGH (ref 5–15)
BUN: 54 mg/dL — ABNORMAL HIGH (ref 8–23)
CO2: 22 mmol/L (ref 22–32)
Calcium: 9.6 mg/dL (ref 8.9–10.3)
Chloride: 93 mmol/L — ABNORMAL LOW (ref 98–111)
Creatinine, Ser: 10.4 mg/dL — ABNORMAL HIGH (ref 0.61–1.24)
GFR, Estimated: 5 mL/min — ABNORMAL LOW
Glucose, Bld: 145 mg/dL — ABNORMAL HIGH (ref 70–99)
Phosphorus: 5.2 mg/dL — ABNORMAL HIGH (ref 2.5–4.6)
Potassium: 4.2 mmol/L (ref 3.5–5.1)
Sodium: 135 mmol/L (ref 135–145)

## 2024-04-23 NOTE — Progress Notes (Signed)
 OT Cancellation Note  Patient Details Name: [name] [name] MRN: [id number] DOB: Jan 19, 1951   Cancelled Treatment:    Reason Eval/Treat Not Completed: Patient at procedure or test/ unavailable. Attempted to see patient again today but pt appears to still by at dialysis. Will attempt treatment again tomorrow given inability to see patient today after 2 attempts.   Rielynn Trulson OT, MOT   Jayson Person 04/23/2024, 4:40 PM

## 2024-04-23 NOTE — Plan of Care (Signed)
  Problem: Clinical Measurements: Goal: Diagnostic test results will improve Outcome: Progressing   Problem: Clinical Measurements: Goal: Will remain free from infection Outcome: Progressing

## 2024-04-23 NOTE — Progress Notes (Addendum)
 Admit: 04/18/2024 LOS: 5  3M ESRD MWF via LUE AVF [facility] with acute CVA  Subjective:  Feeling well today.  No concerns.  Feels as though his right upper extremity strength is improving.  01/04 0701 - 01/05 0700 In: 480 [P.O.:480] Out: -   Filed Weights   04/19/24 1945 04/21/24 1358 04/21/24 1720  Weight: 101.8 kg 107.3 kg 105 kg    Scheduled Meds:  aspirin  EC  81 mg Oral Daily   atorvastatin   80 mg Oral Daily   Chlorhexidine  Gluconate Cloth  6 each Topical Q0600   clopidogrel   75 mg Oral Daily   fluticasone  furoate-vilanterol  1 puff Inhalation Daily   heparin   5,000 Units Subcutaneous Q8H   metoprolol  succinate  25 mg Oral Daily   pantoprazole   40 mg Oral Daily   sevelamer  carbonate  800 mg Oral TID WC   Continuous Infusions: PRN Meds:.acetaminophen  **OR** acetaminophen  (TYLENOL ) oral liquid 160 mg/5 mL **OR** acetaminophen , guaiFENesin -dextromethorphan , hydrALAZINE , hydrocortisone  cream, nitroGLYCERIN , senna-docusate  Current Labs: reviewed   Physical Exam:  Blood pressure (!) 158/68, pulse 73, temperature 98.1 F (36.7 C), temperature source Oral, resp. rate 18, height 6' (1.829 m), weight 105 kg, SpO2 95%. GEN: wdwn, sitting in bed, nad ENT: no nasal discharge, mmm EYES: no scleral icterus, eomi CV: normal rate PULM: no iwob, bilateral chest rise ABD: NABS, non-distended SKIN: no rashes or jaundice   A Acute CVA: Per primary neurology, waiting on insurance clearance for CIR ESRD on HD MWF [facility] using AV fistula: Continue dialysis per MWF schedule Hypertension: Medications adjusted around CVA.  BP overall acceptable Anemia: Hemoglobin stable without ESA need CKD-BMD: Hyperphosphatemia noted, sevelamer  was started here and phosphorus now improved History of CAD: h/o PCI, LHC 12/30 showed lesions in need of intervention - PCI vs CABG being discussed by cardiology.   P Dialysis ordered for today Medication Issues; Preferred narcotic agents for pain  control are hydromorphone, fentanyl , and methadone. Morphine should not be used.  Baclofen should be avoided Avoid oral sodium phosphate and magnesium citrate based laxatives / bowel preps    04/23/2024, 9:24 AM  Recent Labs  Lab 04/18/24 1250 04/18/24 1257 04/19/24 0516 04/19/24 0750 04/21/24 1442  NA 143 139 140  --  137  K 4.3 4.1 4.6  --  3.6  CL 98 103 98  --  96*  CO2 23  --  27  --  22  GLUCOSE 95 97 71  --  113*  BUN 49* 48* 53*  --  40*  CREATININE 10.60* 11.70* 11.60*  --  8.58*  CALCIUM  10.0  --  9.2  --  9.2  PHOS  --   --   --  8.4* 4.9*   Recent Labs  Lab 04/18/24 1250 04/18/24 1257 04/19/24 0516 04/21/24 1442  WBC 4.4  --  4.5 4.3  HGB 12.2* 12.6* 11.1* 11.2*  HCT 39.4 37.0* 35.5* 35.5*  MCV 94.0  --  92.7 91.3  PLT 97*  --  178 197

## 2024-04-23 NOTE — Progress Notes (Signed)
 OT Cancellation Note  Patient Details Name: [name] [name] MRN: [id number] DOB: June 27, 1950   Cancelled Treatment:    Reason Eval/Treat Not Completed: Patient at procedure or test/ unavailable. Attempted treatment but pt was not in the room. Will attempt treatment again later as time permits.   Alys Dulak OT, MOT   Jayson Person 04/23/2024, 1:50 PM

## 2024-04-23 NOTE — Progress Notes (Signed)
 Inpatient Rehab Admissions Coordinator:   Following for my colleague Tinnie Keep.  Awaiting updated therapy notes to begin insurance authorization.  OT attempted to see today but pt not in room.  Will need updated PT tomorrow as well if OT not able to see today.   Reche Lowers, PT, DPT Admissions Coordinator [phone] 04/23/2024 3:47 PM

## 2024-04-23 NOTE — TOC Progression Note (Addendum)
 Transition of Care [facility]) - Progression Note    Patient Details  Name: [name] [name] MRN: [id number] Date of Birth: 10-15-1950  Transition of [facility]) CM/SW Contact  Ronnald MARLA Sil, RN Phone Number: 04/23/2024, 2:53 PM  Clinical Narrative:    Patient discussed during interdisciplinary Progression Rounds this morning where Dr Dino confirmed Patient's medical readiness to transition on to Rehab.    CM conducted chart review, including Note from CIR Admit Coord - Tinnie Yvone Cohens (Ph: [phone]) on 04/21/23 stating CIR will continue to follow, and PT Eval on 1/3 with repeat recommendation for Acute Rehab.  CM sent SecureChat Msg to CIR - RN Heron this morning and left VM msg for Admit Coord - Lauren with no response at time of this Note.  CM Team will continue to follow along and assist as appropriate with formulating a safe discharge plan that addresses patient's needs and facilitates optimal outcomes.   Social Drivers of Health (SDOH) Interventions SDOH Screenings   Food Insecurity: No Food Insecurity (04/18/2024)  Housing: Low Risk (04/18/2024)  Transportation Needs: No Transportation Needs (04/18/2024)  Utilities: Not At Risk (04/18/2024)  Alcohol Screen: Low Risk (07/26/2023)  Depression (PHQ2-9): Low Risk (03/13/2024)  Financial Resource Strain: Low Risk (07/26/2023)  Physical Activity: Patient Declined (07/26/2023)  Social Connections: Socially Integrated (04/18/2024)  Stress: No Stress Concern Present (07/26/2023)  Tobacco Use: Low Risk (04/18/2024)  Health Literacy: Adequate Health Literacy (07/26/2023)    Readmission Risk Interventions     No data to display

## 2024-04-23 NOTE — Progress Notes (Signed)
 Due to machine issues. The HD treatment was delayed by over 2 hours. The machine lacked UF functionality. So no fluid was removed from patient.. The patient has been  educated to restrict fluid intake, the patient expressed understanding. Dr. Macel Savant notified.  04/23/24 1630  Vitals  Temp 98.7 F (37.1 C)  Temp Source Oral  BP (!) 160/70  BP Location Right Arm  BP Method Automatic  Patient Position (if appropriate) Lying  Pulse Rate 67  Resp 20  During Treatment Monitoring  Intra-Hemodialysis Comments Tx completed  Post Treatment  Dialyzer Clearance Lightly streaked  Hemodialysis Intake (mL) 0 mL  Liters Processed 60  Fluid Removed (mL) 0 mL  Tolerated HD Treatment Yes  Post-Hemodialysis Comments see notes.  AVG/AVF Arterial Site Held (minutes) 7 minutes  AVG/AVF Venous Site Held (minutes) 7 minutes

## 2024-04-23 NOTE — Progress Notes (Signed)
 " PROGRESS NOTE    [name] [name]  FMW:[id number] DOB: 27-Jan-1951 DOA: 04/18/2024 PCP: Tobie Suzzane POUR, MD   Brief Narrative:   74 y.o. male,  with medical history significant of with history of asthma, CHF, depression, diabetes mellitus type 2, ESRD on hemodialysis Monday Wednesday Friday, CAD status post LAD PCI in 2023, chronic systolic CHF. - Patient followed by renal transplant plant team at [facility], and he was referred for cardiac clearance, he went for cardiac cath at [facility] 04/17/2024, which did show high risk CAD, with ostial/proximal LAD focal calcification 90% stenosis, with large OMI with ostial 90% stenosis, options were for PCI to LAD, and OM medical treatment, versus CABG.  Patient presented to [facility] ED on 04/18/2025 due to complaints of weakness, presents to ED with right-sided weakness  Diagnosed with acute left internal capsule CVA.  Neurology consulted.  Nephrology consulted for ESRD management.  Pending placement to CIR.  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 Was not a candidate for tPA as he recently had cardiac catheterization procedure done on 04/17/2024. - Continue with aspirin , Plavix  and statin. -Follow-up transthoracic echocardiogram-EF 60 to 65% with grade 1 diastolic dysfunction, no significant valvular normalities.  No interatrial shunt. - Continue to monitor on telemetry - PT/OT/speech therapy consultations placed - Discussed with Dr Shelton and  recommendation is to continue with DAPT and statin and outpt neuro follow up.  -Needs CIR on discharge.  Coronary artery disease status post stenting, POA: Prior history of stenting.  Cardiac catheterization procedure done via right radial artery on 04/17/2024 showed high risk lesions.   Multidisciplinary cardiology team has decided to go with PCI. Timing will be decided later. Continue with aspirin , statin and Plavix   Class I obesity: Dietary modifications and physical activity to facilitate weight loss. He may be a candidate for semaglutide (additional benefit in patients with CAD)  ESRD,POA: On HD  MWF -Nephrology consulted. -HD done in the hospital on 04/19/24 as well as 04/21/2024.  Hemodialysis will be done today. -Rest of the management as per nephrology  Right thyroid  nodule,POA: -Outpatient thyroid  ultrasound  Type 2 diabetes mellitus,POA: -Well controlled -On no medications  Essential Hypertension, POA: Ordered as needed intravenous hydralazine  every 4 hours as needed for BP greater than 160.  Resumed home dose of metoprolol  succinate 25 mg p.o. daily.  Continue to monitor blood pressure closely.  Disposition: Pending placement to CIR.  Medically ready.    DVT prophylaxis: heparin  injection 5,000 Units Start: 04/19/24 0600     Code Status: Full Code Family Communication: None at the bedside Status is: Inpatient Remains inpatient appropriate because: Acute CVA    Subjective:  No acute events overnight.  Hemodynamically stable.  Denies any active complaints.  He feels that his right arm strength is consistently getting better.  He has been working with physical therapy.  Examination:  General exam: Appears calm and comfortable  Respiratory system: Clear to auscultation. Respiratory effort normal. Cardiovascular system: S1 & S2 heard, RRR. No JVD, murmurs, rubs, gallops or  clicks. No pedal edema. Gastrointestinal system: Abdomen is nondistended, soft and nontender. No  organomegaly or masses felt. Normal bowel sounds heard. Central nervous system: Alert and oriented. No focal neurological deficits. Extremities: Power 4/5 on the right arm and right leg, 5/5 on the left arm and left leg Skin: No rashes, lesions or ulcers Psychiatry: Judgement and insight appear normal. Mood & affect appropriate.      Diet Orders (From admission, onward)     Start     Ordered   04/18/24 1827  Diet renal with fluid restriction Fluid restriction: 1200 mL Fluid; Room service appropriate? Yes; Fluid consistency: Thin  Diet effective now       Question Answer Comment  Fluid restriction: 1200 mL Fluid   Room service appropriate? Yes   Fluid consistency: Thin      04/18/24 1826            Objective: Vitals:   04/22/24 2311 04/23/24 0346 04/23/24 0717 04/23/24 0821  BP: (!) 146/71 139/69  (!) 158/68  Pulse: 75 64  73  Resp: 18 18    Temp: 99.2 F (37.3 C) 98.8 F (37.1 C)  98.1 F (36.7 C)  TempSrc: Oral Oral  Oral  SpO2: 97% 96% 93% 95%  Weight:      Height:        Intake/Output Summary (Last 24 hours) at 04/23/2024 0839 Last data filed at 04/22/2024 1700 Gross per 24 hour  Intake 480 ml  Output --  Net 480 ml   Filed Weights   04/19/24 1945 04/21/24 1358 04/21/24 1720  Weight: 101.8 kg 107.3 kg 105 kg    Scheduled Meds:  aspirin  EC  81 mg Oral Daily   atorvastatin   80 mg Oral Daily   Chlorhexidine  Gluconate Cloth  6 each Topical Q0600   clopidogrel   75 mg Oral Daily   fluticasone  furoate-vilanterol  1 puff Inhalation Daily   heparin   5,000 Units Subcutaneous Q8H   metoprolol  succinate  25 mg Oral Daily   pantoprazole   40 mg Oral Daily   sevelamer  carbonate  800 mg Oral TID WC   Continuous Infusions:    Nutritional status     Body mass index is 31.39 kg/m.  Data Reviewed:   CBC: Recent Labs  Lab 04/18/24 1250 04/18/24 1257 04/19/24 0516 04/21/24 1442  WBC 4.4  --  4.5 4.3  HGB 12.2* 12.6* 11.1* 11.2*  HCT 39.4 37.0* 35.5*  35.5*  MCV 94.0  --  92.7 91.3  PLT 97*  --  178 197   Basic Metabolic Panel: Recent Labs  Lab 04/18/24 1250 04/18/24 1257 04/19/24 0516 04/19/24 0750 04/21/24 1442  NA 143 139 140  --  137  K 4.3 4.1 4.6  --  3.6  CL 98 103 98  --  96*  CO2 23  --  27  --  22  GLUCOSE 95 97 71  --  113*  BUN 49* 48* 53*  --  40*  CREATININE 10.60* 11.70* 11.60*  --  8.58*  CALCIUM  10.0  --  9.2  --  9.2  PHOS  --   --   --  8.4* 4.9*   GFR: Estimated Creatinine Clearance: 9.5 mL/min (A) (by C-G formula based on SCr of 8.58 mg/dL (H)). Liver Function Tests: Recent Labs  Lab 04/18/24 1250 04/19/24 0516 04/21/24 1442  AST 22 20  --   ALT 15 11  --   ALKPHOS 95 86  --   BILITOT 0.5  0.5  --   PROT 7.3 6.0*  --   ALBUMIN 4.3 3.7 3.7   No results for input(s): LIPASE, AMYLASE in the last 168 hours. No results for input(s): AMMONIA in the last 168 hours. Coagulation Profile: No results for input(s): INR, PROTIME in the last 168 hours. Cardiac Enzymes: No results for input(s): CKTOTAL, CKMB, CKMBINDEX, TROPONINI in the last 168 hours. BNP (last 3 results) No results for input(s): PROBNP in the last 8760 hours. HbA1C: No results for input(s): HGBA1C in the last 72 hours.  CBG: Recent Labs  Lab 04/18/24 1211 04/21/24 0013  GLUCAP 79 135*   Lipid Profile: No results for input(s): CHOL, HDL, LDLCALC, TRIG, CHOLHDL, LDLDIRECT in the last 72 hours.  Thyroid  Function Tests: No results for input(s): TSH, T4TOTAL, FREET4, T3FREE, THYROIDAB in the last 72 hours. Anemia Panel: No results for input(s): VITAMINB12, FOLATE, FERRITIN, TIBC, IRON, RETICCTPCT in the last 72 hours. Sepsis Labs: No results for input(s): PROCALCITON, LATICACIDVEN in the last 168 hours.  No results found for this or any previous visit (from the past 240 hours).       Radiology Studies: No results found.          LOS: 5 days   Time spent=  35 mins    Deliliah Room, MD [facility]  If 7PM-7AM, please contact night-coverage  04/23/2024, 8:39 AM  "

## 2024-04-24 DIAGNOSIS — I639 Cerebral infarction, unspecified: Secondary | ICD-10-CM | POA: Diagnosis not present

## 2024-04-24 MED ORDER — METOPROLOL SUCCINATE ER 25 MG PO TB24: 25.0000 mg | ORAL_TABLET | Freq: Every day | ORAL | Status: AC

## 2024-04-24 MED ORDER — SEVELAMER CARBONATE 800 MG PO TABS: 800.0000 mg | ORAL_TABLET | Freq: Three times a day (TID) | ORAL | 0 refills | Status: AC

## 2024-04-24 NOTE — Plan of Care (Signed)
" °  Problem: Self-Care: Goal: Ability to participate in self-care as condition permits will improve 04/24/2024 0204 by Mallory Marks, Pascal Stiggers Anne D, RN Outcome: Not Progressing 04/24/2024 0203 by Mallory Marks, Vermon Grays Anne D, RN Outcome: Not Progressing Goal: Verbalization of feelings and concerns over difficulty with self-care will improve 04/24/2024 0204 by Mallory Marks, Gerrald Basu Anne D, RN Outcome: Not Progressing 04/24/2024 0203 by Mallory Marks, Chaylee Ehrsam Anne D, RN Outcome: Not Progressing Goal: Ability to communicate needs accurately will improve 04/24/2024 0204 by Mallory Marks, Shavone Nevers Anne D, RN Outcome: Not Progressing 04/24/2024 0203 by Mallory Marks, Marlita Keil Anne D, RN Outcome: Not Progressing   Problem: Ischemic Stroke/TIA Tissue Perfusion: Goal: Complications of ischemic stroke/TIA will be minimized 04/24/2024 0204 by Mallory Marks, Jakki Doughty Anne D, RN Outcome: Not Progressing 04/24/2024 0203 by Mallory Marks, Lucile Hillmann Anne D, RN Outcome: Not Progressing   "

## 2024-04-24 NOTE — Progress Notes (Addendum)
 Noted that pt will likely d/c today and will be doing out-pt PT/OT at [facility] in [place]. Contacted [employer] and informed of this for clinic info purposes. If pt will d/c today, will fax d/c summ and last neph note to out-pt HD clinic. Will continue to monitor.   Adair Lemar Dialysis Navigator [phone]  Addendum 11am D/c summary and last neph note faxed at this time. Pt did not receive dialysis on day of discharge. No further support needed.

## 2024-04-24 NOTE — Discharge Summary (Addendum)
 " Physician Discharge Summary   Patient: [name] [name] MRN: [id number] DOB: [birth year]  Admit date:     04/18/2024  Discharge date: 04/24/2024  Discharge Physician: Deliliah Room   PCP: Tobie Suzzane POUR, MD   Recommendations at discharge:    F/u with your PCP in one week HD as per schedule F/u with cardiologist, Dr Ladona in one month for PCI Continue taking meds as prescribed  Discharge Diagnoses: Principal Problem:   Stroke (cerebrum) [facility]) Active Problems:   ESRD (end stage renal disease) (HCC)   Type 2 diabetes mellitus in patient with obesity (HCC)   Obesity (BMI 30-39.9)   Heart failure with improved ejection fraction (HFimpEF) (HCC)   HLD (hyperlipidemia)   Hemodialysis-associated hypotension  Hospital Course:  74 y.o. male,  with medical history significant of with history of asthma, CHF, depression, diabetes mellitus type 2, ESRD on hemodialysis Monday Wednesday Friday, CAD status post LAD PCI in 2023, chronic systolic CHF. - Patient followed by renal transplant plant team at [facility], and he was referred for cardiac clearance, he went for cardiac cath at [facility] 04/17/2024, which did show high risk CAD, with ostial/proximal LAD focal calcification 90% stenosis, with large OMI with ostial 90% stenosis, options were for PCI to LAD, and OM medical treatment, versus CABG.   Patient presented to [facility] ED on 04/18/2025 due to complaints of weakness, presents to ED with right-sided weakness   Diagnosed with acute left internal capsule CVA.  Neurology consulted.   Nephrology consulted for ESRD management.  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 Was not a candidate for tPA as he recently had cardiac catheterization procedure done on 04/17/2024. - Continue with aspirin , Plavix  and statin. -Follow-up transthoracic  echocardiogram-EF 60 to 65% with grade 1 diastolic dysfunction, no significant valvular normalities.  No interatrial shunt. - PT/OT/speech therapy consultations done. OPPT arranged. - Discussed with Dr Shelton and recommendation is to continue with DAPT and statin and outpt neuro follow up. PT/OT recommended OPPT. -No indication for CIR placement as his right sided strength has improved and he is able to walk independently now.   Coronary artery disease status post stenting, POA: Prior history of stenting.  Cardiac catheterization procedure done via right radial artery on 04/17/2024 showed high risk lesions.   Multidisciplinary cardiology team has decided to go with PCI. He will f/u with Dr Ladona in 4 weeks for PCI. I have informed Dr Ladona of patient's discharge from the hospital. Continue with aspirin , statin, metoprolol  and Plavix    Class I obesity: Dietary modifications and physical activity to facilitate weight loss. He may be a candidate for semaglutide (additional benefit in patients with CAD)   ESRD,POA: On HD  MWF -Nephrology consulted. Informed Dr Macel of his discharge home. -HD done in the hospital on 04/19/24 as well as 04/21/2024.  Hemodialysis done on 04/23/2024 but no ultrafiltration was done because machine was malfunctioning. -Continue with sevelamer .   Right thyroid  nodule,POA: -Outpatient thyroid  ultrasound   Type 2 diabetes mellitus,POA: -Well controlled   Essential Hypertension, POA:  Resumed home dose of metoprolol  succinate 25 mg p.o. daily.  Check BP at home daily.   Disposition: Home with OPPT        Consultants: Nephro. Neurology Procedures performed: None  Disposition: Home with OPPT Diet recommendation:  Cardiac diet DISCHARGE MEDICATION: Allergies as of 04/24/2024   No Known Allergies  Medication List     TAKE these medications    albuterol  108 (90 Base) MCG/ACT inhaler Commonly known as: VENTOLIN  HFA Inhale 1-2 puffs into the lungs every 6  (six) hours as needed for wheezing or shortness of breath.   aspirin  EC 81 MG tablet Take 81 mg by mouth daily. Swallow whole.   atorvastatin  80 MG tablet Commonly known as: LIPITOR  Take 1 tablet (80 mg total) by mouth daily.   Breyna  160-4.5 MCG/ACT inhaler Generic drug: budesonide -formoterol  Inhale 2 puffs into the lungs 2 (two) times daily.   clopidogrel  75 MG tablet Commonly known as: PLAVIX  Take 1 tablet (75 mg total) by mouth daily.   metoprolol  succinate 25 MG 24 hr tablet Commonly known as: TOPROL -XL Take 1 tablet (25 mg total) by mouth daily. What changed: See the new instructions.   midodrine 5 MG tablet Commonly known as: PROAMATINE Take 5 mg by mouth every other day. Before HD   nitroGLYCERIN  0.4 MG SL tablet Commonly known as: NITROSTAT  Place 1 tablet (0.4 mg total) under the tongue every 5 (five) minutes as needed for chest pain.   pantoprazole  40 MG tablet Commonly known as: PROTONIX  Take 1 tablet (40 mg total) by mouth daily.   sevelamer  carbonate 800 MG tablet Commonly known as: RENVELA  Take 1 tablet (800 mg total) by mouth 3 (three) times daily with meals.   Xphozah 30 MG Tabs Generic drug: Tenapanor HCl (CKD) Take 30 mg by mouth 2 (two) times daily.        Contact information for follow-up providers     Tobie Suzzane POUR, MD. Schedule an appointment as soon as possible for a visit in 1 week(s).   Specialty: Internal Medicine Contact information: [address] [place] [address] [phone]         Ladona Heinz, MD. Schedule an appointment as soon as possible for a visit in 1 month(s).   Specialty: Cardiology Contact information: [address] [place] [address] [phone]              Contact information for after-discharge care     Destination     [facility] [place] .   Service: Inpatient Rehabilitation Contact information: [address] [place]  [place] [address] [phone]                    Discharge Exam: Fredricka Weights   04/21/24 1720 04/23/24 1123 04/23/24 1637  Weight: 105 kg 102 kg 103 kg   General exam: Appears calm and comfortable  Respiratory system: Clear to auscultation. Respiratory effort normal. Cardiovascular system: S1 & S2 heard, RRR. No JVD, murmurs, rubs, gallops or clicks.  2+ bilateral pedal edema. Gastrointestinal system: Abdomen is nondistended, soft and nontender. No organomegaly or masses felt. Normal bowel sounds heard. Central nervous system: Alert and oriented. No focal neurological deficits. Extremities: Power 4.5/5 on the right arm and right leg, 5/5 on the left arm and left leg Skin: No rashes, lesions or ulcers Psychiatry: Judgement and insight appear normal. Mood & affect appropriate  Condition at discharge: good  The results of significant diagnostics from this hospitalization (including imaging, microbiology, ancillary and laboratory) are listed below for reference.   Imaging Studies: ECHOCARDIOGRAM COMPLETE Result Date: 04/20/2024    ECHOCARDIOGRAM REPORT   Patient Name:   Randon Klunder Date of Exam: 04/19/2024 Medical Rec #:  [id number]      Height:       72.0 in Accession #:  [id number]     Weight:       221.8 lb Date of Birth:  [birth year]     BSA:          2.226 m Patient Age:    73 years       BP:           178/82 mmHg Patient Gender: M              HR:           92 bpm. Exam Location:  [facility] Procedure: 2D Echo, Cardiac Doppler, Color Doppler and Saline Contrast Bubble            Study (Both Spectral and Color Flow Doppler were utilized during            procedure). Indications:    Stroke l63.9  History:        Patient has prior history of Echocardiogram examinations, most                 recent 01/06/2023. CHF, CAD and Previous Myocardial Infarction;                 Risk Factors:Non-Smoker, Dyslipidemia, Diabetes and                 Hypertension. ESRD on dialysis.  Sonographer:     Aida Pizza RCS Referring Phys: [id number] DAWOOD S ELGERGAWY IMPRESSIONS  1. A small linear mobile echodensity attached to the papillary muscle is seen flickering in and out of the LV cavity. Unclear if it is artifact due to suboptimal image quality. Best seen in images 78 and 79. Consider further evaluation with TEE.SABRA Left ventricular ejection fraction, by estimation, is 60 to 65%. The left ventricle has normal function. The left ventricle has no regional wall motion abnormalities. Left ventricular diastolic parameters are consistent with Grade I diastolic dysfunction (impaired relaxation).  2. Right ventricular systolic function is normal. The right ventricular size is normal. Tricuspid regurgitation signal is inadequate for assessing PA pressure.  3. Left atrial size was moderately dilated.  4. Right atrial size was mildly dilated.  5. The mitral valve is normal in structure. No evidence of mitral valve regurgitation. No evidence of mitral stenosis.  6. The aortic valve was not well visualized. Aortic valve regurgitation is not visualized. No aortic stenosis is present.  7. The inferior vena cava is dilated in size with >50% respiratory variability, suggesting right atrial pressure of 8 mmHg.  8. Agitated saline contrast bubble study was negative, with no evidence of any interatrial shunt. FINDINGS  Left Ventricle: A small linear mobile echodensity attached to the papillary muscle is seen flickering in and out of the LV cavity. Unclear if it is artifact due to suboptimal image quality. Best seen in images 78 and 79. Consider further evaluation with  TEE. Left ventricular ejection fraction, by estimation, is 60 to 65%. The left ventricle has normal function. The left ventricle has no regional wall motion abnormalities. Strain was performed and the global longitudinal strain is indeterminate. The left ventricular internal cavity size was normal in size. There is no left ventricular hypertrophy. Left ventricular  diastolic parameters are consistent with Grade I diastolic dysfunction (impaired relaxation). Normal left ventricular filling pressure. Right Ventricle: The right ventricular size is normal. No increase in right ventricular wall thickness. Right ventricular systolic function is normal. Tricuspid regurgitation signal is inadequate for assessing PA pressure. Left Atrium: Left atrial size was moderately dilated. Right Atrium: Right atrial  size was mildly dilated. Pericardium: There is no evidence of pericardial effusion. Mitral Valve: The mitral valve is normal in structure. No evidence of mitral valve regurgitation. No evidence of mitral valve stenosis. Tricuspid Valve: The tricuspid valve is not well visualized. Tricuspid valve regurgitation is not demonstrated. No evidence of tricuspid stenosis. Aortic Valve: The aortic valve was not well visualized. Aortic valve regurgitation is not visualized. No aortic stenosis is present. Aortic valve mean gradient measures 8.0 mmHg. Aortic valve peak gradient measures 14.4 mmHg. Aortic valve area, by VTI measures 2.77 cm. Pulmonic Valve: The pulmonic valve was not well visualized. Pulmonic valve regurgitation is not visualized. No evidence of pulmonic stenosis. Aorta: The aortic root is normal in size and structure. Venous: The inferior vena cava is dilated in size with greater than 50% respiratory variability, suggesting right atrial pressure of 8 mmHg. IAS/Shunts: No atrial level shunt detected by color flow Doppler. Agitated saline contrast was given intravenously to evaluate for intracardiac shunting. Agitated saline contrast bubble study was negative, with no evidence of any interatrial shunt. Additional Comments: 3D was performed not requiring image post processing on an independent workstation and was indeterminate.  LEFT VENTRICLE PLAX 2D LVIDd:         5.00 cm      Diastology LVIDs:         3.50 cm      LV e' medial:    7.96 cm/s LV PW:         1.10 cm      LV E/e'  medial:  12.5 LV IVS:        1.10 cm      LV e' lateral:   10.60 cm/s LVOT diam:     2.20 cm      LV E/e' lateral: 9.4 LV SV:         116 LV SV Index:   52 LVOT Area:     3.80 cm  LV Volumes (MOD) LV vol d, MOD A2C: 176.0 ml LV vol d, MOD A4C: 156.0 ml LV vol s, MOD A2C: 61.9 ml LV vol s, MOD A4C: 51.2 ml LV SV MOD A2C:     114.1 ml LV SV MOD A4C:     156.0 ml LV SV MOD BP:      109.5 ml RIGHT VENTRICLE RV S prime:     21.60 cm/s TAPSE (M-mode): 3.2 cm LEFT ATRIUM              Index        RIGHT ATRIUM           Index LA diam:        3.80 cm  1.71 cm/m   RA Area:     23.50 cm LA Vol (A2C):   118.0 ml 53.00 ml/m  RA Volume:   70.20 ml  31.53 ml/m LA Vol (A4C):   82.6 ml  37.10 ml/m LA Biplane Vol: 101.0 ml 45.37 ml/m  AORTIC VALVE AV Area (Vmax):    3.02 cm AV Area (Vmean):   2.94 cm AV Area (VTI):     2.77 cm AV Vmax:           190.00 cm/s AV Vmean:          133.000 cm/s AV VTI:            0.418 m AV Peak Grad:      14.4 mmHg AV Mean Grad:      8.0 mmHg LVOT Vmax:  151.00 cm/s LVOT Vmean:        103.000 cm/s LVOT VTI:          0.305 m LVOT/AV VTI ratio: 0.73  AORTA Ao Root diam: 3.50 cm MITRAL VALVE MV Area (PHT): 2.32 cm     SHUNTS MV Decel Time: 327 msec     Systemic VTI:  0.30 m MV E velocity: 99.60 cm/s   Systemic Diam: 2.20 cm MV A velocity: 153.00 cm/s MV E/A ratio:  0.65 Vishnu Priya Mallipeddi Electronically signed by Diannah Late Mallipeddi Signature Date/Time: 04/20/2024/3:38:57 PM    Final    CT ANGIO HEAD NECK W WO CM Result Date: 04/18/2024 EXAM: CT HEAD WITHOUT CTA HEAD AND NECK WITH AND WITHOUT 04/18/2024 02:10:27 PM TECHNIQUE: CTA of the head and neck was performed with and without the administration of 75 mL of intravenous contrast (iohexol  (OMNIPAQUE ) 350 MG/ML injection 75 mL IOHEXOL  350 MG/ML SOLN). Noncontrast CT of the head with reconstructed 2-D images are also provided for review. Multiplanar 2D and/or 3D reformatted images are provided for review. Automated exposure  control, iterative reconstruction, and/or weight based adjustment of the mA/kV was utilized to reduce the radiation dose to as low as reasonably achievable. COMPARISON: MRI head 04/18/2024 CLINICAL HISTORY: Neuro deficit, acute, stroke suspected; Right sided weakness. FINDINGS: CT HEAD: BRAIN AND VENTRICLES: Subtle hypodensity in the posterior limb of the left internal capsule corresponds to an acute infarct on MRI. Hypodensities elsewhere in the cerebral white matter bilaterally are nonspecific but compatible with mild chronic small vessel ischemic disease. Small chronic infarcts are again noted in the left frontal and right occipital lobes as well as right thalamus and left caudate. No acute intracranial hemorrhage. No mass effect. No midline shift. No extra-axial fluid collection. No hydrocephalus. ORBITS: No acute abnormality. SINUSES AND MASTOIDS: Small osteoma in a posterior left ethmoid air cell. Mild mucosal thickening in the paranasal sinuses. Trace bilateral mastoid fluid. CTA NECK: AORTIC ARCH AND ARCH VESSELS: Mild atherosclerotic calcification in the aortic arch and moderate calcification in the subclavian arteries. No dissection or arterial injury. No significant stenosis of the brachiocephalic or subclavian arteries. CERVICAL CAROTID ARTERIES: Moderately extensive, predominantly calcified plaque throughout the mid and distal common carotid arteries and proximal internal carotid arteries bilaterally. 50% stenosis of the distal right common carotid artery. No significant stenosis of the cervical ICAs. No dissection or arterial injury. CERVICAL VERTEBRAL ARTERIES: Patent and strongly dominant left vertebral artery with calcified plaque at its origin resulting in moderate to severe stenosis. Diminutive right vertebral artery with poor visualization of the proximal right V1 segment which may be severely narrowed or occluded. The right vertebral artery is patent from the distal V1 segment throughout the  remainder of the neck but is diffusely small in caliber and mildly irregular. No dissection or arterial injury. LUNGS AND MEDIASTINUM: Unremarkable. SOFT TISSUES: Diffusely enlarged, heterogeneous, multinodular thyroid  gland including an approximately 4.3 cm nodule on the right. Mild substernal extension of the thyroid . BONES: Moderate cervical spondylosis. CTA HEAD: ANTERIOR CIRCULATION: The intracranial internal carotid arteries are patent with relatively diffuse atherosclerotic calcification resulting in moderate right and mild left lacerum segment stenoses and up to moderate right cavernous segment stenosis. ACAs and MCAs are patent without evidence of a proximal branch occlusion or significant proximal stenosis. The right A1 segment is absent. No aneurysm. POSTERIOR CIRCULATION: The intracranial left vertebral artery is patent and dominant, supplying the basilar artery and with up to mild V4 stenosis due to calcified plaque. The intracranial  right vertebral artery is patent and small with mild stenosis proximally and with the vessel terminating at PICA. The basilar artery is widely patent. There are large posterior communicating arteries bilaterally with hypoplasia of the P1 segments. Both PCAs are patent without evidence of a significant proximal stenosis. No aneurysm. OTHER: No dural venous sinus thrombosis on this non-dedicated study. IMPRESSION: 1. Known acute lacunar infarct in the left internal capsule. 2. Age advanced atherosclerosis in the head and neck without a large vessel occlusion in the anterior circulation. 3. 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 4. Dominant left vertebral artery with moderate to severe stenosis of its origin. 5. 50% stenosis of the distal right common carotid artery. 6. Moderate right and mild left intracranial ICA stenoses. 7. Approximately 4.3 cm right thyroid  nodule in a diffusely enlarged, heterogeneous  multinodular thyroid  gland. Recommend non-emergent thyroid  ultrasound. Electronically signed by: Dasie Hamburg MD 04/18/2024 02:41 PM EST RP Workstation: [id number]   MR BRAIN WO CONTRAST Result Date: 04/18/2024 EXAM: MRI BRAIN WITHOUT CONTRAST 04/18/2024 01:58:23 PM TECHNIQUE: Multiplanar multisequence MRI of the head/brain was performed without the administration of intravenous contrast. COMPARISON: None available. CLINICAL HISTORY: R sided ataxia. Right arm and leg weakness. FINDINGS: BRAIN AND VENTRICLES: There is a 1 cm acute infarct in the posterior limb of the left internal capsule. No intracranial hemorrhage, mass, midline shift, hydrocephalus, or extra-axial fluid collection is identified. Small chronic cortical infarcts are noted in the right occipital and high left frontal lobes. Scattered T2 hyperintensities in the cerebral white matter bilaterally are nonspecific but compatible with mild chronic small vessel ischemic disease. There are chronic lacunar infarcts in the pons, right thalamus, and left caudate nucleus. There is mild cerebral atrophy. Major intracranial vascular flow voids are preserved. ORBITS: No acute abnormality. SINUSES AND MASTOIDS: Mild mucosal thickening in the paranasal sinuses. Trace bilateral mastoid fluid. BONES AND SOFT TISSUES: Normal marrow signal. Subcutaneous lipoma in the right suboccipital region. IMPRESSION: 1. Acute infarct in the posterior limb of the left internal capsule. 2. Chronic small vessel ischemic disease with small chronic infarcts as above. Electronically signed by: Dasie Hamburg MD 04/18/2024 02:22 PM EST RP Workstation: [id number]   CARDIAC CATHETERIZATION Result Date: 04/17/2024 Images from the original result were not included. Cardiac Catheterization 04/17/2024: Hemodynamic data: LV 130/36, EDP 14 mmHg.  Ao 138/73, mean 102 mmHg.  There is no pressure gradient across the aortic valve. Angiographic data: LM: Mildly calcified, widely patent. LAD:  Severely calcified in the proximal segment.  There is a ostial proximal 90% stenosis followed by diffuse long segment 50% stenosis and a previously placed proximal to mid LAD stent (3.0 x 18 mm Onyx on 05/26/2021) has 30 to 40% ISR.  Mid to distal LAD has mild disease. LCx: Large vessel, gives origin to large OM1 and large OM 3.  OM1 has ostial 90% stenosis. RCA: Large-caliber vessel and a dominant vessel.  Has anterior origin.  Has calcific diffuse proximal and ostial 30% stenosis. Impression and recommendations: Patient with high risk  CAD with ostial/proximal LAD focal calcific 90% stenosis and large OM1 with ostial 90% stenosis.  Options would be PCI to LAD and treat OM medically versus consider CABG, will discuss during Heart Team Meeting.    Microbiology: Results for orders placed or performed during the hospital encounter of 05/25/21  Resp Panel by RT-PCR (Flu A&B, Covid) Nasopharyngeal Swab     Status: None   Collection Time: 05/25/21  9:10 PM   Specimen:  Nasopharyngeal Swab; Nasopharyngeal(NP) swabs in vial transport medium  Result Value Ref Range Status   SARS Coronavirus 2 by RT PCR NEGATIVE NEGATIVE Final    Comment: (NOTE) SARS-CoV-2 target nucleic acids are NOT DETECTED.  The SARS-CoV-2 RNA is generally detectable in upper respiratory specimens during the acute phase of infection. The lowest concentration of SARS-CoV-2 viral copies this assay can detect is 138 copies/mL. A negative result does not preclude SARS-Cov-2 infection and should not be used as the sole basis for treatment or other patient management decisions. A negative result may occur with  improper specimen collection/handling, submission of specimen other than nasopharyngeal swab, presence of viral mutation(s) within the areas targeted by this assay, and inadequate number of viral copies(<138 copies/mL). A negative result must be combined with clinical observations, patient history, and epidemiological information.  The expected result is Negative.  Fact Sheet for Patients:  bloggercourse.com  Fact Sheet for Healthcare Providers:  seriousbroker.it  This test is no t yet approved or cleared by the [place]  FDA and  has been authorized for detection and/or diagnosis of SARS-CoV-2 by FDA under an Emergency Use Authorization (EUA). This EUA will remain  in effect (meaning this test can be used) for the duration of the COVID-19 declaration under Section 564(b)(1) of the Act, 21 U.S.C.section 360bbb-3(b)(1), unless the authorization is terminated  or revoked sooner.       Influenza A by PCR NEGATIVE NEGATIVE Final   Influenza B by PCR NEGATIVE NEGATIVE Final    Comment: (NOTE) The Xpert Xpress SARS-CoV-2/FLU/RSV plus assay is intended as an aid in the diagnosis of influenza from Nasopharyngeal swab specimens and should not be used as a sole basis for treatment. Nasal washings and aspirates are unacceptable for Xpert Xpress SARS-CoV-2/FLU/RSV testing.  Fact Sheet for Patients: bloggercourse.com  Fact Sheet for Healthcare Providers: seriousbroker.it  This test is not yet approved or cleared by the [place]  FDA and has been authorized for detection and/or diagnosis of SARS-CoV-2 by FDA under an Emergency Use Authorization (EUA). This EUA will remain in effect (meaning this test can be used) for the duration of the COVID-19 declaration under Section 564(b)(1) of the Act, 21 U.S.C. section 360bbb-3(b)(1), unless the authorization is terminated or revoked.  Performed at [facility], [address], [place], [place] [address]     Labs: CBC: Recent Labs  Lab 04/18/24 1250 04/18/24 1257 04/19/24 0516 04/21/24 1442 04/23/24 1329  WBC 4.4  --  4.5 4.3 4.6  NEUTROABS  --   --   --   --  3.5  HGB 12.2* 12.6* 11.1* 11.2* 10.7*  HCT 39.4 37.0* 35.5* 35.5* 34.0*  MCV 94.0  --  92.7 91.3  92.6  PLT 97*  --  178 197 163   Basic Metabolic Panel: Recent Labs  Lab 04/18/24 1250 04/18/24 1257 04/19/24 0516 04/19/24 0750 04/21/24 1442 04/23/24 1329  NA 143 139 140  --  137 135  K 4.3 4.1 4.6  --  3.6 4.2  CL 98 103 98  --  96* 93*  CO2 23  --  27  --  22 22  GLUCOSE 95 97 71  --  113* 145*  BUN 49* 48* 53*  --  40* 54*  CREATININE 10.60* 11.70* 11.60*  --  8.58* 10.40*  CALCIUM  10.0  --  9.2  --  9.2 9.6  PHOS  --   --   --  8.4* 4.9* 5.2*   Liver Function Tests: Recent Labs  Lab 04/18/24 1250 04/19/24 0516 04/21/24 1442 04/23/24 1329  AST 22 20  --   --   ALT 15 11  --   --   ALKPHOS 95 86  --   --   BILITOT 0.5 0.5  --   --   PROT 7.3 6.0*  --   --   ALBUMIN 4.3 3.7 3.7 3.6   CBG: Recent Labs  Lab 04/18/24 1211 04/21/24 0013  GLUCAP 79 135*    Discharge time spent: 40 minutes.  Signed: Deliliah Room, MD [facility] 04/24/2024 "

## 2024-04-24 NOTE — Discharge Instructions (Signed)
 SABRA

## 2024-04-24 NOTE — Plan of Care (Signed)
  Problem: Education: Goal: Knowledge of General Education information will improve Description: Including pain rating scale, medication(s)/side effects and non-pharmacologic comfort measures Outcome: Progressing   Problem: Health Behavior/Discharge Planning: Goal: Ability to manage health-related needs will improve Outcome: Progressing   Problem: Clinical Measurements: Goal: Ability to maintain clinical measurements within normal limits will improve Outcome: Progressing Goal: Will remain free from infection Outcome: Progressing   Problem: Activity: Goal: Risk for activity intolerance will decrease Outcome: Progressing   Problem: Elimination: Goal: Will not experience complications related to bowel motility Outcome: Progressing   Problem: Safety: Goal: Ability to remain free from injury will improve Outcome: Progressing   Problem: Skin Integrity: Goal: Risk for impaired skin integrity will decrease Outcome: Progressing

## 2024-04-24 NOTE — TOC Transition Note (Signed)
 Transition of Care [facility]) - Discharge Note   Patient Details  Name: [name] [name] MRN: [id number] Date of Birth: [birth year]  Transition of Care [facility]) CM/SW Contact:  Noreen KATHEE Cleotilde ISRAEL Phone Number: 04/24/2024, 9:43 AM   Clinical Narrative:     CSW spoke with patient at bedside regarding OPPT/OT recommendation . Patient agreeable to referral being sent to the [facility] in [place]. Order placed and referral sent to OP office. Patient expressed that his 3rd cousin will pick him up today. CSW signing off.   Final next level of care: OP Rehab Barriers to Discharge: Barriers Resolved   Patient Goals and CMS Choice Patient states their goals for this hospitalization and ongoing recovery are:: Get well CMS Medicare.gov Compare Post Acute Care list provided to:: Patient Choice offered to / list presented to : Patient     Discharge Placement   Patient to be transferred to facility by: Family Name of family member notified: [name] [name] Patient and family notified of of transfer: 04/24/24  Discharge Plan and Services Additional resources added to the After Visit Summary for      Social Drivers of Health (SDOH) Interventions SDOH Screenings   Food Insecurity: No Food Insecurity (04/18/2024)  Housing: Low Risk (04/18/2024)  Transportation Needs: No Transportation Needs (04/18/2024)  Utilities: Not At Risk (04/18/2024)  Alcohol Screen: Low Risk (07/26/2023)  Depression (PHQ2-9): Low Risk (03/13/2024)  Financial Resource Strain: Low Risk (07/26/2023)  Physical Activity: Patient Declined (07/26/2023)  Social Connections: Socially Integrated (04/18/2024)  Stress: No Stress Concern Present (07/26/2023)  Tobacco Use: Low Risk (04/18/2024)  Health Literacy: Adequate Health Literacy (07/26/2023)     Readmission Risk Interventions    04/24/2024    9:42 AM  Readmission Risk Prevention Plan  Transportation Screening Complete  Home Care Screening Complete  Medication  Review (RN CM) Complete

## 2024-04-24 NOTE — Progress Notes (Signed)
 Inpatient Rehab Admissions Coordinator:   Notified of updated PT/OT recommendations for outpatient follow up.  We will sign off for CIR at this time.   Reche Lowers, PT, DPT Admissions Coordinator [phone] 04/24/2024 11:02 AM

## 2024-04-24 NOTE — Progress Notes (Signed)
 Admit: 04/18/2024 LOS: 6  39M ESRD MWF via LUE AVF [facility] with acute CVA  Subjective:  Feeling well today. Some issues with machine yesterday with minimal UF but no SOB.  01/05 0701 - 01/06 0700 In: 240 [P.O.:240] Out: 0   Filed Weights   04/21/24 1720 04/23/24 1123 04/23/24 1637  Weight: 105 kg 102 kg 103 kg    Scheduled Meds:  aspirin  EC  81 mg Oral Daily   atorvastatin   80 mg Oral Daily   clopidogrel   75 mg Oral Daily   fluticasone  furoate-vilanterol  1 puff Inhalation Daily   heparin   5,000 Units Subcutaneous Q8H   metoprolol  succinate  25 mg Oral Daily   pantoprazole   40 mg Oral Daily   sevelamer  carbonate  800 mg Oral TID WC   Continuous Infusions: PRN Meds:.acetaminophen  **OR** acetaminophen  (TYLENOL ) oral liquid 160 mg/5 mL **OR** acetaminophen , guaiFENesin -dextromethorphan , hydrALAZINE , hydrocortisone  cream, nitroGLYCERIN , senna-docusate  Current Labs: reviewed   Physical Exam:  Blood pressure 131/63, pulse 64, temperature 98.2 F (36.8 C), temperature source Oral, resp. rate 20, height 6' (1.829 m), weight 103 kg, SpO2 96%. GEN: wdwn, sitting in bed, nad ENT: no nasal discharge, mmm EYES: no scleral icterus, eomi CV: normal rate PULM: no iwob, bilateral chest rise ABD: NABS, non-distended SKIN: no rashes or jaundice   A Acute CVA: Per primary neurology, waiting on insurance clearance for CIR ESRD on HD MWF [facility] using AV fistula: Continue dialysis per MWF schedule Hypertension: Medications adjusted around CVA.  BP overall acceptable Anemia: Hemoglobin stable without ESA need CKD-BMD: Hyperphosphatemia noted, sevelamer  was started here and phosphorus now improved History of CAD: h/o PCI, LHC 12/30 showed lesions in need of intervention - PCI vs CABG being discussed by cardiology.   P May DC today but if still here tomorrow can provide HD. Medication Issues; Preferred narcotic agents for pain control are hydromorphone, fentanyl , and methadone.  Morphine should not be used.  Baclofen should be avoided Avoid oral sodium phosphate and magnesium citrate based laxatives / bowel preps     04/24/2024, 10:34 AM  Recent Labs  Lab 04/19/24 0516 04/19/24 0750 04/21/24 1442 04/23/24 1329  NA 140  --  137 135  K 4.6  --  3.6 4.2  CL 98  --  96* 93*  CO2 27  --  22 22  GLUCOSE 71  --  113* 145*  BUN 53*  --  40* 54*  CREATININE 11.60*  --  8.58* 10.40*  CALCIUM  9.2  --  9.2 9.6  PHOS  --  8.4* 4.9* 5.2*   Recent Labs  Lab 04/19/24 0516 04/21/24 1442 04/23/24 1329  WBC 4.5 4.3 4.6  NEUTROABS  --   --  3.5  HGB 11.1* 11.2* 10.7*  HCT 35.5* 35.5* 34.0*  MCV 92.7 91.3 92.6  PLT 178 197 163

## 2024-04-24 NOTE — Progress Notes (Signed)
 " PROGRESS NOTE    [name] [name]  FMW:[id number] DOB: 04/04/1951 DOA: 04/18/2024 PCP: Tobie Suzzane POUR, MD   Brief Narrative:   74 y.o. male,  with medical history significant of with history of asthma, CHF, depression, diabetes mellitus type 2, ESRD on hemodialysis Monday Wednesday Friday, CAD status post LAD PCI in 2023, chronic systolic CHF. - Patient followed by renal transplant plant team at [facility], and he was referred for cardiac clearance, he went for cardiac cath at [facility] 04/17/2024, which did show high risk CAD, with ostial/proximal LAD focal calcification 90% stenosis, with large OMI with ostial 90% stenosis, options were for PCI to LAD, and OM medical treatment, versus CABG.  Patient presented to [facility] ED on 04/18/2025 due to complaints of weakness, presents to ED with right-sided weakness  Diagnosed with acute left internal capsule CVA.  Neurology consulted.  Nephrology consulted for ESRD management.  Pending placement to CIR.  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 Was not a candidate for tPA as he recently had cardiac catheterization procedure done on 04/17/2024. - Continue with aspirin , Plavix  and statin. -Follow-up transthoracic echocardiogram-EF 60 to 65% with grade 1 diastolic dysfunction, no significant valvular normalities.  No interatrial shunt. - Continue to monitor on telemetry - PT/OT/speech therapy consultations placed - Discussed with Dr Shelton and  recommendation is to continue with DAPT and statin and outpt neuro follow up.  -Needs CIR on discharge.  Coronary artery disease status post stenting, POA: Prior history of stenting.  Cardiac catheterization procedure done via right radial artery on 04/17/2024 showed high risk lesions.   Multidisciplinary cardiology team has decided to go with PCI. Timing will be decided later. Continue with aspirin , statin and Plavix   Class I obesity: Dietary modifications and physical activity to facilitate weight loss. He may be a candidate for semaglutide (additional benefit in patients with CAD)  ESRD,POA: On HD  MWF -Nephrology consulted. -HD done in the hospital on 04/19/24 as well as 04/21/2024.  Hemodialysis done on 04/23/2024 but no ultrafiltration was done because machine was malfunctioning. -Rest of the management as per nephrology  Right thyroid  nodule,POA: -Outpatient thyroid  ultrasound  Type 2 diabetes mellitus,POA: -Well controlled -On no medications  Essential Hypertension, POA: Ordered as needed intravenous hydralazine  every 4 hours as needed for BP greater than 160.  Resumed home dose of metoprolol  succinate 25 mg p.o. daily.  Continue to monitor blood pressure closely.  Disposition: Pending placement to CIR.  Medically ready.  Awaiting updated therapy notes.    DVT prophylaxis: heparin  injection 5,000 Units Start: 04/19/24 0600     Code Status: Full Code Family Communication: None at the bedside Status is: Inpatient Remains inpatient appropriate because: Acute CVA    Subjective:  No acute events overnight.  He told me that he is able to walk by himself.  His right sided weakness is getting better.  Patient had hemodialysis yesterday but no ultrafiltration was done because the machine was malfunctioning.  Examination:  General exam: Appears calm and comfortable  Respiratory  system: Clear to auscultation. Respiratory effort normal. Cardiovascular system: S1 & S2 heard, RRR. No  JVD, murmurs, rubs, gallops or clicks.  2+ bilateral pedal edema. Gastrointestinal system: Abdomen is nondistended, soft and nontender. No organomegaly or masses felt. Normal bowel sounds heard. Central nervous system: Alert and oriented. No focal neurological deficits. Extremities: Power 4.5/5 on the right arm and right leg, 5/5 on the left arm and left leg Skin: No rashes, lesions or ulcers Psychiatry: Judgement and insight appear normal. Mood & affect appropriate.      Diet Orders (From admission, onward)     Start     Ordered   04/18/24 1827  Diet renal with fluid restriction Fluid restriction: 1200 mL Fluid; Room service appropriate? Yes; Fluid consistency: Thin  Diet effective now       Question Answer Comment  Fluid restriction: 1200 mL Fluid   Room service appropriate? Yes   Fluid consistency: Thin      04/18/24 1826            Objective: Vitals:   04/23/24 1637 04/23/24 2121 04/24/24 0453 04/24/24 0750  BP:  (!) 153/76 131/63   Pulse:  62 64   Resp:  20 20   Temp:  98.4 F (36.9 C) 98.2 F (36.8 C)   TempSrc:  Oral Oral   SpO2:  96% 96% 96%  Weight: 103 kg     Height:        Intake/Output Summary (Last 24 hours) at 04/24/2024 0844 Last data filed at 04/23/2024 1751 Gross per 24 hour  Intake 240 ml  Output 0 ml  Net 240 ml   Filed Weights   04/21/24 1720 04/23/24 1123 04/23/24 1637  Weight: 105 kg 102 kg 103 kg    Scheduled Meds:  aspirin  EC  81 mg Oral Daily   atorvastatin   80 mg Oral Daily   clopidogrel   75 mg Oral Daily   fluticasone  furoate-vilanterol  1 puff Inhalation Daily   heparin   5,000 Units Subcutaneous Q8H   metoprolol  succinate  25 mg Oral Daily   pantoprazole   40 mg Oral Daily   sevelamer  carbonate  800 mg Oral TID WC   Continuous Infusions:    Nutritional status     Body mass index is 30.8 kg/m.  Data Reviewed:   CBC: Recent Labs  Lab 04/18/24 1250 04/18/24 1257 04/19/24 0516 04/21/24 1442 04/23/24 1329  WBC 4.4   --  4.5 4.3 4.6  NEUTROABS  --   --   --   --  3.5  HGB 12.2* 12.6* 11.1* 11.2* 10.7*  HCT 39.4 37.0* 35.5* 35.5* 34.0*  MCV 94.0  --  92.7 91.3 92.6  PLT 97*  --  178 197 163   Basic Metabolic Panel: Recent Labs  Lab 04/18/24 1250 04/18/24 1257 04/19/24 0516 04/19/24 0750 04/21/24 1442 04/23/24 1329  NA 143 139 140  --  137 135  K 4.3 4.1 4.6  --  3.6 4.2  CL 98 103 98  --  96* 93*  CO2 23  --  27  --  22 22  GLUCOSE 95 97 71  --  113* 145*  BUN 49* 48* 53*  --  40* 54*  CREATININE 10.60* 11.70* 11.60*  --  8.58* 10.40*  CALCIUM  10.0  --  9.2  --  9.2 9.6  PHOS  --   --   --  8.4* 4.9* 5.2*   GFR: Estimated Creatinine Clearance: 7.7 mL/min (A) (by C-G formula based  on SCr of 10.4 mg/dL (H)). Liver Function Tests: Recent Labs  Lab 04/18/24 1250 04/19/24 0516 04/21/24 1442 04/23/24 1329  AST 22 20  --   --   ALT 15 11  --   --   ALKPHOS 95 86  --   --   BILITOT 0.5 0.5  --   --   PROT 7.3 6.0*  --   --   ALBUMIN 4.3 3.7 3.7 3.6   No results for input(s): LIPASE, AMYLASE in the last 168 hours. No results for input(s): AMMONIA in the last 168 hours. Coagulation Profile: No results for input(s): INR, PROTIME in the last 168 hours. Cardiac Enzymes: No results for input(s): CKTOTAL, CKMB, CKMBINDEX, TROPONINI in the last 168 hours. BNP (last 3 results) No results for input(s): PROBNP in the last 8760 hours. HbA1C: No results for input(s): HGBA1C in the last 72 hours.  CBG: Recent Labs  Lab 04/18/24 1211 04/21/24 0013  GLUCAP 79 135*   Lipid Profile: No results for input(s): CHOL, HDL, LDLCALC, TRIG, CHOLHDL, LDLDIRECT in the last 72 hours.  Thyroid  Function Tests: No results for input(s): TSH, T4TOTAL, FREET4, T3FREE, THYROIDAB in the last 72 hours. Anemia Panel: No results for input(s): VITAMINB12, FOLATE, FERRITIN, TIBC, IRON, RETICCTPCT in the last 72 hours. Sepsis Labs: No results for input(s):  PROCALCITON, LATICACIDVEN in the last 168 hours.  No results found for this or any previous visit (from the past 240 hours).       Radiology Studies: No results found.          LOS: 6 days   Time spent= 35 mins    Deliliah Room, MD [facility]  If 7PM-7AM, please contact night-coverage  04/24/2024, 8:44 AM  "

## 2024-04-24 NOTE — Telephone Encounter (Signed)
 No keep it that way and plan PCI in 4 weeks if stable

## 2024-04-24 NOTE — Progress Notes (Signed)
 Physical Therapy Treatment Patient Details Name: [name] [name] MRN: [id number] DOB: [birth year] Today's Date: 04/24/2024   History of Present Illness [name] [name]  is a 74 y.o. male,  with medical history significant of with history of asthma, CHF, depression, diabetes mellitus type 2, ESRD on hemodialysis Monday Wednesday Friday, CAD status post LAD PCI in 2023, chronic systolic CHF.  - Patient followed by renal transplant plant team at [facility], and he was referred for cardiac clearance, he went for cardiac cath at [facility] 04/17/2024, which did show high risk CAD, with ostial/proximal LAD focal calcification 90% stenosis, with large OMI with ostial 90% stenosis, options were for PCI to LAD, and OM medical treatment, versus CABG.  - Patient presents to ED today due to complaints of weakness, presents to ED with right-sided weakness, reports he finished his cardiac cath he felt some tingling numbness in the right upper arm which he thought initially secondary to his cardiac cath as it was accessed via right radial, but reports this morning he had significant weakness when he was standing up to go for dialysis and his right arm and right leg, unable to walk which prompted him to come to ED, reports some improvement in his movement currently.  - in  ED, brain MRI was significant for acute CVA, so patient admitted for further workup.    PT Comments  Patient presents seated at EOB and agreeable for therapy. Patient demonstrates fair/good return for ambulating in room, hallways and completing 360 degree turns without loss of balance, only mild difficulty advancing RLE. Patient tolerated sitting up at bedside after therapy. Patient will benefit from continued skilled physical therapy in hospital and recommended venue below to increase strength, balance, endurance for safe ADLs and gait.    If plan is discharge home, recommend the following: A little help with walking and/or transfers;A little help with  bathing/dressing/bathroom;Assistance with cooking/housework;Assist for transportation;Help with stairs or ramp for entrance   Can travel by private vehicle        Equipment Recommendations  None recommended by PT    Recommendations for Other Services       Precautions / Restrictions Precautions Precautions: Fall Recall of Precautions/Restrictions: Intact Restrictions Weight Bearing Restrictions Per Provider Order: No     Mobility  Bed Mobility Overal bed mobility: Needs Assistance Bed Mobility: Supine to Sit     Supine to sit: Supervision     General bed mobility comments: Seated at EOB to start the session.    Transfers Overall transfer level: Needs assistance Equipment used: None Transfers: Sit to/from Stand Sit to Stand: Contact guard assist           General transfer comment: good return for completing sit to stands without AD    Ambulation/Gait Ambulation/Gait assistance: Supervision Gait Distance (Feet): 100 Feet Assistive device: None Gait Pattern/deviations: Decreased step length - right, Decreased stride length, Decreased step length - left Gait velocity: Dec     General Gait Details: fair/good return for ambulating without use of an AD with mild difficulty advancing LLE, able to make 360 degree turns without loss of balance  Stairs             Wheelchair Mobility     Tilt Bed    Modified Rankin (Stroke Patients Only)       Balance Overall balance assessment: Needs assistance Sitting-balance support: Feet unsupported, No upper extremity supported Sitting balance-Leahy Scale: Good Sitting balance - Comments: seated at EOB   Standing balance support: During  functional activity, No upper extremity supported Standing balance-Leahy Scale: Fair Standing balance comment: without AD                            Communication Communication Communication: No apparent difficulties  Cognition Arousal: Alert Behavior During  Therapy: WFL for tasks assessed/performed   PT - Cognitive impairments: No apparent impairments                         Following commands: Intact      Cueing Cueing Techniques: Verbal cues  Exercises      General Comments        Pertinent Vitals/Pain Pain Assessment Pain Assessment: No/denies pain    Home Living                          Prior Function            PT Goals (current goals can now be found in the care plan section) Acute Rehab PT Goals Patient Stated Goal: return home with family to assist PT Goal Formulation: With patient Time For Goal Achievement: 05/03/24 Potential to Achieve Goals: Good Progress towards PT goals: Progressing toward goals    Frequency    Min 4X/week      PT Plan      Co-evaluation PT/OT/SLP Co-Evaluation/Treatment: Yes Reason for Co-Treatment: To address functional/ADL transfers PT goals addressed during session: Mobility/safety with mobility;Balance OT goals addressed during session: ADL's and self-care      AM-PAC PT 6 Clicks Mobility   Outcome Measure  Help needed turning from your back to your side while in a flat bed without using bedrails?: None Help needed moving from lying on your back to sitting on the side of a flat bed without using bedrails?: None Help needed moving to and from a bed to a chair (including a wheelchair)?: None Help needed standing up from a chair using your arms (e.g., wheelchair or bedside chair)?: None Help needed to walk in hospital room?: A Little Help needed climbing 3-5 steps with a railing? : A Little 6 Click Score: 22    End of Session   Activity Tolerance: Patient tolerated treatment well Patient left: in bed;with call bell/phone within reach;Other (comment) (left seated at EOB) Nurse Communication: Mobility status PT Visit Diagnosis: Other abnormalities of gait and mobility (R26.89);Other symptoms and signs involving the nervous system (R29.898);Muscle  weakness (generalized) (M62.81);Hemiplegia and hemiparesis Hemiplegia - Right/Left: Right Hemiplegia - dominant/non-dominant: Dominant Hemiplegia - caused by: Cerebral infarction     Time: 9160-9147 PT Time Calculation (min) (ACUTE ONLY): 13 min  Charges:    $Gait Training: 8-22 mins PT General Charges $$ ACUTE PT VISIT: 1 Visit                     12:22 PM, 04/24/2024 Lynwood Music, MPT Physical Therapist with [facility] [phone] office [phone] mobile phone

## 2024-04-24 NOTE — Progress Notes (Signed)
 Occupational Therapy Treatment Patient Details Name: [name] [name] MRN: [id number] DOB: Dec 19, 1950 Today's Date: 04/24/2024   History of present illness [name] [name]  is a 74 y.o. male,  with medical history significant of with history of asthma, CHF, depression, diabetes mellitus type 2, ESRD on hemodialysis Monday Wednesday Friday, CAD status post LAD PCI in 2023, chronic systolic CHF.  - Patient followed by renal transplant plant team at [facility], and he was referred for cardiac clearance, he went for cardiac cath at [facility] 04/17/2024, which did show high risk CAD, with ostial/proximal LAD focal calcification 90% stenosis, with large OMI with ostial 90% stenosis, options were for PCI to LAD, and OM medical treatment, versus CABG.  - Patient presents to ED today due to complaints of weakness, presents to ED with right-sided weakness, reports he finished his cardiac cath he felt some tingling numbness in the right upper arm which he thought initially secondary to his cardiac cath as it was accessed via right radial, but reports this morning he had significant weakness when he was standing up to go for dialysis and his right arm and right leg, unable to walk which prompted him to come to ED, reports some improvement in his movement currently.  - in  ED, brain MRI was significant for acute CVA, so patient admitted for further workup.   OT comments  Pt agreeable to OT and PT co-treatment. Pt demonstrating improved R UE strength and coordination with some deficits remaining, but improved compared to initial evaluation. Pt required CGA for ambulation and was able to walk over 100 feet in the hall without AD. Pt reports having his wife at home and having assist for transportation to outpatient therapy. Pt left in the bed with call bell within reach. Pt will benefit from continued OT in the hospital to increase strength, balance, and endurance for safe ADL's.         If plan is discharge home, recommend the  following:  A little help with walking and/or transfers;A little help with bathing/dressing/bathroom;Assistance with cooking/housework;Assist for transportation;Help with stairs or ramp for entrance   Equipment Recommendations  None recommended by OT          Precautions / Restrictions Precautions Precautions: Fall Recall of Precautions/Restrictions: Intact Restrictions Weight Bearing Restrictions Per Provider Order: No       Mobility Bed Mobility               General bed mobility comments: Seated at EOB to start the session.    Transfers Overall transfer level: Needs assistance   Transfers: Sit to/from Stand Sit to Stand: Contact guard assist           General transfer comment: Sit to stand followed by ambulation without AD.     Balance Overall balance assessment: Needs assistance Sitting-balance support: No upper extremity supported, Feet supported Sitting balance-Leahy Scale: Good Sitting balance - Comments: seated at EOB   Standing balance support: During functional activity, No upper extremity supported Standing balance-Leahy Scale: Fair Standing balance comment: without AD                           ADL either performed or assessed with clinical judgement   ADL Overall ADL's : Needs assistance/impaired                                     Functional  mobility during ADLs: Contact guard assist General ADL Comments: Able to ambualte in the hall without AD with CGA. Shows ability to be at level of CGA for standing ADL tasks at this time. Set up to mod I for seated ADL's.    Extremity/Trunk Assessment Upper Extremity Assessment Upper Extremity Assessment: RUE deficits/detail RUE Deficits / Details: 4+/5 shoulder flexion and abduction. 4+/5 gross grasp. WFL strength otherwise. Mild fine motor and gross motor deficits remain, but improved from evaluation. RUE Sensation: WNL RUE Coordination: decreased fine motor;decreased gross  motor   Lower Extremity Assessment Lower Extremity Assessment: Defer to PT evaluation        Vision   Vision Assessment?: No apparent visual deficits   Perception     Praxis     Communication Communication Communication: No apparent difficulties   Cognition Arousal: Alert Behavior During Therapy: WFL for tasks assessed/performed Cognition: No apparent impairments                               Following commands: Intact        Cueing   Cueing Techniques: Verbal cues  Exercises                   Pertinent Vitals/ Pain       Pain Assessment Pain Assessment: No/denies pain  Home Living                                          Prior Functioning/Environment              Frequency  Min 3X/week        Progress Toward Goals  OT Goals(current goals can now be found in the care plan section)  Progress towards OT goals: Progressing toward goals  Acute Rehab OT Goals Patient Stated Goal: Improve function. OT Goal Formulation: With patient Time For Goal Achievement: 05/03/24 Potential to Achieve Goals: Good ADL Goals Pt Will Perform Grooming: with modified independence;standing Pt Will Perform Lower Body Bathing: with modified independence Pt Will Perform Upper Body Dressing: with modified independence Pt Will Perform Lower Body Dressing: with modified independence Pt Will Transfer to Toilet: with modified independence;ambulating Pt Will Perform Toileting - Clothing Manipulation and hygiene: with modified independence Pt/caregiver will Perform Home Exercise Program: Increased strength;Right Upper extremity;Independently  Plan      Co-evaluation    PT/OT/SLP Co-Evaluation/Treatment: Yes Reason for Co-Treatment: To address functional/ADL transfers   OT goals addressed during session: ADL's and self-care                         End of Session Equipment Utilized During Treatment: Gait belt  OT Visit  Diagnosis: Unsteadiness on feet (R26.81);Other abnormalities of gait and mobility (R26.89);Muscle weakness (generalized) (M62.81);Other symptoms and signs involving the nervous system (R29.898)   Activity Tolerance Patient tolerated treatment well   Patient Left in bed;with call bell/phone within reach   Nurse Communication          Time: 9158-9146 OT Time Calculation (min): 12 min  Charges: OT General Charges $OT Visit: 1 Visit (No additional charge due to co-treat with PT.)  [name] [name] OT, MOT  [name] [name] 04/24/2024, 9:50 AM

## 2024-04-25 ENCOUNTER — Telehealth: Payer: Self-pay

## 2024-04-25 NOTE — Transitions of Care (Post Inpatient/ED Visit) (Signed)
 04/25/2024  Patient ID: [name] [name], male   DOB: 11-09-1950, 74 y.o.   MRN: [id number] Today's TOC FU Call Status: Today's TOC FU Call Status:: Successful TOC FU Call Completed TOC FU Call Complete Date: 04/25/24  Patient's Name and Date of Birth confirmed. Name, DOB  Transition Care Management Follow-up Telephone Call Date of Discharge: 04/24/24 [facility] Type of Discharge: Inpatient Admission Primary Inpatient Discharge Diagnosis:: Stroke (cerebrum) [facility]) How have you been since you were released from the hospital?: Better Any questions or concerns?: No  Items Reviewed: Did you receive and understand the discharge instructions provided?: Yes Medications obtained,verified, and reconciled?: No (04/25/2024 Answered call, but req CB 04/26/24 as just returned from HD since 0630, drove self, doing okay, no questions, did not have medication list with him to review, only one change he could think of. Will follow up 04/26/2024.) Medications Not Reviewed Reasons:: Other: (04/25/2024 Answered call, but req CB 04/26/24 as just returned from HD since 0630, drove self, doing okay, no questions, did not have medication list with him to review, only one change he could think of. Will follow up 04/26/2024.)  Medications Reviewed Today: See note below Medications Reviewed Today   Medications were not reviewed in this encounter   Assessment: From brief encounter over the phone today.  Alert and oriented on call, speech and language normal, states he drove self to HD this am and is doing okay.   Follow up appointments reviewed: Do you understand care options if your condition(s) worsen?: Yes-patient verbalized understanding  No goals addressed enough yet to document. See below comment note.   04/25/2024 Answered call, but requested a call back to complete initial outreach on 04/26/24 as just returned from HD since 0630, Patient stated he drove self to HD, he is doing okay, no questions, did  not have medication list with him to review, only one change he could think of.   Will follow up 04/26/2024 per patient request.    Bing Edison MSN, RN RN Case Manager [facility]  [facility] Office Hours M-F [phone] Direct Dial : [phone] Main Phone [phone]  Fax: [phone] [email or web address]

## 2024-04-26 ENCOUNTER — Ambulatory Visit: Admitting: Internal Medicine

## 2024-04-26 ENCOUNTER — Telehealth: Payer: Self-pay

## 2024-04-27 ENCOUNTER — Telehealth: Payer: Self-pay

## 2024-05-10 ENCOUNTER — Ambulatory Visit: Payer: Self-pay

## 2024-05-10 VITALS — BP 131/73 | HR 81 | Ht 72.0 in | Wt 223.0 lb

## 2024-05-10 DIAGNOSIS — I2510 Atherosclerotic heart disease of native coronary artery without angina pectoris: Secondary | ICD-10-CM | POA: Diagnosis not present

## 2024-05-10 MED ORDER — NITROGLYCERIN 0.4 MG SL SUBL: 0.4000 mg | SUBLINGUAL_TABLET | SUBLINGUAL | 11 refills | Status: AC | PRN

## 2024-05-10 NOTE — Progress Notes (Signed)
 "  Established Patient Office Visit  Subjective   Patient ID: [name] [name], male    DOB: 08-15-1950  Age: 74 y.o. MRN: [id number]  Chief Complaint  Patient presents with   Hospitalization Follow-up   Discussed the use of AI scribe software for clinical note transcription with the patient, who gave verbal consent to proceed.  History of Present Illness    [name] [name] is a 74 year old male with coronary artery disease and recent stroke who presents for follow-up after hospitalization.  Neurological deficits post-stroke - Experienced stroke during cardiac procedure resulting in right-sided paralysis and inability to ambulate for approximately four days - No voluntary movement of right arm initially; arm would move uncontrollably when attempting to raise it - Gradual improvement over five days with regained control of right arm and ability to walk - Currently ambulatory and reports feeling well with full mobility  Coronary artery disease and cardiac intervention - History of coronary artery disease with known arterial blockage identified two years ago - Intervention not performed previously due to blockage location - Previously prescribed Eliquis without resolution of blockage - Awaiting further recommendations regarding possible stenting or open-heart surgery - No use of nitroglycerin ; has never required it  Renal failure and dialysis - Undergoing hemodialysis three times weekly (Monday, Wednesday, Friday) from 6:30 to 10:30 AM - Phosphorus level currently 7.2, improved compared to hospitalization values - No new peripheral edema; persistent swelling in one leg     HPI Admit date:     04/18/2024  Discharge date: 04/24/2024  Discharge Physician: Deliliah Room    PCP: Tobie Suzzane POUR, MD    Recommendations at discharge:     F/u with your PCP in one week HD as per schedule F/u with cardiologist, Dr Ladona in one month for PCI Continue taking meds as prescribed   Discharge  Diagnoses: Principal Problem:   Stroke (cerebrum) (HCC) Active Problems:   ESRD (end stage renal disease) (HCC)   Type 2 diabetes mellitus in patient with obesity (HCC)   Obesity (BMI 30-39.9)   Heart failure with improved ejection fraction (HFimpEF) (HCC)   HLD (hyperlipidemia)   Hemodialysis-associated hypotension   Hospital Course:   74 y.o. male,  with medical history significant of with history of asthma, CHF, depression, diabetes mellitus type 2, ESRD on hemodialysis Monday Wednesday Friday, CAD status post LAD PCI in 2023, chronic systolic CHF. - Patient followed by renal transplant plant team at [facility], and he was referred for cardiac clearance, he went for cardiac cath at [facility] 04/17/2024, which did show high risk CAD, with ostial/proximal LAD focal calcification 90% stenosis, with large OMI with ostial 90% stenosis, options were for PCI to LAD, and OM medical treatment, versus CABG.   Patient presented to [facility] ED on 04/18/2025 due to complaints of weakness, presents to ED with right-sided weakness   Diagnosed with acute left internal capsule CVA.  Neurology consulted.   Nephrology consulted for ESRD management.   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 Was not a candidate for tPA as he recently had cardiac catheterization procedure done on 04/17/2024. - Continue with aspirin , Plavix  and statin. -Follow-up transthoracic echocardiogram-EF 60 to 65% with grade 1 diastolic dysfunction, no significant valvular normalities.  No interatrial shunt. - PT/OT/speech therapy consultations done. OPPT arranged. - Discussed with Dr Shelton and recommendation is to continue with DAPT  and statin and outpt neuro follow up. PT/OT recommended OPPT. -No indication for CIR placement as his right sided strength has improved and he is able to walk  independently now.   Coronary artery disease status post stenting, POA: Prior history of stenting.  Cardiac catheterization procedure done via right radial artery on 04/17/2024 showed high risk lesions.   Multidisciplinary cardiology team has decided to go with PCI. He will f/u with Dr Ladona in 4 weeks for PCI. I have informed Dr Ladona of patient's discharge from the hospital. Continue with aspirin , statin, metoprolol  and Plavix    Class I obesity: Dietary modifications and physical activity to facilitate weight loss. He may be a candidate for semaglutide (additional benefit in patients with CAD)   ESRD,POA: On HD  MWF -Nephrology consulted. Informed Dr Macel of his discharge home. -HD done in the hospital on 04/19/24 as well as 04/21/2024.  Hemodialysis done on 04/23/2024 but no ultrafiltration was done because machine was malfunctioning. -Continue with sevelamer .   Right thyroid  nodule,POA: -Outpatient thyroid  ultrasound   Type 2 diabetes mellitus,POA: -Well controlled   Essential Hypertension, POA:  Resumed home dose of metoprolol  succinate 25 mg p.o. daily.  Check BP at home daily.   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   Essential hypertension 12/28/2022   Hemodialysis-associated hypotension 12/28/2022   Stasis dermatitis 12/28/2022   HLD (hyperlipidemia) 11/30/2022   Dependence on renal dialysis 06/24/2022   Encounter for general adult medical examination with abnormal findings 06/24/2022   Preop cardiovascular exam 06/24/2022   Heart failure with improved ejection fraction (HFimpEF) (HCC) 08/10/2021   Gastroesophageal reflux disease 06/26/2021   CAD (coronary artery disease) 06/23/2021   Obesity (BMI 30-39.9) 05/27/2021   Anemia of chronic disease 05/27/2021   ESRD (end stage renal disease) (HCC) 05/26/2021    Type 2 diabetes mellitus in patient with obesity (HCC) 05/26/2021   Adrenal mass 11/19/2020   Benign neoplasm of colon 11/27/2019   Asthma, chronic, mild persistent, uncomplicated 05/23/2018   DOE (dyspnea on exertion) 04/24/2018   Pulmonary embolism (HCC) 04/24/2018    ROS    Objective:     BP 131/73   Pulse 81   Ht 6' (1.829 m)   Wt 223 lb (101.2 kg)   SpO2 94%   BMI 30.24 kg/m  BP Readings from Last 3 Encounters:  05/15/24 118/78  05/10/24 131/73  04/24/24 131/63   Wt Readings from Last 3 Encounters:  05/15/24 224 lb (101.6 kg)  05/10/24 223 lb (101.2 kg)  04/23/24 227 lb 1.2 oz (103 kg)     Physical Exam Vitals and nursing note reviewed.  Constitutional:      Appearance: Normal appearance. He is obese.  HENT:     Head: Normocephalic.     Right Ear: Tympanic membrane, ear canal and external ear normal.     Left Ear: Tympanic membrane, ear canal and external ear normal.     Nose: Nose normal.     Mouth/Throat:     Mouth: Mucous membranes are moist.     Pharynx: Oropharynx is clear.  Eyes:     Extraocular Movements: Extraocular movements intact.     Pupils: Pupils are equal, round, and reactive to light.  Cardiovascular:     Rate and Rhythm: Normal rate and regular rhythm.  Pulmonary:     Effort: Pulmonary effort is  normal.     Breath sounds: Normal breath sounds.  Abdominal:     General: Abdomen is flat. Bowel sounds are normal.     Palpations: Abdomen is soft.  Musculoskeletal:        General: Normal range of motion.     Cervical back: Normal range of motion and neck supple.  Skin:    General: Skin is warm and dry.  Neurological:     Mental Status: He is alert and oriented to person, place, and time.  Psychiatric:        Mood and Affect: Mood normal.        Thought Content: Thought content normal.      No results found for any visits on 05/10/24.    The ASCVD Risk score (Arnett DK, et al., 2019) failed to calculate for the following  reasons:   Risk score cannot be calculated because patient has a medical history suggesting prior/existing ASCVD   * - Cholesterol units were assumed    Assessment & Plan:   Problem List Items Addressed This Visit       Cardiovascular and Mediastinum   CAD (coronary artery disease) - Primary (Chronic)   Atherosclerotic heart disease with previous blockage. Recent stroke during procedure with transient right-sided paralysis. Prefers stent over surgery. - Scheduled heart catheterization on February 27th. - Refilled nitroglycerin  prescription at [facility]. - Advised to take nitroglycerin  sublingually if needed, even if expired.      Relevant Medications   nitroGLYCERIN  (NITROSTAT ) 0.4 MG SL tablet    Return in about 9 months (around 02/07/2025) for chronic follow-up with PCP.    Leita Longs, FNP  "

## 2024-05-14 NOTE — Progress Notes (Unsigned)
 " Cardiology Office Note:  .   Date:  05/14/2024  ID:  [name] [name], DOB [birth year], MRN [id number] PCP: [name] Suzzane POUR, MD  [facility] Providers Cardiologist:  [name] SHAUNNA Maywood, MD { Click to update primary MD,subspecialty MD or APP then REFRESH:1}   History of Present Illness: .   [name] [name] is a 74 y.o. 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   Pertinent cardiac medical history:  CAD  s/p LAD PCI in 05/2021 NM Stress test at [facility] in 11/2022 showed no ischemia but EF of 36%.  Follow-up echo showed EF 50 to 55%. NM stress test in 02/2024 showed possible ischemia in the LAD territory versus artifact.  Nuclear LVEF was 58%.  Moderate to severe coronary artery calcifications were noted on attenuation CT scan.  HFimpEF 35 to 40% in 2022 improved to 55% in 2023, 50-55% in 2024.  Echo 04/19/2024 showed EF 60 to 65%, G1 DD, moderately dilated LA, mildly dilated LA, no significant valvular abnormalities, negative bubble study PVC  Heart monitor 05/2022 showed 11% PVC burden  Last seen in heartcare 04/03/2024 by Dr. Mallipeddi for follow-up.  Patient was asymptomatic at that time, however scheduled for LHC as part of preop evaluation for kidney transplant due to abnormal stress test.  Also ordered 2-week live ZIO, which is still pending.  Continued on Lipitor  80 mg daily, Plavix  75 mg daily, metoprolol  XL 25 mg twice daily, midodrine 5 mg every other day, NTG as needed.  Outpatient Cath 04/17/2024 showed high risk CAD with ostial/proximal LAD focal calcific 90% stenosis and a large OM1 with ostial 90% stenosis.  No interventions at that time.  Considered CABG versus PCI.  Recommended DAPT for minimum of 6 months.  Per heart team multidisciplinary discussion note on 04/20/2024, CABG considered but patient preference  of anatomy favor PCI for LAD only and medical management of OM lesion after recovery from CVA.  Hospitalized 04/18/2024-04/24/2024 and diagnosed with acute left internal capsule CVA.  He was not a candidate for tPA due to recent cardiac cath.  Echo 04/19/2024 was normal as above.  Continued on ASA 81 mg daily, Lipitor  80 mg daily, Plavix  75 mg daily, Toprol -XL 25 mg daily, midodrine 5 mg every other day before HD, NTG as needed.  Today, reports ### and denies ###.  Denies chest pain, shortness of breath, palpitations, syncope, presyncope, dizziness, orthopnea, PND, swelling or significant weight changes, acute bleeding, or claudication.   Reports compliance with medications.  Dietary habitats:  Activity level:  Social: Denies tobacco use/alcohol/drug use  Denies any recent hospitalizations or visits to the emergency department.   ROS: 10 point review of system has been reviewed and considered negative except ones been listed in the HPI.   Studies Reviewed: .   Cardiac Catheterization 04/17/2024: Hemodynamic data: LV 130/36, EDP 14 mmHg.  Ao 138/73, mean 102 mmHg.  There is no pressure gradient across the aortic valve.   Angiographic data: LM: Mildly calcified, widely patent. LAD: Severely calcified in the proximal segment.  There is a ostial proximal 90% stenosis followed by diffuse long segment 50% stenosis and a previously placed proximal to mid LAD stent (3.0 x 18 mm Onyx on 05/26/2021) has 30 to 40% ISR.  Mid to distal LAD has mild disease. LCx: Large vessel, gives origin to large OM1 and  large OM 3.  OM1 has ostial 90% stenosis. RCA: Large-caliber vessel and a dominant vessel.  Has anterior origin.  Has calcific diffuse proximal and ostial 30% stenosis.      Impression and recommendations: Patient with high risk  CAD with ostial/proximal LAD focal calcific 90% stenosis and large OM1 with ostial 90% stenosis.  Options would be PCI to LAD and treat OM medically versus consider CABG, will  discuss during Heart Team Meeting.   ECHO IMPRESSIONS 04/19/2024  1. A small linear mobile echodensity attached to the papillary muscle is  seen flickering in and out of the LV cavity. Unclear if it is artifact due  to suboptimal image quality. Best seen in images 78 and 79. Consider  further evaluation with TEE.SABRA Left  ventricular ejection fraction, by estimation, is 60 to 65%. The left  ventricle has normal function. The left ventricle has no regional wall  motion abnormalities. Left ventricular diastolic parameters are consistent  with Grade I diastolic dysfunction  (impaired relaxation).   2. Right ventricular systolic function is normal. The right ventricular  size is normal. Tricuspid regurgitation signal is inadequate for assessing  PA pressure.   3. Left atrial size was moderately dilated.   4. Right atrial size was mildly dilated.   5. The mitral valve is normal in structure. No evidence of mitral valve  regurgitation. No evidence of mitral stenosis.   6. The aortic valve was not well visualized. Aortic valve regurgitation  is not visualized. No aortic stenosis is present.   7. The inferior vena cava is dilated in size with >50% respiratory  variability, suggesting right atrial pressure of 8 mmHg.   8. Agitated saline contrast bubble study was negative, with no evidence  of any interatrial shunt.   CV Studies: Cardiac studies reviewed are outlined and summarized above. Otherwise please see EMR for full report.   Risk Assessment/Calculations:   {Does this patient have ATRIAL FIBRILLATION?[phone]} No BP recorded.  {Refresh Note OR Click here to enter BP  :1}***       Physical Exam:   VS:  There were no vitals taken for this visit.   Wt Readings from Last 3 Encounters:  05/10/24 223 lb (101.2 kg)  04/23/24 227 lb 1.2 oz (103 kg)  04/17/24 222 lb (100.7 kg)    GEN: Well nourished, well developed in no acute distress while sitting in chair.  NECK: No JVD; No carotid  bruits CARDIAC: ***RRR, no murmurs, rubs, gallops RESPIRATORY:  Clear to auscultation without rales, wheezing or rhonchi  ABDOMEN: Soft, non-tender, non-distended EXTREMITIES:  No edema; No deformity   ASSESSMENT AND PLAN: .   ***    {Are you ordering a CV Procedure (e.g. stress test, cath, DCCV, TEE, etc)?   Press F2        :[id number]}  Dispo: ***  Signed, Lorette CINDERELLA Kapur, PA-C  "

## 2024-05-14 NOTE — H&P (View-Only) (Signed)
 " Cardiology Office Note:  .   Date:  05/14/2024  ID:  [name] [name], DOB [birth year], MRN [id number] PCP: Tobie Suzzane POUR, MD  [facility] Providers Cardiologist:  Diannah SHAUNNA Maywood, MD {  History of Present Illness: .   [name] [name] is a 74 y.o. 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   ADDENDUM 05/16/2024: [name] changed to [name].   Pertinent cardiac medical history:  CAD  s/p LAD PCI in 05/2021 NM Stress test at [facility] in 11/2022 showed no ischemia but EF of 36%.  Follow-up echo showed EF 50 to 55%. NM stress test in 02/2024 showed possible ischemia in the LAD territory versus artifact.  Nuclear LVEF was 58%.  Moderate to severe coronary artery calcifications were noted on attenuation CT scan.  HFimpEF 35 to 40% in 2022 improved to 55% in 2023, 50-55% in 2024.  Echo 04/19/2024 showed EF 60 to 65%, G1 DD, moderately dilated LA, mildly dilated LA, no significant valvular abnormalities, negative bubble study PVC  Heart monitor 05/2022 showed 11% PVC burden  Last seen in heartcare 04/03/2024 by Dr. Mallipeddi for follow-up.  Patient was asymptomatic at that time, however scheduled for LHC as part of preop evaluation for kidney transplant due to abnormal stress test.  Also ordered 2-week live ZIO, which is still pending.  Continued on Lipitor  80 mg daily, Plavix  75 mg daily, metoprolol  XL 25 mg twice daily, midodrine  5 mg every other day, NTG as needed.  Outpatient cath 04/17/2024: High-risk CAD with 90% calcific ostial/proximal LAD stenosis and 90% ostial OM1 stenosis; also prior mid LAD DES with 30-40% ISR and mild RCA disease; no intervention at that time, CABG vs PCI discussed, and per Heart Team 04/20/2024, anatomy and patient preference favored PCI to LAD only with medical management of OM after recovery from CVA,  with recommendation for DAPT >=6 months.  Hospitalized 04/18/2024-04/24/2024 and diagnosed with acute left internal capsule CVA.  He was not a candidate for tPA due to recent cardiac cath.  Echo 04/19/2024 was normal as above.  Continued on ASA 81 mg daily, Lipitor  80 mg daily, Plavix  75 mg daily, Toprol -XL 25 mg daily, midodrine  5 mg every other day before HD, NTG as needed.  Today, he reports doing well overall with no residual neurologic deficits from his prior CVA and no therapy required. He denies chest pain, shortness of breath, palpitations, syncope, dizziness, orthopnea, PND, edema, claudication, bleeding, or significant weight changes. He reports medication compliance, ambulates independently at home and with a cart in stores, remains on HD MWF awaiting transplant, and has intermittent intradialytic hypotension (SBP ~80 mmHg) controlled with midodrine . Patient confirms that heart monitor was worn and mailed back on Saturday.   ROS: 10 point review of system has been reviewed and considered negative except ones been listed in the HPI.   Studies Reviewed: SABRA   EKG Interpretation Date/Time:  Tuesday May 15 2024 12:29:31 EST Ventricular Rate:  53 PR Interval:  202 QRS Duration:  112 QT Interval:  470 QTC Calculation: 441 R Axis:   84  Text Interpretation: Sinus bradycardia Low voltage QRS When compared with ECG of 18-Apr-2024 11:54, No significant change was found Confirmed by Sheron Hallmark ([id number]) on 05/15/2024 12:39:33 PM   Cardiac Catheterization 04/17/2024: Hemodynamic data: LV 130/36, EDP 14 mmHg.  Ao 138/73, mean 102  mmHg.  There is no pressure gradient across the aortic valve.   Angiographic data: LM: Mildly calcified, widely patent. LAD: Severely calcified in the proximal segment.  There is a ostial proximal 90% stenosis followed by diffuse long segment 50% stenosis and a previously placed proximal to mid LAD stent (3.0 x 18 mm Onyx on 05/26/2021) has 30 to 40% ISR.  Mid to  distal LAD has mild disease. LCx: Large vessel, gives origin to large OM1 and large OM 3.  OM1 has ostial 90% stenosis. RCA: Large-caliber vessel and a dominant vessel.  Has anterior origin.  Has calcific diffuse proximal and ostial 30% stenosis.      Impression and recommendations: Patient with high risk  CAD with ostial/proximal LAD focal calcific 90% stenosis and large OM1 with ostial 90% stenosis.  Options would be PCI to LAD and treat OM medically versus consider CABG, will discuss during Heart Team Meeting.   ECHO IMPRESSIONS 04/19/2024  1. A small linear mobile echodensity attached to the papillary muscle is  seen flickering in and out of the LV cavity. Unclear if it is artifact due  to suboptimal image quality. Best seen in images 78 and 79. Consider  further evaluation with TEE.SABRA Left  ventricular ejection fraction, by estimation, is 60 to 65%. The left  ventricle has normal function. The left ventricle has no regional wall  motion abnormalities. Left ventricular diastolic parameters are consistent  with Grade I diastolic dysfunction  (impaired relaxation).   2. Right ventricular systolic function is normal. The right ventricular  size is normal. Tricuspid regurgitation signal is inadequate for assessing  PA pressure.   3. Left atrial size was moderately dilated.   4. Right atrial size was mildly dilated.   5. The mitral valve is normal in structure. No evidence of mitral valve  regurgitation. No evidence of mitral stenosis.   6. The aortic valve was not well visualized. Aortic valve regurgitation  is not visualized. No aortic stenosis is present.   7. The inferior vena cava is dilated in size with >50% respiratory  variability, suggesting right atrial pressure of 8 mmHg.   8. Agitated saline contrast bubble study was negative, with no evidence  of any interatrial shunt.   CV Studies: Cardiac studies reviewed are outlined and summarized above. Otherwise please see EMR for full  report.   Physical Exam:   VS:  There were no vitals taken for this visit.   Wt Readings from Last 3 Encounters:  05/10/24 223 lb (101.2 kg)  04/23/24 227 lb 1.2 oz (103 kg)  04/17/24 222 lb (100.7 kg)    GEN: Well nourished, well developed in no acute distress while sitting in chair.  NECK: No JVD; No carotid bruits CARDIAC: RRR, no murmurs, rubs, gallops RESPIRATORY:  Clear to auscultation without rales, wheezing or rhonchi  ABDOMEN: Soft, non-tender, non-distended EXTREMITIES:  No edema; No deformity   ASSESSMENT AND PLAN: .   Labs reviewed: K 4.2/Cr 10.4/CBC stable with Hgb 10.7/ LDL 46, TG 57 in 04/2024  Coronary artery disease involving native coronary artery of native heart without angina pectoris Hyperlipidemia LDL goal <70 Severe multivessel CAD s/p LAD PCI (05/2021) with recent LHC (04/17/2024) showing 90% ostial/proximal LAD and 90% ostial OM1 stenosis, prior LAD DES with mild ISR, and mild RCA disease. Per Heart Team (04/20/2024), plan for PCI to LAD only with medical management of OM after recovery from CVA. Discussed consent for PCI. Patient agreed to proceed. Scheduled 2/5 with Dr. Margaretann. Order  CBC and BMP w/in 30 days of cath.   Reviewed EKG 04/18/2024: sinus bradycardia, HR 58, without acute ischemic changes.  Reviewed EKG today: Sinus bradycardia, HR without acute ischemic changes. Discussed the importance of compliance with medication due to severe CAD as above. Encouraged to take statin at night.  Continue aspirin  81 mg daily, clopidogrel  75 mg daily, atorvastatin  80 mg daily, metoprolol  succinate 25 mg daily, nitroglycerin  SL PRN.   Heart failure with improved ejection fraction (HFimpEF) (HCC) EF improved from 35-40% in 2022 to 50-55% in 2023-2024 to 60-65% 04/2024. Currently euvolemic and asymptomatic. Denies SOB, edema, orthopnea, or PND. Appears Euvolemic on exam.  Continue on midodrine  5 mg every other day.  GDMT limited due to kidney disease.  Encouraged low  sodium diet, fluid restriction <2L, and daily weights.   PVC's (premature ventricular contractions) Prior monitor (05/2022) showed ~11% PVC burden. Continue metoprolol  succinate 25 mg daily. Await results of pending 2-week ZIO monitor.  Patient confirmed monitor was returned on Saturday.  ESRD on hemodialysis [facility]) On HD since 2022; undergoing kidney transplant evaluation at [facility] Continue midodrine  5 mg every other day prior to HD. Continue coordination with nephrology and transplant team. Will need preop clearance for kidney transplant in next follow up.   Type 2 diabetes mellitus with complication, without long-term current use of insulin  (HCC) Not on any medications since weight lost.   Cerebrovascular accident (CVA), unspecified mechanism (HCC) Left internal capsule infarct; no recurrent neurologic symptoms. Continue aspirin , clopidogrel , and high-intensity statin. Neurology follow-up as scheduled.   Dispo: Follow up in 1 month   Signed, Lorette CINDERELLA Kapur, PA-C    Informed Consent   Shared Decision Making/Informed Consent The risks [stroke (1 in 1000), death (1 in 1000), kidney failure [usually temporary] (1 in 500), bleeding (1 in 200), allergic reaction [possibly serious] (1 in 200)], benefits (diagnostic support and management of coronary artery disease) and alternatives of a cardiac catheterization were discussed in detail with [name] [name] and he is willing to proceed.   "

## 2024-05-15 ENCOUNTER — Ambulatory Visit: Attending: Physician Assistant | Admitting: Physician Assistant

## 2024-05-15 ENCOUNTER — Other Ambulatory Visit: Payer: Self-pay | Admitting: Physician Assistant

## 2024-05-15 ENCOUNTER — Encounter: Payer: Self-pay | Admitting: Physician Assistant

## 2024-05-15 VITALS — BP 118/78 | HR 60 | Ht 72.0 in | Wt 224.0 lb

## 2024-05-15 DIAGNOSIS — I2510 Atherosclerotic heart disease of native coronary artery without angina pectoris: Secondary | ICD-10-CM

## 2024-05-15 DIAGNOSIS — I5020 Unspecified systolic (congestive) heart failure: Secondary | ICD-10-CM

## 2024-05-15 DIAGNOSIS — I639 Cerebral infarction, unspecified: Secondary | ICD-10-CM

## 2024-05-15 DIAGNOSIS — I493 Ventricular premature depolarization: Secondary | ICD-10-CM | POA: Diagnosis not present

## 2024-05-15 DIAGNOSIS — E785 Hyperlipidemia, unspecified: Secondary | ICD-10-CM | POA: Diagnosis not present

## 2024-05-15 DIAGNOSIS — Z992 Dependence on renal dialysis: Secondary | ICD-10-CM | POA: Diagnosis not present

## 2024-05-15 DIAGNOSIS — N186 End stage renal disease: Secondary | ICD-10-CM

## 2024-05-15 DIAGNOSIS — E118 Type 2 diabetes mellitus with unspecified complications: Secondary | ICD-10-CM

## 2024-05-15 NOTE — Patient Instructions (Addendum)
 Medication Instructions:  Your physician recommends that you continue on your current medications as directed. Please refer to the Current Medication list given to you today.  Labwork: BMET & CBC today or tomorrow Non-fasting [facility] ([address] [place]) or [facility]  Testing/Procedures: Your physician has requested that you have a cardiac catheterization. Cardiac catheterization is used to diagnose and/or treat various heart conditions. Doctors may recommend this procedure for a number of different reasons. The most common reason is to evaluate chest pain. Chest pain can be a symptom of coronary artery disease (CAD), and cardiac catheterization can show whether plaque is narrowing or blocking your hearts arteries. This procedure is also used to evaluate the valves, as well as measure the blood flow and oxygen  levels in different parts of your heart. For further information please visit [email or web address] Please follow instruction sheet, as given.  Follow-Up: Your physician recommends that you schedule a follow-up appointment in: 1 month  Any Other Special Instructions Will Be Listed Below (If Applicable).  If you need a refill on your cardiac medications before your next appointment, please call your pharmacy.   [facility] A DEPT OF [place]. [facility] [place] [address] [place] [address] Dept: [phone] Loc: [phone]  Rion Catala  05/15/2024  You are scheduled for a Cardiac Catheterization on Tuesday, May 24, 2024 with Dr. Gordy Bergamo.  1. Please arrive at the [facility] (Main Entrance A) at [facility]: [address] [place], [place] [address] at 10:00 AM (This time is 2 hour(s) before your procedure to ensure your preparation).   Free valet parking service is available. You will check in at ADMITTING. The support person will be asked to wait in the waiting room.  It is OK to have  someone drop you off and come back when you are ready to be discharged.    Special note: Every effort is made to have your procedure done on time. Please understand that emergencies sometimes delay scheduled procedures.  2. Diet: Nothing to eat after midnight.   3. Hydration: On February 3, you may drink approved liquids (see below) until 2 hours before the procedure time.       List of approved liquids water , clear juice, clear tea, black coffee, fruit juices, non-citric and without pulp, carbonated beverages, Gatorade, Kool -Aid, plain Jello-O and plain ice popsicles.  4. Labs: You will need to have blood drawn on Tuesday, January 29 th or 30 th, 2026 at . You do not need to be fasting.  5. Medication instructions in preparation for your procedure:   Contrast Allergy: No  On the morning of your procedure, take your Aspirin  81 mg and any morning medicines NOT listed above.  You may use sips of water .  6. Plan to go home the same day, you will only stay overnight if medically necessary. 7. Bring a current list of your medications and current insurance cards. 8. You MUST have a responsible person to drive you home. 9. Someone MUST be with you the first 24 hours after you arrive home or your discharge will be delayed. 10. Please wear clothes that are easy to get on and off and wear slip-on shoes.  Thank you for allowing us  to care for you!   -- [facility]

## 2024-05-17 ENCOUNTER — Telehealth: Payer: Self-pay | Admitting: Internal Medicine

## 2024-05-17 DIAGNOSIS — I493 Ventricular premature depolarization: Secondary | ICD-10-CM

## 2024-05-17 NOTE — Assessment & Plan Note (Signed)
 Atherosclerotic heart disease with previous blockage. Recent stroke during procedure with transient right-sided paralysis. Prefers stent over surgery. - Scheduled heart catheterization on February 27th. - Refilled nitroglycerin  prescription at [facility]. - Advised to take nitroglycerin  sublingually if needed, even if expired.

## 2024-05-17 NOTE — Telephone Encounter (Signed)
" °*  STAT* If patient is at the pharmacy, call can be transferred to refill team.   1. Which medications need to be refilled? (please list name of each medication and dose if known)   clopidogrel  (PLAVIX ) 75 MG tablet    2. Would you like to learn more about the convenience, safety, & potential cost savings by using the [facility]? no   3. Are you open to using the [facility] (Type [facility]. no   4. Which pharmacy/location (including street and city if local pharmacy) is medication to be sent to? no  [facility] - [place], [place] - [address]    5. Do they need a 30 day or 90 day supply? 30 day   "

## 2024-05-18 ENCOUNTER — Ambulatory Visit: Payer: Self-pay | Admitting: Physician Assistant

## 2024-05-18 LAB — BASIC METABOLIC PANEL WITH GFR
BUN/Creatinine Ratio: 4 — ABNORMAL LOW (ref 10–24)
BUN: 31 mg/dL — ABNORMAL HIGH (ref 8–27)
CO2: 26 mmol/L (ref 20–29)
Calcium: 10 mg/dL (ref 8.6–10.2)
Chloride: 95 mmol/L — ABNORMAL LOW (ref 96–106)
Creatinine, Ser: 8.13 mg/dL — ABNORMAL HIGH (ref 0.76–1.27)
Glucose: 88 mg/dL (ref 70–99)
Potassium: 4.4 mmol/L (ref 3.5–5.2)
Sodium: 142 mmol/L (ref 134–144)
eGFR: 6 mL/min/{1.73_m2} — ABNORMAL LOW

## 2024-05-18 LAB — CBC
Hematocrit: 38.1 % (ref 37.5–51.0)
Hemoglobin: 12.6 g/dL — ABNORMAL LOW (ref 13.0–17.7)
MCH: 29.8 pg (ref 26.6–33.0)
MCHC: 33.1 g/dL (ref 31.5–35.7)
MCV: 90 fL (ref 79–97)
Platelets: 202 10*3/uL (ref 150–450)
RBC: 4.23 x10E6/uL (ref 4.14–5.80)
RDW: 13.2 % (ref 11.6–15.4)
WBC: 4.4 10*3/uL (ref 3.4–10.8)

## 2024-05-18 MED ORDER — CLOPIDOGREL BISULFATE 75 MG PO TABS: 75.0000 mg | ORAL_TABLET | Freq: Every day | ORAL | 11 refills | Status: AC

## 2024-05-18 NOTE — Telephone Encounter (Signed)
 REFILL SENT

## 2024-05-22 ENCOUNTER — Telehealth: Payer: Self-pay | Admitting: *Deleted

## 2024-05-22 NOTE — Telephone Encounter (Addendum)
 Cardiac Catheterization scheduled at [facility] for: Thursday May 24, 2024 12 Noon Arrival time [facility] Main Entrance A at: 10 AM  Diet: -May have light meal until 6 AM. (6 hours before procedure time) Approved light meal consists of plain toast, fruit, light soups, crackers.   Hydration: -May drink clear liquids until 2 hours before the procedure.  Approved liquids: Water , clear tea, black coffee, fruit juices-non-citric and without pulp,Gatorade, plain Jello/popsicles.  No PO hydration (bottle of water )-dialysis patient  Medication instructions: -Usual morning medications can be taken including aspirin  81 mg and Plavix  75 mg  Plan to go home the same day, you will only stay overnight if medically necessary.  You must have responsible adult to drive you home.  Someone must be with you the first 24 hours after you arrive home.  Reviewed procedure instructions with patient.

## 2024-05-24 ENCOUNTER — Other Ambulatory Visit: Payer: Self-pay

## 2024-05-24 ENCOUNTER — Observation Stay (HOSPITAL_COMMUNITY)
Admission: RE | Admit: 2024-05-24 | Discharge: 2024-05-25 | Disposition: A | Attending: Cardiology | Admitting: Cardiology

## 2024-05-24 ENCOUNTER — Encounter (HOSPITAL_COMMUNITY): Admission: RE | Disposition: A | Payer: Self-pay | Source: Home / Self Care | Attending: Cardiology

## 2024-05-24 DIAGNOSIS — Z9861 Coronary angioplasty status: Principal | ICD-10-CM

## 2024-05-24 DIAGNOSIS — Z955 Presence of coronary angioplasty implant and graft: Secondary | ICD-10-CM

## 2024-05-24 LAB — POCT ACTIVATED CLOTTING TIME
Activated Clotting Time: 230 s
Activated Clotting Time: 256 s

## 2024-05-24 LAB — GLUCOSE, CAPILLARY: Glucose-Capillary: 94 mg/dL (ref 70–99)

## 2024-05-24 MED ORDER — FENTANYL CITRATE (PF) 100 MCG/2ML IJ SOLN
INTRAMUSCULAR | Status: AC
  Filled 2024-05-24: qty 2

## 2024-05-24 MED ORDER — LIDOCAINE HCL (PF) 1 % IJ SOLN
INTRAMUSCULAR | Status: DC | PRN
  Administered 2024-05-24: 10 mL

## 2024-05-24 MED ORDER — CLOPIDOGREL BISULFATE 300 MG PO TABS
ORAL_TABLET | ORAL | Status: AC
  Filled 2024-05-24: qty 1

## 2024-05-24 MED ORDER — LABETALOL HCL 5 MG/ML IV SOLN: 10.0000 mg | INTRAVENOUS | Status: AC | PRN

## 2024-05-24 MED ORDER — SODIUM CHLORIDE 0.9 % IV SOLN: 250.0000 mL | INTRAVENOUS | Status: DC | PRN

## 2024-05-24 MED ORDER — HEPARIN SODIUM (PORCINE) 1000 UNIT/ML IJ SOLN
INTRAMUSCULAR | Status: DC | PRN
  Administered 2024-05-24: 3000 [IU] via INTRAVENOUS
  Administered 2024-05-24: 1000 [IU] via INTRAVENOUS
  Administered 2024-05-24: 8000 [IU] via INTRAVENOUS

## 2024-05-24 MED ORDER — ATORVASTATIN CALCIUM 80 MG PO TABS
80.0000 mg | ORAL_TABLET | Freq: Every day | ORAL | Status: DC
  Administered 2024-05-25: 80 mg via ORAL
  Filled 2024-05-24: qty 1

## 2024-05-24 MED ORDER — PANTOPRAZOLE SODIUM 40 MG PO TBEC
40.0000 mg | DELAYED_RELEASE_TABLET | Freq: Every day | ORAL | Status: DC
  Administered 2024-05-25: 40 mg via ORAL
  Filled 2024-05-24: qty 1

## 2024-05-24 MED ORDER — MIDAZOLAM HCL (PF) 2 MG/2ML IJ SOLN
INTRAMUSCULAR | Status: DC | PRN
  Administered 2024-05-24: 2 mg via INTRAVENOUS

## 2024-05-24 MED ORDER — VERAPAMIL HCL 2.5 MG/ML IV SOLN
INTRAVENOUS | Status: AC
  Filled 2024-05-24: qty 2

## 2024-05-24 MED ORDER — HEPARIN (PORCINE) IN NACL 1000-0.9 UT/500ML-% IV SOLN
INTRAVENOUS | Status: DC | PRN
  Administered 2024-05-24: 1000 mL via SURGICAL_CAVITY

## 2024-05-24 MED ORDER — SODIUM CHLORIDE 0.9 % IV BOLUS
INTRAVENOUS | Status: AC | PRN
  Administered 2024-05-24: 250 mL via INTRAVENOUS

## 2024-05-24 MED ORDER — METOPROLOL SUCCINATE ER 25 MG PO TB24
25.0000 mg | ORAL_TABLET | Freq: Two times a day (BID) | ORAL | Status: DC
  Administered 2024-05-24 – 2024-05-25 (×2): 25 mg via ORAL
  Filled 2024-05-24 (×2): qty 1

## 2024-05-24 MED ORDER — HEPARIN SODIUM (PORCINE) 1000 UNIT/ML IJ SOLN
INTRAMUSCULAR | Status: AC
  Filled 2024-05-24: qty 10

## 2024-05-24 MED ORDER — CLOPIDOGREL BISULFATE 75 MG PO TABS
75.0000 mg | ORAL_TABLET | Freq: Every day | ORAL | Status: DC
  Administered 2024-05-25: 75 mg via ORAL
  Filled 2024-05-24: qty 1

## 2024-05-24 MED ORDER — ACETAMINOPHEN 325 MG PO TABS: 650.0000 mg | ORAL_TABLET | ORAL | Status: DC | PRN

## 2024-05-24 MED ORDER — FENTANYL CITRATE (PF) 100 MCG/2ML IJ SOLN
INTRAMUSCULAR | Status: DC | PRN
  Administered 2024-05-24 (×2): 50 ug via INTRAVENOUS
  Administered 2024-05-24: 25 ug via INTRAVENOUS

## 2024-05-24 MED ORDER — CLOPIDOGREL BISULFATE 300 MG PO TABS
ORAL_TABLET | ORAL | Status: DC | PRN
  Administered 2024-05-24: 300 mg via ORAL

## 2024-05-24 MED ORDER — MIDODRINE HCL 5 MG PO TABS
5.0000 mg | ORAL_TABLET | ORAL | Status: DC
  Administered 2024-05-25: 5 mg via ORAL
  Filled 2024-05-24: qty 1

## 2024-05-24 MED ORDER — SODIUM CHLORIDE 0.9 % IV SOLN: INTRAVENOUS | Status: DC

## 2024-05-24 MED ORDER — ONDANSETRON HCL 4 MG/2ML IJ SOLN: 4.0000 mg | Freq: Four times a day (QID) | INTRAMUSCULAR | Status: DC | PRN

## 2024-05-24 MED ORDER — IOHEXOL 350 MG/ML SOLN
INTRAVENOUS | Status: DC | PRN
  Administered 2024-05-24: 180 mL via INTRA_ARTERIAL

## 2024-05-24 MED ORDER — ASPIRIN 81 MG PO TBEC
81.0000 mg | DELAYED_RELEASE_TABLET | Freq: Every day | ORAL | Status: DC
  Administered 2024-05-25: 81 mg via ORAL
  Filled 2024-05-24: qty 1

## 2024-05-24 MED ORDER — ASPIRIN 81 MG PO CHEW: 81.0000 mg | CHEWABLE_TABLET | ORAL | Status: DC

## 2024-05-24 MED ORDER — SODIUM CHLORIDE 0.9% FLUSH: 3.0000 mL | INTRAVENOUS | Status: DC | PRN

## 2024-05-24 MED ORDER — LIDOCAINE HCL (PF) 1 % IJ SOLN
INTRAMUSCULAR | Status: AC
  Filled 2024-05-24: qty 30

## 2024-05-24 MED ORDER — MIDAZOLAM HCL 2 MG/2ML IJ SOLN
INTRAMUSCULAR | Status: AC
  Filled 2024-05-24: qty 2

## 2024-05-24 MED ORDER — SODIUM CHLORIDE 0.9% FLUSH
3.0000 mL | Freq: Two times a day (BID) | INTRAVENOUS | Status: DC
  Administered 2024-05-24 – 2024-05-25 (×2): 3 mL via INTRAVENOUS

## 2024-05-24 MED ORDER — NITROGLYCERIN 0.4 MG SL SUBL: 0.4000 mg | SUBLINGUAL_TABLET | SUBLINGUAL | Status: DC | PRN

## 2024-05-24 NOTE — Plan of Care (Signed)

## 2024-05-24 NOTE — Interval H&P Note (Signed)
 History and Physical Interval Note:  05/24/2024 4:16 PM  Sinan Tuch  has presented today for surgery, with the diagnosis of cad.  The various methods of treatment have been discussed with the patient and family. After consideration of risks, benefits and other options for treatment, the patient has consented to  Procedures: CORONARY STENT INTERVENTION (N/A) as a surgical intervention.  The patient's history has been reviewed, patient examined, no change in status, stable for surgery.  I have reviewed the patient's chart and labs.  Questions were answered to the patient's satisfaction.     Gordy Bergamo

## 2024-05-25 ENCOUNTER — Encounter (HOSPITAL_COMMUNITY): Payer: Self-pay | Admitting: Cardiology

## 2024-05-25 LAB — CBC
HCT: 34.6 % — ABNORMAL LOW (ref 39.0–52.0)
Hemoglobin: 11.1 g/dL — ABNORMAL LOW (ref 13.0–17.0)
MCH: 29.6 pg (ref 26.0–34.0)
MCHC: 32.1 g/dL (ref 30.0–36.0)
MCV: 92.3 fL (ref 80.0–100.0)
Platelets: 159 10*3/uL (ref 150–400)
RBC: 3.75 MIL/uL — ABNORMAL LOW (ref 4.22–5.81)
RDW: 14.5 % (ref 11.5–15.5)
WBC: 4.6 10*3/uL (ref 4.0–10.5)
nRBC: 0 % (ref 0.0–0.2)

## 2024-05-25 LAB — BASIC METABOLIC PANEL WITH GFR
Anion gap: 16 — ABNORMAL HIGH (ref 5–15)
BUN: 61 mg/dL — ABNORMAL HIGH (ref 8–23)
CO2: 26 mmol/L (ref 22–32)
Calcium: 9.5 mg/dL (ref 8.9–10.3)
Chloride: 95 mmol/L — ABNORMAL LOW (ref 98–111)
Creatinine, Ser: 9.17 mg/dL — ABNORMAL HIGH (ref 0.61–1.24)
GFR, Estimated: 6 mL/min — ABNORMAL LOW
Glucose, Bld: 95 mg/dL (ref 70–99)
Potassium: 4.3 mmol/L (ref 3.5–5.1)
Sodium: 137 mmol/L (ref 135–145)

## 2024-05-25 LAB — LIPOPROTEIN A (LPA): Lipoprotein (a): 82.7 nmol/L — ABNORMAL HIGH

## 2024-05-25 NOTE — Progress Notes (Signed)
 Per pt, his normal outpatient HD schedule is M/W/F at 06:30. Gordy Bergamo, MD showing active on secure chat so message sent to [facility], MD regarding pt requesting completion of HD before discharge today.

## 2024-05-25 NOTE — Care Management Obs Status (Signed)
 MEDICARE OBSERVATION STATUS NOTIFICATION   Patient Details  Name: [name] [name] MRN: [id number] Date of Birth: [birth year]   Medicare Observation Status Notification Given:  Yes    Vonzell Arrie Sharps 05/25/2024, 9:18 AM

## 2024-05-25 NOTE — Progress Notes (Addendum)
 CARDIAC REHAB PHASE I     Post stent education including site care, restrictions, risk factors, exercise guidelines, NTG use, antiplatelet therapy importance, heart healthy diabetic diet, and CRP2 reviewed. Patient states he has ambulated up and down the hallway today without CP or DOE. All questions and concerns addressed. Will refer to AP for CRP2. Plan for home later today.    8:45-9:08 Isaiah JAYSON Liverpool, RN BSN 05/25/2024 9:09 AM

## 2024-05-25 NOTE — Plan of Care (Signed)
" °  Problem: Education: Goal: Understanding of CV disease, CV risk reduction, and recovery process will improve Outcome: Adequate for Discharge Goal: Individualized Educational Video(s) Outcome: Adequate for Discharge   Problem: Activity: Goal: Ability to return to baseline activity level will improve Outcome: Adequate for Discharge   Problem: Cardiovascular: Goal: Ability to achieve and maintain adequate cardiovascular perfusion will improve Outcome: Adequate for Discharge Goal: Vascular access site(s) Level 0-1 will be maintained Outcome: Adequate for Discharge   Problem: Health Behavior/Discharge Planning: Goal: Ability to safely manage health-related needs after discharge will improve Outcome: Adequate for Discharge   Problem: Health Behavior/Discharge Planning: Goal: Ability to safely manage health-related needs after discharge will improve Outcome: Adequate for Discharge   Problem: Education: Goal: Knowledge of General Education information will improve Description: Including pain rating scale, medication(s)/side effects and non-pharmacologic comfort measures Outcome: Adequate for Discharge   Problem: Health Behavior/Discharge Planning: Goal: Ability to manage health-related needs will improve Outcome: Adequate for Discharge   Problem: Clinical Measurements: Goal: Ability to maintain clinical measurements within normal limits will improve Outcome: Adequate for Discharge Goal: Will remain free from infection Outcome: Adequate for Discharge Goal: Diagnostic test results will improve Outcome: Adequate for Discharge Goal: Respiratory complications will improve Outcome: Adequate for Discharge Goal: Cardiovascular complication will be avoided Outcome: Adequate for Discharge   Problem: Activity: Goal: Risk for activity intolerance will decrease Outcome: Adequate for Discharge   Problem: Nutrition: Goal: Adequate nutrition will be maintained Outcome: Adequate for  Discharge   Problem: Coping: Goal: Level of anxiety will decrease Outcome: Adequate for Discharge   Problem: Elimination: Goal: Will not experience complications related to bowel motility Outcome: Adequate for Discharge Goal: Will not experience complications related to urinary retention Outcome: Adequate for Discharge   Problem: Pain Managment: Goal: General experience of comfort will improve and/or be controlled Outcome: Adequate for Discharge   Problem: Safety: Goal: Ability to remain free from injury will improve Outcome: Adequate for Discharge   Problem: Skin Integrity: Goal: Risk for impaired skin integrity will decrease Outcome: Adequate for Discharge   "

## 2024-05-25 NOTE — TOC CM/SW Note (Signed)
 05-25-24 1000 ICM received notification from Staff RN [place] that the patient has coordinated outpatient HD with his center in [place] for Sat. No further needs identified at this time.

## 2024-05-25 NOTE — Discharge Summary (Addendum)
 " Discharge Summary   Patient ID: [name] [name] MRN: [id number]; DOB: June 14, 1950  Admit date: 05/24/2024 Discharge date: 05/25/2024  PCP:  Tobie Suzzane POUR, MD   [facility] Providers Cardiologist:  Diannah SHAUNNA Maywood, MD     Discharge Diagnoses  Principal Problem:   Post PTCA   Diagnostic Studies/Procedures   Coronary angioplasty 05/24/2024: Successful shockwave IVL 3.5 x 12 mm balloon angioplasty followed by stenting of the ostial and proximal LAD overlapping mid LAD stent (3.0 x 18 mm Onyx frontier DES on 05/26/21) with implantation of a 3.0 x 16 mm Synergy XD DES, IVUS guided proximal/ostial LAD balloon angioplasty with a 4.0 x 12 mm [place] balloon.  90% stenosis to 0% stenosis.  Although stent appears to be deformed, it is well opposed with a minimum lumen area of 6.99 mm and completely expanded against a calcific plaque.  Maximum lumen area measured 8.85 mm in the ostial and mid LAD.    Recommendation: Aspirin  indefinitely, Plavix  for 6 months.  Discharge home in the morning. _____________   History of Present Illness   [name] [name] is a 74 y.o. male with PMHx of CAD s/p DES to LAD, HFimpEF, HTN, 11% PVC burden, MGUS, ESRD HD since 2022 on MWF (currently undergoing kidney transplant evaluation at [facility]), CVA 03/2024 who was seen in the office for follow up on 1/28.   Seen in heartcare 04/03/2024 by Dr. Mallipeddi for follow-up.  Patient was asymptomatic at that time, however scheduled for LHC as part of preop evaluation for kidney transplant due to abnormal stress test.  Also ordered 2-week live ZIO, which is still pending.  Continued on Lipitor  80 mg daily, Plavix  75 mg daily, metoprolol  XL 25 mg twice daily, midodrine  5 mg every other day, NTG as needed.   Outpatient cath 04/17/2024: High-risk CAD with 90% calcific ostial/proximal LAD stenosis and 90% ostial OM1 stenosis; also prior mid LAD DES with 30-40% ISR and mild RCA disease; no intervention at that time, CABG vs  PCI discussed, and per Heart Team 04/20/2024, anatomy and patient preference favored PCI to LAD only with medical management of OM after recovery from CVA, with recommendation for DAPT >=6 months.   Hospitalized 04/18/2024-04/24/2024 and diagnosed with acute left internal capsule CVA.  He was not a candidate for tPA due to recent cardiac cath.  Echo 04/19/2024 was normal as above.  Continued on ASA 81 mg daily, Lipitor  80 mg daily, Plavix  75 mg daily, Toprol -XL 25 mg daily, midodrine  5 mg every other day before HD, NTG as needed.   Seen in the clinic on 1/28 for follow up and was doing well. Cardiac catheterization with discussed and arranged outpatient.   Hospital Course    Presented for outpatient cardiac catheterization on 2/5 and underwent successful shockwave lithotripsy balloon angioplasty and stenting of the ostial/proximal LAD overlapping prior mid LAD stent.  Stent did not appear to be deformed but was well opposed with minimal lumen area of 6.99 mm and completely expanded.  Recommendations for DAPT with aspirin /Plavix  for at least 6 months and aspirin  indefinitely.  No complications noted overnight.  No chest pain the following morning.  Of note patient is on Monday Wednesday Friday dialysis schedule.  Spoke with dialysis center this morning and they will be able to work patient in for session later today.  General: Well developed, well nourished, male appearing in no acute distress. Head: Normocephalic, atraumatic.  Neck: Supple without bruits, JVD. Lungs:  Resp regular and unlabored, CTA. Heart: RRR,  S1, S2, no S3, S4, or murmur; no rub. Abdomen: Soft, non-tender. Extremities: No clubbing, cyanosis, edema. Distal pedal pulses are 2+ bilaterally. Right radial cath site stable without bruising or hematoma Neuro: Alert and oriented X 3. Moves all extremities spontaneously. Psych: Normal affect.  Patient was seen by myself and Dr. Anner and deemed stable for discharge home. Follow up  arranged in the office.   Did the patient have an acute coronary syndrome (MI, NSTEMI, STEMI, etc) this admission?:  No                               Did the patient have a percutaneous coronary intervention (stent / angioplasty)?:  Yes.     Cath/PCI Registry Performance & Quality Measures: Aspirin  prescribed? - Yes ADP Receptor Inhibitor (Plavix /Clopidogrel , Brilinta /Ticagrelor  or Effient/Prasugrel) prescribed (includes medically managed patients)? - Yes High Intensity Statin (Lipitor  40-80mg  or Crestor  20-40mg ) prescribed? - Yes For EF <40%, was ACEI/ARB prescribed? - Not Applicable (EF >/= 40%) For EF <40%, Aldosterone Antagonist (Spironolactone or Eplerenone) prescribed? - Not Applicable (EF >/= 40%) Cardiac Rehab Phase II ordered? - Yes       The patient will be scheduled for a TOC follow up appointment in 10-14 days.  A message has been sent to the [facility] and Scheduling Pool at the office where the patient should be seen for follow up.  _____________  Discharge Vitals Blood pressure (!) 149/69, pulse (!) 55, temperature 98.2 F (36.8 C), temperature source Oral, resp. rate 17, height 6' (1.829 m), weight 99.8 kg, SpO2 100%.  Filed Weights   05/24/24 0853  Weight: 99.8 kg    Labs & Radiologic Studies  CBC Recent Labs    05/25/24 0450  WBC 4.6  HGB 11.1*  HCT 34.6*  MCV 92.3  PLT 159   Basic Metabolic Panel Recent Labs    97/93/73 0450  NA 137  K 4.3  CL 95*  CO2 26  GLUCOSE 95  BUN 61*  CREATININE 9.17*  CALCIUM  9.5   Liver Function Tests No results for input(s): AST, ALT, ALKPHOS, BILITOT, PROT, ALBUMIN in the last 72 hours. No results for input(s): LIPASE, AMYLASE in the last 72 hours. High Sensitivity Troponin:   No results for input(s): TROPONINIHS in the last 720 hours.  No results for input(s): TRNPT in the last 720 hours.  BNP Invalid input(s): POCBNP No results for input(s): PROBNP in the last 72 hours.  No results for  input(s): BNP in the last 72 hours.  D-Dimer No results for input(s): DDIMER in the last 72 hours. Hemoglobin A1C No results for input(s): HGBA1C in the last 72 hours. Fasting Lipid Panel No results for input(s): CHOL, HDL, LDLCALC, TRIG, CHOLHDL, LDLDIRECT in the last 72 hours. No results found for: LIPOA  Thyroid  Function Tests No results for input(s): TSH, T4TOTAL, T3FREE, THYROIDAB in the last 72 hours.  Invalid input(s): FREET3 _____________  CARDIAC CATHETERIZATION Result Date: 05/24/2024 Images from the original result were not included. Coronary angioplasty 05/24/2024: Successful shockwave IVL 3.5 x 12 mm balloon angioplasty followed by stenting of the ostial and proximal LAD overlapping mid LAD stent (3.0 x 18 mm Onyx frontier DES on 05/26/21) with implantation of a 3.0 x 16 mm Synergy XD DES, IVUS guided proximal/ostial LAD balloon angioplasty with a 4.0 x 12 mm [place] balloon.  90% stenosis to 0% stenosis.  Although stent appears to be deformed, it is well opposed with  a minimum lumen area of 6.99 mm and completely expanded against a calcific plaque.  Maximum lumen area measured 8.85 mm in the ostial and mid LAD. Recommendation: Aspirin  indefinitely, Plavix  for 6 months.  Discharge home in the morning.    Disposition Pt is being discharged home today in good condition.  Follow-up Plans & Appointments  Discharge Instructions     AMB Referral to Cardiac Rehabilitation - Phase II   Complete by: As directed    Diagnosis:  Coronary Stents Stable Angina     After initial evaluation and assessments completed: Virtual Based Care may be provided alone or in conjunction with Phase 2 Cardiac Rehab based on patient barriers.: Yes   Intensive Cardiac Rehabilitation (ICR) [facility] location only OR Traditional Cardiac Rehabilitation (TCR) *If criteria for ICR are not met will enroll in TCR [facility] only): Yes   Call MD for:  difficulty breathing, headache or visual  disturbances   Complete by: As directed    Call MD for:  persistant dizziness or light-headedness   Complete by: As directed    Call MD for:  redness, tenderness, or signs of infection (pain, swelling, redness, odor or green/yellow discharge around incision site)   Complete by: As directed    Discharge instructions   Complete by: As directed    Groin Site Care Refer to this sheet in the next few weeks. These instructions provide you with information on caring for yourself after your procedure. Your caregiver may also give you more specific instructions. Your treatment has been planned according to current medical practices, but problems sometimes occur. Call your caregiver if you have any problems or questions after your procedure. HOME CARE INSTRUCTIONS You may shower 24 hours after the procedure. Remove the bandage (dressing) and gently wash the site with plain soap and water . Gently pat the site dry.  Do not apply powder or lotion to the site.  Do not sit in a bathtub, swimming pool, or whirlpool for 5 to 7 days.  No bending, squatting, or lifting anything over 10 pounds (4.5 kg) as directed by your caregiver.  Inspect the site at least twice daily.  Do not drive home if you are discharged the same day of the procedure. Have someone else drive you.  You may drive 24 hours after the procedure unless otherwise instructed by your caregiver.  What to expect: Any bruising will usually fade within 1 to 2 weeks.  Blood that collects in the tissue (hematoma) may be painful to the touch. It should usually decrease in size and tenderness within 1 to 2 weeks.  SEEK IMMEDIATE MEDICAL CARE IF: You have unusual pain at the groin site or down the affected leg.  You have redness, warmth, swelling, or pain at the groin site.  You have drainage (other than a small amount of blood on the dressing).  You have chills.  You have a fever or persistent symptoms for more than 72 hours.  You have a fever and  your symptoms suddenly get worse.  Your leg becomes pale, cool, tingly, or numb.  You have heavy bleeding from the site. Hold pressure on the site. SABRA  PLEASE DO NOT MISS ANY DOSES OF YOUR PLAVIX !!!!! Also keep a log of you blood pressures and bring back to your follow up appt. Please call the office with any questions.   Patients taking blood thinners should generally stay away from medicines like ibuprofen, Advil, Motrin, naproxen, and Aleve due to risk of stomach bleeding. You may  take Tylenol  as directed or talk to your primary doctor about alternatives.  Some studies suggest Prilosec/Omeprazole interacts with Plavix . We changed your Prilosec/Omeprazole to the equivalent dose of Protonix  for less chance of interaction.  PLEASE ENSURE THAT YOU DO NOT RUN OUT OF YOUR PLAVIX . This medication is very important to remain on for at least 92month. IF you have issues obtaining this medication due to cost please CALL the office 3-5 business days prior to running out in order to prevent missing doses of this medication.   Increase activity slowly   Complete by: As directed        Discharge Medications Allergies as of 05/25/2024   No Known Allergies      Medication List     TAKE these medications    albuterol  108 (90 Base) MCG/ACT inhaler Commonly known as: VENTOLIN  HFA Inhale 1-2 puffs into the lungs every 6 (six) hours as needed for wheezing or shortness of breath.   aspirin  EC 81 MG tablet Take 81 mg by mouth daily. Swallow whole.   atorvastatin  80 MG tablet Commonly known as: LIPITOR  Take 1 tablet (80 mg total) by mouth daily.   Breo Ellipta  100-25 MCG/ACT Aepb Generic drug: fluticasone  furoate-vilanterol Inhale 1 puff into the lungs daily as needed.   Breyna  160-4.5 MCG/ACT inhaler Generic drug: budesonide -formoterol  Inhale 2 puffs into the lungs 2 (two) times daily.   clopidogrel  75 MG tablet Commonly known as: PLAVIX  Take 1 tablet (75 mg total) by mouth daily.    metoprolol  succinate 25 MG 24 hr tablet Commonly known as: TOPROL -XL Take 1 tablet (25 mg total) by mouth daily. What changed: when to take this   midodrine  5 MG tablet Commonly known as: PROAMATINE  Take 5 mg by mouth every Monday, Wednesday, and Friday. Before HD   nitroGLYCERIN  0.4 MG SL tablet Commonly known as: NITROSTAT  Place 1 tablet (0.4 mg total) under the tongue every 5 (five) minutes as needed for chest pain.   pantoprazole  40 MG tablet Commonly known as: PROTONIX  Take 1 tablet (40 mg total) by mouth daily.   sevelamer  carbonate 800 MG tablet Commonly known as: RENVELA  Take 1 tablet (800 mg total) by mouth 3 (three) times daily with meals.   Xphozah 30 MG Tabs Generic drug: Tenapanor HCl (CKD) Take 30 mg by mouth 2 (two) times daily. Takes when he eats         Outstanding Labs/Studies  N/a   Duration of Discharge Encounter: APP Time: 15 minutes   Signed, Manuelita Rummer, NP 05/25/2024, 8:31 AM   ATTENDING ATTESTATION  I have seen, examined and evaluated the patient this morning on rounds along with with 0 hours s, NP.  After reviewing all the available data and chart, we discussed the patients laboratory, study & physical findings as well as symptoms in detail.  We also discussed general summary of the patient's hospitalization along with pertinent findings, impressions and recommendations.  I agree with the findings, examination, impression recommendations and hospital summary as noted above.  Did well overnight with no angina.  Cath films reviewed.  Stable for discharge on DAPT with ASA/Plavix .  On low-dose beta-blocker, but no other BP medications as he has midodrine  also ordered. Continue high-dose statin.    MD time:.  10 minutes with patient, 15 minutes reviewing Films, history and charting-25 minutes  Total MD plus APP time 40 minutes    Alm MICAEL Clay, MD, MS Alm Clay, MD., M.S. Interventional Cardiologist  [facility] #  [phone] Phone # [phone] [address] [place], [place] [address]    "

## 2024-06-21 ENCOUNTER — Ambulatory Visit: Admitting: Physician Assistant

## 2024-07-05 ENCOUNTER — Encounter: Admitting: Internal Medicine

## 2024-07-30 ENCOUNTER — Ambulatory Visit

## 2024-07-31 ENCOUNTER — Ambulatory Visit
